# Patient Record
Sex: Female | Born: 1960 | Race: White | Hispanic: No | Marital: Married | Smoking: Former smoker
Health system: Southern US, Community
[De-identification: ages and names within clinical notes are randomized; demographics above are authoritative.]

## PROBLEM LIST (undated history)

## (undated) DIAGNOSIS — E669 Obesity, unspecified: Secondary | ICD-10-CM

## (undated) DIAGNOSIS — Z8052 Family history of malignant neoplasm of bladder: Secondary | ICD-10-CM

## (undated) DIAGNOSIS — R202 Paresthesia of skin: Secondary | ICD-10-CM

## (undated) DIAGNOSIS — G8929 Other chronic pain: Secondary | ICD-10-CM

## (undated) DIAGNOSIS — C801 Malignant (primary) neoplasm, unspecified: Secondary | ICD-10-CM

## (undated) DIAGNOSIS — R252 Cramp and spasm: Secondary | ICD-10-CM

## (undated) DIAGNOSIS — N809 Endometriosis, unspecified: Secondary | ICD-10-CM

## (undated) DIAGNOSIS — M79606 Pain in leg, unspecified: Secondary | ICD-10-CM

## (undated) DIAGNOSIS — D6959 Other secondary thrombocytopenia: Secondary | ICD-10-CM

## (undated) DIAGNOSIS — Z803 Family history of malignant neoplasm of breast: Secondary | ICD-10-CM

## (undated) DIAGNOSIS — G549 Nerve root and plexus disorder, unspecified: Secondary | ICD-10-CM

## (undated) DIAGNOSIS — C50919 Malignant neoplasm of unspecified site of unspecified female breast: Secondary | ICD-10-CM

## (undated) HISTORY — DX: Pain in leg, unspecified: M79.606

## (undated) HISTORY — DX: Cramp and spasm: R25.2

## (undated) HISTORY — DX: Obesity, unspecified: E66.9

## (undated) HISTORY — DX: Family history of malignant neoplasm of breast: Z80.3

## (undated) HISTORY — PX: LAPAROSCOPY: SHX197

## (undated) HISTORY — DX: Nerve root and plexus disorder, unspecified: G54.9

## (undated) HISTORY — DX: Endometriosis, unspecified: N80.9

## (undated) HISTORY — DX: Other secondary thrombocytopenia: D69.59

## (undated) HISTORY — DX: Family history of malignant neoplasm of bladder: Z80.52

## (undated) HISTORY — DX: Paresthesia of skin: R20.2

## (undated) HISTORY — DX: Other chronic pain: G89.29

---

## 1993-01-07 HISTORY — PX: HERNIA REPAIR: SHX51

## 1994-01-07 HISTORY — PX: HERNIA REPAIR: SHX51

## 1997-07-29 ENCOUNTER — Ambulatory Visit (HOSPITAL_COMMUNITY): Admission: RE | Admit: 1997-07-29 | Discharge: 1997-07-29 | Payer: Self-pay | Admitting: Obstetrics and Gynecology

## 1998-05-08 ENCOUNTER — Other Ambulatory Visit: Admission: RE | Admit: 1998-05-08 | Discharge: 1998-05-08 | Payer: Self-pay | Admitting: Gynecology

## 1999-08-10 ENCOUNTER — Encounter (INDEPENDENT_AMBULATORY_CARE_PROVIDER_SITE_OTHER): Payer: Self-pay | Admitting: Specialist

## 1999-08-10 ENCOUNTER — Other Ambulatory Visit: Admission: RE | Admit: 1999-08-10 | Discharge: 1999-08-10 | Payer: Self-pay | Admitting: Gynecology

## 1999-11-08 ENCOUNTER — Other Ambulatory Visit: Admission: RE | Admit: 1999-11-08 | Discharge: 1999-11-08 | Payer: Self-pay | Admitting: Gynecology

## 2000-10-06 ENCOUNTER — Encounter: Admission: RE | Admit: 2000-10-06 | Discharge: 2000-10-06 | Payer: Self-pay | Admitting: Specialist

## 2000-10-06 ENCOUNTER — Encounter: Payer: Self-pay | Admitting: Specialist

## 2001-04-08 ENCOUNTER — Other Ambulatory Visit: Admission: RE | Admit: 2001-04-08 | Discharge: 2001-04-08 | Payer: Self-pay | Admitting: Family Medicine

## 2004-02-21 ENCOUNTER — Ambulatory Visit: Payer: Self-pay | Admitting: Family Medicine

## 2004-02-21 ENCOUNTER — Other Ambulatory Visit: Admission: RE | Admit: 2004-02-21 | Discharge: 2004-02-21 | Payer: Self-pay | Admitting: Family Medicine

## 2004-03-15 ENCOUNTER — Ambulatory Visit: Payer: Self-pay | Admitting: Family Medicine

## 2004-11-09 ENCOUNTER — Ambulatory Visit: Payer: Self-pay | Admitting: Family Medicine

## 2004-11-12 ENCOUNTER — Encounter: Admission: RE | Admit: 2004-11-12 | Discharge: 2004-11-12 | Payer: Self-pay | Admitting: Family Medicine

## 2005-01-29 ENCOUNTER — Ambulatory Visit: Payer: Self-pay | Admitting: Family Medicine

## 2005-02-07 ENCOUNTER — Ambulatory Visit: Payer: Self-pay | Admitting: Family Medicine

## 2006-09-24 ENCOUNTER — Encounter: Admission: RE | Admit: 2006-09-24 | Discharge: 2006-09-24 | Payer: Self-pay | Admitting: Obstetrics & Gynecology

## 2007-04-06 ENCOUNTER — Ambulatory Visit: Payer: Self-pay | Admitting: Family Medicine

## 2007-04-06 DIAGNOSIS — J111 Influenza due to unidentified influenza virus with other respiratory manifestations: Secondary | ICD-10-CM | POA: Insufficient documentation

## 2007-04-06 LAB — CONVERTED CEMR LAB: Inflenza A Ag: POSITIVE

## 2007-04-07 ENCOUNTER — Telehealth (INDEPENDENT_AMBULATORY_CARE_PROVIDER_SITE_OTHER): Payer: Self-pay | Admitting: *Deleted

## 2007-04-15 ENCOUNTER — Ambulatory Visit: Payer: Self-pay | Admitting: Internal Medicine

## 2007-04-15 DIAGNOSIS — J019 Acute sinusitis, unspecified: Secondary | ICD-10-CM

## 2007-10-02 ENCOUNTER — Encounter: Admission: RE | Admit: 2007-10-02 | Discharge: 2007-10-02 | Payer: Self-pay | Admitting: Obstetrics & Gynecology

## 2007-10-19 ENCOUNTER — Encounter: Payer: Self-pay | Admitting: Family Medicine

## 2007-10-26 ENCOUNTER — Encounter: Payer: Self-pay | Admitting: Family Medicine

## 2008-01-08 HISTORY — PX: BREAST LUMPECTOMY: SHX2

## 2008-01-27 ENCOUNTER — Encounter: Payer: Self-pay | Admitting: Family Medicine

## 2008-02-03 ENCOUNTER — Ambulatory Visit: Payer: Self-pay | Admitting: Family Medicine

## 2008-02-03 DIAGNOSIS — B070 Plantar wart: Secondary | ICD-10-CM

## 2008-02-03 DIAGNOSIS — IMO0001 Reserved for inherently not codable concepts without codable children: Secondary | ICD-10-CM

## 2008-02-03 DIAGNOSIS — B009 Herpesviral infection, unspecified: Secondary | ICD-10-CM | POA: Insufficient documentation

## 2008-02-09 ENCOUNTER — Telehealth (INDEPENDENT_AMBULATORY_CARE_PROVIDER_SITE_OTHER): Payer: Self-pay | Admitting: *Deleted

## 2008-02-10 ENCOUNTER — Encounter (INDEPENDENT_AMBULATORY_CARE_PROVIDER_SITE_OTHER): Payer: Self-pay | Admitting: *Deleted

## 2008-02-19 ENCOUNTER — Telehealth (INDEPENDENT_AMBULATORY_CARE_PROVIDER_SITE_OTHER): Payer: Self-pay | Admitting: *Deleted

## 2008-02-19 ENCOUNTER — Encounter: Payer: Self-pay | Admitting: Family Medicine

## 2008-02-22 ENCOUNTER — Telehealth (INDEPENDENT_AMBULATORY_CARE_PROVIDER_SITE_OTHER): Payer: Self-pay | Admitting: *Deleted

## 2008-03-18 ENCOUNTER — Ambulatory Visit: Payer: Self-pay | Admitting: Family Medicine

## 2008-03-20 DIAGNOSIS — D696 Thrombocytopenia, unspecified: Secondary | ICD-10-CM

## 2008-03-20 LAB — CONVERTED CEMR LAB
Basophils Absolute: 0.1 10*3/uL (ref 0.0–0.1)
Eosinophils Absolute: 0.4 10*3/uL (ref 0.0–0.7)
HCT: 39.5 % (ref 36.0–46.0)
MCHC: 33.4 g/dL (ref 30.0–36.0)
MCV: 99.3 fL (ref 78.0–100.0)
Magnesium: 2.2 mg/dL (ref 1.5–2.5)
Monocytes Absolute: 0.2 10*3/uL (ref 0.1–1.0)
Platelets: 117 10*3/uL — ABNORMAL LOW (ref 150–400)
RDW: 11.6 % (ref 11.5–14.6)

## 2008-03-21 LAB — CONVERTED CEMR LAB: Vit D, 25-Hydroxy: 46 ng/mL (ref 30–89)

## 2008-03-22 ENCOUNTER — Telehealth (INDEPENDENT_AMBULATORY_CARE_PROVIDER_SITE_OTHER): Payer: Self-pay | Admitting: *Deleted

## 2008-03-22 ENCOUNTER — Encounter (INDEPENDENT_AMBULATORY_CARE_PROVIDER_SITE_OTHER): Payer: Self-pay | Admitting: *Deleted

## 2008-03-25 ENCOUNTER — Ambulatory Visit: Payer: Self-pay | Admitting: Internal Medicine

## 2008-03-28 ENCOUNTER — Encounter (INDEPENDENT_AMBULATORY_CARE_PROVIDER_SITE_OTHER): Payer: Self-pay | Admitting: *Deleted

## 2008-04-13 ENCOUNTER — Encounter: Payer: Self-pay | Admitting: Family Medicine

## 2008-04-13 LAB — CBC WITH DIFFERENTIAL/PLATELET
BASO%: 0.7 % (ref 0.0–2.0)
Basophils Absolute: 0 10*3/uL (ref 0.0–0.1)
EOS%: 3.8 % (ref 0.0–7.0)
Eosinophils Absolute: 0.2 10*3/uL (ref 0.0–0.5)
HCT: 36 % (ref 34.8–46.6)
HGB: 12.5 g/dL (ref 11.6–15.9)
LYMPH%: 41.5 % (ref 14.0–49.7)
MCH: 32.2 pg (ref 25.1–34.0)
MCHC: 34.7 g/dL (ref 31.5–36.0)
MCV: 92.8 fL (ref 79.5–101.0)
MONO#: 0.3 10*3/uL (ref 0.1–0.9)
MONO%: 5.4 % (ref 0.0–14.0)
NEUT#: 2.8 10*3/uL (ref 1.5–6.5)
NEUT%: 48.6 % (ref 38.4–76.8)
Platelets: 141 10*3/uL — ABNORMAL LOW (ref 145–400)
RBC: 3.88 10*6/uL (ref 3.70–5.45)
RDW: 12 % (ref 11.2–14.5)
WBC: 5.7 10*3/uL (ref 3.9–10.3)
lymph#: 2.4 10*3/uL (ref 0.9–3.3)

## 2008-04-13 LAB — COMPREHENSIVE METABOLIC PANEL
ALT: 12 U/L (ref 0–35)
AST: 13 U/L (ref 0–37)
Albumin: 4 g/dL (ref 3.5–5.2)
Alkaline Phosphatase: 48 U/L (ref 39–117)
BUN: 9 mg/dL (ref 6–23)
CO2: 27 mEq/L (ref 19–32)
Calcium: 8.9 mg/dL (ref 8.4–10.5)
Chloride: 106 mEq/L (ref 96–112)
Creatinine, Ser: 0.79 mg/dL (ref 0.40–1.20)
Glucose, Bld: 89 mg/dL (ref 70–99)
Potassium: 4.2 mEq/L (ref 3.5–5.3)
Sodium: 140 mEq/L (ref 135–145)
Total Bilirubin: 0.5 mg/dL (ref 0.3–1.2)
Total Protein: 6.2 g/dL (ref 6.0–8.3)

## 2008-04-21 ENCOUNTER — Encounter: Admission: RE | Admit: 2008-04-21 | Discharge: 2008-04-21 | Payer: Self-pay | Admitting: Obstetrics & Gynecology

## 2008-04-29 ENCOUNTER — Encounter: Admission: RE | Admit: 2008-04-29 | Discharge: 2008-04-29 | Payer: Self-pay | Admitting: Obstetrics & Gynecology

## 2008-04-29 ENCOUNTER — Encounter (INDEPENDENT_AMBULATORY_CARE_PROVIDER_SITE_OTHER): Payer: Self-pay | Admitting: Obstetrics & Gynecology

## 2008-05-04 ENCOUNTER — Encounter: Admission: RE | Admit: 2008-05-04 | Discharge: 2008-05-04 | Payer: Self-pay | Admitting: Obstetrics & Gynecology

## 2008-05-04 ENCOUNTER — Encounter: Payer: Self-pay | Admitting: Family Medicine

## 2008-05-06 ENCOUNTER — Encounter: Admission: RE | Admit: 2008-05-06 | Discharge: 2008-05-06 | Payer: Self-pay | Admitting: Obstetrics & Gynecology

## 2008-05-06 ENCOUNTER — Encounter: Payer: Self-pay | Admitting: Family Medicine

## 2008-05-07 HISTORY — PX: BREAST SURGERY: SHX581

## 2008-05-07 HISTORY — PX: CHOLECYSTECTOMY: SHX55

## 2008-05-12 ENCOUNTER — Ambulatory Visit: Payer: Self-pay | Admitting: Family Medicine

## 2008-05-12 DIAGNOSIS — C50919 Malignant neoplasm of unspecified site of unspecified female breast: Secondary | ICD-10-CM | POA: Insufficient documentation

## 2008-05-16 ENCOUNTER — Encounter: Admission: RE | Admit: 2008-05-16 | Discharge: 2008-05-16 | Payer: Self-pay | Admitting: General Surgery

## 2008-05-16 ENCOUNTER — Encounter: Payer: Self-pay | Admitting: Family Medicine

## 2008-05-16 ENCOUNTER — Ambulatory Visit (HOSPITAL_BASED_OUTPATIENT_CLINIC_OR_DEPARTMENT_OTHER): Admission: RE | Admit: 2008-05-16 | Discharge: 2008-05-16 | Payer: Self-pay | Admitting: General Surgery

## 2008-05-16 ENCOUNTER — Encounter (INDEPENDENT_AMBULATORY_CARE_PROVIDER_SITE_OTHER): Payer: Self-pay | Admitting: General Surgery

## 2008-05-21 ENCOUNTER — Telehealth: Payer: Self-pay | Admitting: Family Medicine

## 2008-05-23 ENCOUNTER — Encounter: Admission: RE | Admit: 2008-05-23 | Discharge: 2008-05-23 | Payer: Self-pay | Admitting: General Surgery

## 2008-05-23 ENCOUNTER — Ambulatory Visit: Payer: Self-pay | Admitting: Family Medicine

## 2008-05-23 DIAGNOSIS — K8020 Calculus of gallbladder without cholecystitis without obstruction: Secondary | ICD-10-CM | POA: Insufficient documentation

## 2008-05-24 ENCOUNTER — Ambulatory Visit: Payer: Self-pay | Admitting: Genetic Counselor

## 2008-05-26 ENCOUNTER — Encounter: Admission: RE | Admit: 2008-05-26 | Discharge: 2008-05-26 | Payer: Self-pay | Admitting: Neurology

## 2008-06-01 ENCOUNTER — Encounter (INDEPENDENT_AMBULATORY_CARE_PROVIDER_SITE_OTHER): Payer: Self-pay | Admitting: General Surgery

## 2008-06-01 ENCOUNTER — Ambulatory Visit (HOSPITAL_COMMUNITY): Admission: RE | Admit: 2008-06-01 | Discharge: 2008-06-01 | Payer: Self-pay | Admitting: General Surgery

## 2008-06-07 HISTORY — PX: OTHER SURGICAL HISTORY: SHX169

## 2008-06-08 ENCOUNTER — Encounter: Payer: Self-pay | Admitting: Family Medicine

## 2008-06-08 ENCOUNTER — Encounter: Payer: Self-pay | Admitting: Internal Medicine

## 2008-06-08 ENCOUNTER — Ambulatory Visit: Payer: Self-pay | Admitting: Oncology

## 2008-06-08 LAB — COMPREHENSIVE METABOLIC PANEL
ALT: 23 U/L (ref 0–35)
AST: 20 U/L (ref 0–37)
Albumin: 4 g/dL (ref 3.5–5.2)
CO2: 33 mEq/L — ABNORMAL HIGH (ref 19–32)
Calcium: 9.3 mg/dL (ref 8.4–10.5)
Chloride: 105 mEq/L (ref 96–112)
Potassium: 4 mEq/L (ref 3.5–5.3)
Total Protein: 6.8 g/dL (ref 6.0–8.3)

## 2008-06-09 LAB — CANCER ANTIGEN 27.29: CA 27.29: 34 U/mL (ref 0–39)

## 2008-06-14 ENCOUNTER — Encounter: Payer: Self-pay | Admitting: Oncology

## 2008-06-14 ENCOUNTER — Ambulatory Visit: Admission: RE | Admit: 2008-06-14 | Discharge: 2008-06-14 | Payer: Self-pay | Admitting: Oncology

## 2008-06-14 ENCOUNTER — Encounter: Payer: Self-pay | Admitting: Family Medicine

## 2008-06-14 ENCOUNTER — Encounter: Admission: RE | Admit: 2008-06-14 | Discharge: 2008-06-14 | Payer: Self-pay | Admitting: Oncology

## 2008-06-16 ENCOUNTER — Ambulatory Visit (HOSPITAL_COMMUNITY): Admission: RE | Admit: 2008-06-16 | Discharge: 2008-06-16 | Payer: Self-pay | Admitting: General Surgery

## 2008-06-27 DIAGNOSIS — R109 Unspecified abdominal pain: Secondary | ICD-10-CM | POA: Insufficient documentation

## 2008-06-27 DIAGNOSIS — D259 Leiomyoma of uterus, unspecified: Secondary | ICD-10-CM | POA: Insufficient documentation

## 2008-06-30 ENCOUNTER — Ambulatory Visit (HOSPITAL_COMMUNITY): Admission: RE | Admit: 2008-06-30 | Discharge: 2008-06-30 | Payer: Self-pay | Admitting: Obstetrics & Gynecology

## 2008-06-30 ENCOUNTER — Encounter (INDEPENDENT_AMBULATORY_CARE_PROVIDER_SITE_OTHER): Payer: Self-pay | Admitting: Obstetrics & Gynecology

## 2008-07-01 ENCOUNTER — Ambulatory Visit: Payer: Self-pay | Admitting: Internal Medicine

## 2008-07-01 DIAGNOSIS — F411 Generalized anxiety disorder: Secondary | ICD-10-CM | POA: Insufficient documentation

## 2008-07-06 ENCOUNTER — Encounter: Admission: RE | Admit: 2008-07-06 | Discharge: 2008-07-06 | Payer: Self-pay | Admitting: Neurology

## 2008-07-07 LAB — CBC WITH DIFFERENTIAL/PLATELET
BASO%: 0.6 % (ref 0.0–2.0)
Eosinophils Absolute: 0.3 10*3/uL (ref 0.0–0.5)
LYMPH%: 19.2 % (ref 14.0–49.7)
MCHC: 34.6 g/dL (ref 31.5–36.0)
MONO#: 0.6 10*3/uL (ref 0.1–0.9)
NEUT#: 6.3 10*3/uL (ref 1.5–6.5)
Platelets: 195 10*3/uL (ref 145–400)
RBC: 4.23 10*6/uL (ref 3.70–5.45)
RDW: 12 % (ref 11.2–14.5)
WBC: 9 10*3/uL (ref 3.9–10.3)

## 2008-07-07 LAB — COMPREHENSIVE METABOLIC PANEL
ALT: 20 U/L (ref 0–35)
Albumin: 4.1 g/dL (ref 3.5–5.2)
Alkaline Phosphatase: 72 U/L (ref 39–117)
CO2: 29 mEq/L (ref 19–32)
Glucose, Bld: 99 mg/dL (ref 70–99)
Potassium: 4 mEq/L (ref 3.5–5.3)
Sodium: 136 mEq/L (ref 135–145)
Total Bilirubin: 0.5 mg/dL (ref 0.3–1.2)
Total Protein: 7 g/dL (ref 6.0–8.3)

## 2008-07-07 LAB — CANCER ANTIGEN 27.29: CA 27.29: 35 U/mL (ref 0–39)

## 2008-07-08 ENCOUNTER — Ambulatory Visit: Payer: Self-pay | Admitting: Oncology

## 2008-07-12 ENCOUNTER — Emergency Department (HOSPITAL_COMMUNITY): Admission: EM | Admit: 2008-07-12 | Discharge: 2008-07-12 | Payer: Self-pay | Admitting: Emergency Medicine

## 2008-07-13 ENCOUNTER — Encounter: Payer: Self-pay | Admitting: Internal Medicine

## 2008-07-20 ENCOUNTER — Encounter: Payer: Self-pay | Admitting: Internal Medicine

## 2008-07-20 ENCOUNTER — Ambulatory Visit: Payer: Self-pay | Admitting: Psychiatry

## 2008-07-20 LAB — CBC WITH DIFFERENTIAL/PLATELET
BASO%: 0.2 % (ref 0.0–2.0)
EOS%: 1.7 % (ref 0.0–7.0)
HCT: 39.1 % (ref 34.8–46.6)
LYMPH%: 11.2 % — ABNORMAL LOW (ref 14.0–49.7)
MCH: 32.4 pg (ref 25.1–34.0)
MCHC: 33.5 g/dL (ref 31.5–36.0)
NEUT%: 83.8 % — ABNORMAL HIGH (ref 38.4–76.8)
Platelets: 189 10*3/uL (ref 145–400)
RBC: 4.04 10*6/uL (ref 3.70–5.45)

## 2008-07-21 ENCOUNTER — Ambulatory Visit: Payer: Self-pay | Admitting: Psychiatry

## 2008-08-02 ENCOUNTER — Ambulatory Visit: Payer: Self-pay | Admitting: Psychiatry

## 2008-08-03 ENCOUNTER — Encounter: Payer: Self-pay | Admitting: Internal Medicine

## 2008-08-03 LAB — CBC WITH DIFFERENTIAL/PLATELET
BASO%: 0.7 % (ref 0.0–2.0)
Basophils Absolute: 0 10e3/uL (ref 0.0–0.1)
EOS%: 0.6 % (ref 0.0–7.0)
Eosinophils Absolute: 0 10e3/uL (ref 0.0–0.5)
HCT: 34.8 % (ref 34.8–46.6)
HGB: 12 g/dL (ref 11.6–15.9)
LYMPH%: 23.7 % (ref 14.0–49.7)
MCH: 33.2 pg (ref 25.1–34.0)
MCHC: 34.4 g/dL (ref 31.5–36.0)
MCV: 96.4 fL (ref 79.5–101.0)
MONO#: 0.5 10e3/uL (ref 0.1–0.9)
MONO%: 7.7 % (ref 0.0–14.0)
NEUT#: 4.5 10e3/uL (ref 1.5–6.5)
NEUT%: 67.3 % (ref 38.4–76.8)
Platelets: 140 10e3/uL — ABNORMAL LOW (ref 145–400)
RBC: 3.62 10e6/uL — ABNORMAL LOW (ref 3.70–5.45)
RDW: 12.8 % (ref 11.2–14.5)
WBC: 6.7 10e3/uL (ref 3.9–10.3)
lymph#: 1.6 10e3/uL (ref 0.9–3.3)

## 2008-08-03 LAB — COMPREHENSIVE METABOLIC PANEL
Albumin: 3.8 g/dL (ref 3.5–5.2)
BUN: 11 mg/dL (ref 6–23)
Calcium: 9 mg/dL (ref 8.4–10.5)
Chloride: 103 mEq/L (ref 96–112)
Creatinine, Ser: 0.8 mg/dL (ref 0.40–1.20)
Glucose, Bld: 84 mg/dL (ref 70–99)
Potassium: 4.1 mEq/L (ref 3.5–5.3)

## 2008-08-09 ENCOUNTER — Ambulatory Visit: Payer: Self-pay | Admitting: Psychiatry

## 2008-08-09 ENCOUNTER — Ambulatory Visit: Payer: Self-pay | Admitting: Oncology

## 2008-08-11 LAB — CBC WITH DIFFERENTIAL/PLATELET
BASO%: 0.5 % (ref 0.0–2.0)
LYMPH%: 33.5 % (ref 14.0–49.7)
MCHC: 34.6 g/dL (ref 31.5–36.0)
MONO#: 0.6 10*3/uL (ref 0.1–0.9)
MONO%: 14.9 % — ABNORMAL HIGH (ref 0.0–14.0)
Platelets: 104 10*3/uL — ABNORMAL LOW (ref 145–400)
RBC: 3.31 10*6/uL — ABNORMAL LOW (ref 3.70–5.45)
WBC: 3.9 10*3/uL (ref 3.9–10.3)

## 2008-08-16 ENCOUNTER — Encounter: Payer: Self-pay | Admitting: Family Medicine

## 2008-08-16 ENCOUNTER — Ambulatory Visit: Payer: Self-pay | Admitting: Psychiatry

## 2008-08-23 ENCOUNTER — Ambulatory Visit: Payer: Self-pay | Admitting: Psychiatry

## 2008-08-24 ENCOUNTER — Encounter: Payer: Self-pay | Admitting: Internal Medicine

## 2008-08-24 LAB — COMPREHENSIVE METABOLIC PANEL
ALT: 26 U/L (ref 0–35)
Alkaline Phosphatase: 73 U/L (ref 39–117)
CO2: 25 mEq/L (ref 19–32)
Creatinine, Ser: 0.91 mg/dL (ref 0.40–1.20)
Total Bilirubin: 0.4 mg/dL (ref 0.3–1.2)

## 2008-08-24 LAB — CBC WITH DIFFERENTIAL/PLATELET
BASO%: 0.4 % (ref 0.0–2.0)
HCT: 34.5 % — ABNORMAL LOW (ref 34.8–46.6)
MCHC: 34.1 g/dL (ref 31.5–36.0)
MONO#: 0.3 10*3/uL (ref 0.1–0.9)
RBC: 3.48 10*6/uL — ABNORMAL LOW (ref 3.70–5.45)
WBC: 4.4 10*3/uL (ref 3.9–10.3)
lymph#: 1.2 10*3/uL (ref 0.9–3.3)

## 2008-09-01 ENCOUNTER — Ambulatory Visit: Payer: Self-pay | Admitting: Psychiatry

## 2008-09-01 ENCOUNTER — Encounter: Payer: Self-pay | Admitting: Internal Medicine

## 2008-09-01 LAB — COMPREHENSIVE METABOLIC PANEL
Albumin: 3.7 g/dL (ref 3.5–5.2)
CO2: 30 mEq/L (ref 19–32)
Glucose, Bld: 95 mg/dL (ref 70–99)
Potassium: 3.9 mEq/L (ref 3.5–5.3)
Sodium: 137 mEq/L (ref 135–145)
Total Protein: 6.4 g/dL (ref 6.0–8.3)

## 2008-09-01 LAB — CBC WITH DIFFERENTIAL/PLATELET
BASO%: 0.3 % (ref 0.0–2.0)
Eosinophils Absolute: 0.1 10*3/uL (ref 0.0–0.5)
HCT: 32.8 % — ABNORMAL LOW (ref 34.8–46.6)
LYMPH%: 18.2 % (ref 14.0–49.7)
MCHC: 34.7 g/dL (ref 31.5–36.0)
MCV: 98.8 fL (ref 79.5–101.0)
MONO#: 1.5 10*3/uL — ABNORMAL HIGH (ref 0.1–0.9)
MONO%: 12.7 % (ref 0.0–14.0)
NEUT%: 68.3 % (ref 38.4–76.8)
Platelets: 203 10*3/uL (ref 145–400)
WBC: 11.6 10*3/uL — ABNORMAL HIGH (ref 3.9–10.3)

## 2008-09-08 ENCOUNTER — Ambulatory Visit: Payer: Self-pay | Admitting: Oncology

## 2008-09-08 ENCOUNTER — Encounter: Payer: Self-pay | Admitting: Internal Medicine

## 2008-09-08 LAB — CBC WITH DIFFERENTIAL/PLATELET
BASO%: 0.3 % (ref 0.0–2.0)
Basophils Absolute: 0 10*3/uL (ref 0.0–0.1)
EOS%: 0.1 % (ref 0.0–7.0)
HCT: 33 % — ABNORMAL LOW (ref 34.8–46.6)
LYMPH%: 17.2 % (ref 14.0–49.7)
MCH: 34 pg (ref 25.1–34.0)
MCHC: 34.3 g/dL (ref 31.5–36.0)
MCV: 99.1 fL (ref 79.5–101.0)
NEUT%: 78 % — ABNORMAL HIGH (ref 38.4–76.8)
Platelets: 128 10*3/uL — ABNORMAL LOW (ref 145–400)

## 2008-09-09 LAB — COMPREHENSIVE METABOLIC PANEL
ALT: 27 U/L (ref 0–35)
AST: 27 U/L (ref 0–37)
BUN: 9 mg/dL (ref 6–23)
Creatinine, Ser: 0.89 mg/dL (ref 0.40–1.20)
Total Bilirubin: 0.2 mg/dL — ABNORMAL LOW (ref 0.3–1.2)

## 2008-09-13 ENCOUNTER — Ambulatory Visit: Payer: Self-pay | Admitting: Psychiatry

## 2008-09-15 ENCOUNTER — Encounter: Payer: Self-pay | Admitting: Family Medicine

## 2008-09-15 LAB — CBC WITH DIFFERENTIAL/PLATELET
BASO%: 0.2 % (ref 0.0–2.0)
Basophils Absolute: 0 10*3/uL (ref 0.0–0.1)
HCT: 32.9 % — ABNORMAL LOW (ref 34.8–46.6)
HGB: 11.3 g/dL — ABNORMAL LOW (ref 11.6–15.9)
LYMPH%: 13.2 % — ABNORMAL LOW (ref 14.0–49.7)
MCH: 32.9 pg (ref 25.1–34.0)
MCHC: 34.3 g/dL (ref 31.5–36.0)
MONO#: 0.2 10*3/uL (ref 0.1–0.9)
NEUT%: 83.9 % — ABNORMAL HIGH (ref 38.4–76.8)
Platelets: 142 10*3/uL — ABNORMAL LOW (ref 145–400)
WBC: 8.8 10*3/uL (ref 3.9–10.3)
lymph#: 1.2 10*3/uL (ref 0.9–3.3)

## 2008-09-15 LAB — COMPREHENSIVE METABOLIC PANEL
ALT: 23 U/L (ref 0–35)
BUN: 17 mg/dL (ref 6–23)
CO2: 28 mEq/L (ref 19–32)
Calcium: 9 mg/dL (ref 8.4–10.5)
Chloride: 106 mEq/L (ref 96–112)
Creatinine, Ser: 0.87 mg/dL (ref 0.40–1.20)
Total Bilirubin: 0.5 mg/dL (ref 0.3–1.2)

## 2008-09-19 ENCOUNTER — Encounter: Payer: Self-pay | Admitting: Family Medicine

## 2008-09-21 ENCOUNTER — Encounter: Payer: Self-pay | Admitting: Family Medicine

## 2008-09-21 ENCOUNTER — Encounter: Payer: Self-pay | Admitting: Internal Medicine

## 2008-09-21 LAB — COMPREHENSIVE METABOLIC PANEL
ALT: 27 U/L (ref 0–35)
CO2: 30 mEq/L (ref 19–32)
Calcium: 9.1 mg/dL (ref 8.4–10.5)
Chloride: 103 mEq/L (ref 96–112)
Creatinine, Ser: 0.74 mg/dL (ref 0.40–1.20)
Glucose, Bld: 88 mg/dL (ref 70–99)
Total Bilirubin: 0.6 mg/dL (ref 0.3–1.2)

## 2008-09-21 LAB — CBC WITH DIFFERENTIAL/PLATELET
BASO%: 0.4 % (ref 0.0–2.0)
Basophils Absolute: 0 10*3/uL (ref 0.0–0.1)
Eosinophils Absolute: 0.1 10*3/uL (ref 0.0–0.5)
HCT: 32.2 % — ABNORMAL LOW (ref 34.8–46.6)
HGB: 11.1 g/dL — ABNORMAL LOW (ref 11.6–15.9)
LYMPH%: 26.1 % (ref 14.0–49.7)
MONO#: 0.2 10*3/uL (ref 0.1–0.9)
NEUT#: 3.4 10*3/uL (ref 1.5–6.5)
NEUT%: 68.1 % (ref 38.4–76.8)
Platelets: 76 10*3/uL — ABNORMAL LOW (ref 145–400)
WBC: 5 10*3/uL (ref 3.9–10.3)
lymph#: 1.3 10*3/uL (ref 0.9–3.3)

## 2008-09-27 ENCOUNTER — Ambulatory Visit: Payer: Self-pay | Admitting: Psychiatry

## 2008-09-28 ENCOUNTER — Encounter: Payer: Self-pay | Admitting: Internal Medicine

## 2008-09-28 LAB — COMPREHENSIVE METABOLIC PANEL
ALT: 25 U/L (ref 0–35)
CO2: 30 mEq/L (ref 19–32)
Calcium: 9 mg/dL (ref 8.4–10.5)
Chloride: 105 mEq/L (ref 96–112)
Glucose, Bld: 88 mg/dL (ref 70–99)
Sodium: 140 mEq/L (ref 135–145)
Total Protein: 6.2 g/dL (ref 6.0–8.3)

## 2008-09-28 LAB — CBC WITH DIFFERENTIAL/PLATELET
BASO%: 0.2 % (ref 0.0–2.0)
Eosinophils Absolute: 0 10*3/uL (ref 0.0–0.5)
HCT: 31.9 % — ABNORMAL LOW (ref 34.8–46.6)
MCHC: 34.2 g/dL (ref 31.5–36.0)
MONO#: 0.4 10*3/uL (ref 0.1–0.9)
NEUT#: 6.2 10*3/uL (ref 1.5–6.5)
NEUT%: 75.3 % (ref 38.4–76.8)
RBC: 3.14 10*6/uL — ABNORMAL LOW (ref 3.70–5.45)
WBC: 8.3 10*3/uL (ref 3.9–10.3)
lymph#: 1.6 10*3/uL (ref 0.9–3.3)

## 2008-10-04 ENCOUNTER — Ambulatory Visit: Payer: Self-pay | Admitting: Oncology

## 2008-10-06 ENCOUNTER — Encounter: Payer: Self-pay | Admitting: Internal Medicine

## 2008-10-06 LAB — CBC WITH DIFFERENTIAL/PLATELET
BASO%: 0 % (ref 0.0–2.0)
EOS%: 0.1 % (ref 0.0–7.0)
Eosinophils Absolute: 0 10*3/uL (ref 0.0–0.5)
LYMPH%: 12.3 % — ABNORMAL LOW (ref 14.0–49.7)
MCH: 35.8 pg — ABNORMAL HIGH (ref 25.1–34.0)
MCHC: 34.8 g/dL (ref 31.5–36.0)
MCV: 102.9 fL — ABNORMAL HIGH (ref 79.5–101.0)
MONO%: 2.6 % (ref 0.0–14.0)
Platelets: 140 10*3/uL — ABNORMAL LOW (ref 145–400)
RBC: 3.38 10*6/uL — ABNORMAL LOW (ref 3.70–5.45)

## 2008-10-06 LAB — COMPREHENSIVE METABOLIC PANEL
ALT: 21 U/L (ref 0–35)
AST: 22 U/L (ref 0–37)
Alkaline Phosphatase: 67 U/L (ref 39–117)
Sodium: 137 mEq/L (ref 135–145)
Total Bilirubin: 0.7 mg/dL (ref 0.3–1.2)
Total Protein: 6.7 g/dL (ref 6.0–8.3)

## 2008-10-11 ENCOUNTER — Encounter: Payer: Self-pay | Admitting: Oncology

## 2008-10-11 ENCOUNTER — Ambulatory Visit: Payer: Self-pay | Admitting: Cardiology

## 2008-10-11 ENCOUNTER — Ambulatory Visit (HOSPITAL_COMMUNITY): Admission: RE | Admit: 2008-10-11 | Discharge: 2008-10-11 | Payer: Self-pay | Admitting: Cardiology

## 2008-10-11 ENCOUNTER — Ambulatory Visit: Payer: Self-pay

## 2008-10-12 LAB — CBC WITH DIFFERENTIAL/PLATELET
BASO%: 0.3 % (ref 0.0–2.0)
EOS%: 1.4 % (ref 0.0–7.0)
HCT: 32.4 % — ABNORMAL LOW (ref 34.8–46.6)
LYMPH%: 22.2 % (ref 14.0–49.7)
MCH: 36.3 pg — ABNORMAL HIGH (ref 25.1–34.0)
MCHC: 34.8 g/dL (ref 31.5–36.0)
NEUT%: 67.9 % (ref 38.4–76.8)
Platelets: 152 10*3/uL (ref 145–400)

## 2008-10-12 LAB — COMPREHENSIVE METABOLIC PANEL
ALT: 24 U/L (ref 0–35)
AST: 20 U/L (ref 0–37)
Creatinine, Ser: 0.78 mg/dL (ref 0.40–1.20)
Total Bilirubin: 0.5 mg/dL (ref 0.3–1.2)

## 2008-10-18 ENCOUNTER — Ambulatory Visit: Admission: RE | Admit: 2008-10-18 | Discharge: 2008-12-16 | Payer: Self-pay | Admitting: Radiation Oncology

## 2008-10-18 ENCOUNTER — Ambulatory Visit: Payer: Self-pay | Admitting: Psychiatry

## 2008-10-19 ENCOUNTER — Encounter: Payer: Self-pay | Admitting: Internal Medicine

## 2008-10-19 LAB — CBC WITH DIFFERENTIAL/PLATELET
BASO%: 0.2 % (ref 0.0–2.0)
EOS%: 0.3 % (ref 0.0–7.0)
HCT: 34.5 % — ABNORMAL LOW (ref 34.8–46.6)
LYMPH%: 16.9 % (ref 14.0–49.7)
MCH: 35.5 pg — ABNORMAL HIGH (ref 25.1–34.0)
MCHC: 34 g/dL (ref 31.5–36.0)
MONO#: 0.6 10*3/uL (ref 0.1–0.9)
NEUT%: 78.2 % — ABNORMAL HIGH (ref 38.4–76.8)
Platelets: 221 10*3/uL (ref 145–400)

## 2008-10-25 ENCOUNTER — Ambulatory Visit: Payer: Self-pay | Admitting: Internal Medicine

## 2008-10-27 LAB — COMPREHENSIVE METABOLIC PANEL
ALT: 20 U/L (ref 0–35)
AST: 21 U/L (ref 0–37)
Alkaline Phosphatase: 69 U/L (ref 39–117)
Calcium: 9.1 mg/dL (ref 8.4–10.5)
Chloride: 106 mEq/L (ref 96–112)
Creatinine, Ser: 0.77 mg/dL (ref 0.40–1.20)
Total Bilirubin: 0.6 mg/dL (ref 0.3–1.2)

## 2008-10-27 LAB — CBC WITH DIFFERENTIAL/PLATELET
BASO%: 1.1 % (ref 0.0–2.0)
EOS%: 0.5 % (ref 0.0–7.0)
HCT: 34.4 % — ABNORMAL LOW (ref 34.8–46.6)
MCH: 34.5 pg — ABNORMAL HIGH (ref 25.1–34.0)
MCHC: 33.4 g/dL (ref 31.5–36.0)
MONO%: 9.3 % (ref 0.0–14.0)
NEUT%: 62 % (ref 38.4–76.8)
lymph#: 1.7 10*3/uL (ref 0.9–3.3)

## 2008-11-03 ENCOUNTER — Ambulatory Visit: Payer: Self-pay | Admitting: Family Medicine

## 2008-11-16 ENCOUNTER — Ambulatory Visit: Payer: Self-pay | Admitting: Oncology

## 2008-11-18 LAB — CBC WITH DIFFERENTIAL/PLATELET
BASO%: 1.1 % (ref 0.0–2.0)
HCT: 36.1 % (ref 34.8–46.6)
LYMPH%: 24.6 % (ref 14.0–49.7)
MCHC: 33.8 g/dL (ref 31.5–36.0)
MCV: 103.1 fL — ABNORMAL HIGH (ref 79.5–101.0)
MONO#: 0.3 10*3/uL (ref 0.1–0.9)
NEUT%: 64.3 % (ref 38.4–76.8)
Platelets: 149 10*3/uL (ref 145–400)
WBC: 5.3 10*3/uL (ref 3.9–10.3)

## 2008-11-22 ENCOUNTER — Ambulatory Visit: Payer: Self-pay | Admitting: Psychiatry

## 2008-12-20 ENCOUNTER — Encounter: Payer: Self-pay | Admitting: Internal Medicine

## 2009-01-03 ENCOUNTER — Ambulatory Visit: Payer: Self-pay | Admitting: Oncology

## 2009-01-16 ENCOUNTER — Ambulatory Visit: Payer: Self-pay

## 2009-01-16 ENCOUNTER — Ambulatory Visit (HOSPITAL_COMMUNITY): Admission: RE | Admit: 2009-01-16 | Discharge: 2009-01-16 | Payer: Self-pay | Admitting: Oncology

## 2009-01-16 ENCOUNTER — Ambulatory Visit: Payer: Self-pay | Admitting: Cardiology

## 2009-01-16 ENCOUNTER — Encounter: Payer: Self-pay | Admitting: Oncology

## 2009-01-17 ENCOUNTER — Encounter: Payer: Self-pay | Admitting: Internal Medicine

## 2009-01-17 LAB — CBC WITH DIFFERENTIAL/PLATELET
Basophils Absolute: 0 10*3/uL (ref 0.0–0.1)
EOS%: 2.9 % (ref 0.0–7.0)
HCT: 40.7 % (ref 34.8–46.6)
HGB: 13.9 g/dL (ref 11.6–15.9)
LYMPH%: 25.8 % (ref 14.0–49.7)
MCH: 33.4 pg (ref 25.1–34.0)
MCV: 97.9 fL (ref 79.5–101.0)
MONO%: 7.4 % (ref 0.0–14.0)
NEUT%: 63.3 % (ref 38.4–76.8)
Platelets: 194 10*3/uL (ref 145–400)

## 2009-01-17 LAB — COMPREHENSIVE METABOLIC PANEL
AST: 16 U/L (ref 0–37)
Alkaline Phosphatase: 83 U/L (ref 39–117)
BUN: 13 mg/dL (ref 6–23)
Calcium: 9.3 mg/dL (ref 8.4–10.5)
Chloride: 102 mEq/L (ref 96–112)
Creatinine, Ser: 1.03 mg/dL (ref 0.40–1.20)

## 2009-02-14 ENCOUNTER — Ambulatory Visit: Payer: Self-pay | Admitting: Oncology

## 2009-03-29 ENCOUNTER — Ambulatory Visit: Payer: Self-pay | Admitting: Oncology

## 2009-03-31 ENCOUNTER — Encounter: Payer: Self-pay | Admitting: Family Medicine

## 2009-04-13 ENCOUNTER — Encounter: Payer: Self-pay | Admitting: Oncology

## 2009-04-13 ENCOUNTER — Ambulatory Visit: Payer: Self-pay | Admitting: Cardiology

## 2009-04-13 ENCOUNTER — Ambulatory Visit: Admission: RE | Admit: 2009-04-13 | Discharge: 2009-04-13 | Payer: Self-pay | Admitting: Oncology

## 2009-04-21 LAB — CBC WITH DIFFERENTIAL/PLATELET
BASO%: 0.9 % (ref 0.0–2.0)
EOS%: 3.1 % (ref 0.0–7.0)
HCT: 35.2 % (ref 34.8–46.6)
LYMPH%: 32.6 % (ref 14.0–49.7)
MCH: 32.5 pg (ref 25.1–34.0)
MCHC: 33.8 g/dL (ref 31.5–36.0)
NEUT%: 55.1 % (ref 38.4–76.8)
Platelets: 129 10*3/uL — ABNORMAL LOW (ref 145–400)
RBC: 3.66 10*6/uL — ABNORMAL LOW (ref 3.70–5.45)

## 2009-04-21 LAB — COMPREHENSIVE METABOLIC PANEL
BUN: 14 mg/dL (ref 6–23)
CO2: 26 mEq/L (ref 19–32)
Creatinine, Ser: 0.77 mg/dL (ref 0.40–1.20)
Glucose, Bld: 95 mg/dL (ref 70–99)
Total Bilirubin: 0.3 mg/dL (ref 0.3–1.2)

## 2009-05-01 ENCOUNTER — Encounter: Admission: RE | Admit: 2009-05-01 | Discharge: 2009-05-01 | Payer: Self-pay | Admitting: Radiation Oncology

## 2009-05-02 LAB — ESTRADIOL, ULTRA SENS: Estradiol, Ultra Sensitive: 164 pg/mL

## 2009-05-03 ENCOUNTER — Encounter: Payer: Self-pay | Admitting: Family Medicine

## 2009-05-12 ENCOUNTER — Ambulatory Visit: Payer: Self-pay | Admitting: Oncology

## 2009-05-12 LAB — CBC WITH DIFFERENTIAL/PLATELET
Basophils Absolute: 0 10*3/uL (ref 0.0–0.1)
EOS%: 3.2 % (ref 0.0–7.0)
Eosinophils Absolute: 0.2 10*3/uL (ref 0.0–0.5)
HCT: 34.9 % (ref 34.8–46.6)
HGB: 11.8 g/dL (ref 11.6–15.9)
LYMPH%: 31.1 % (ref 14.0–49.7)
MCH: 33.6 pg (ref 25.1–34.0)
MCV: 99.3 fL (ref 79.5–101.0)
MONO%: 8.4 % (ref 0.0–14.0)
NEUT#: 2.8 10*3/uL (ref 1.5–6.5)
NEUT%: 56.5 % (ref 38.4–76.8)
Platelets: 185 10*3/uL (ref 145–400)
RDW: 12.5 % (ref 11.2–14.5)

## 2009-05-29 ENCOUNTER — Encounter (INDEPENDENT_AMBULATORY_CARE_PROVIDER_SITE_OTHER): Payer: Self-pay | Admitting: *Deleted

## 2009-05-30 ENCOUNTER — Encounter: Payer: Self-pay | Admitting: Internal Medicine

## 2009-05-30 LAB — CBC WITH DIFFERENTIAL/PLATELET
BASO%: 0.5 % (ref 0.0–2.0)
MCHC: 34 g/dL (ref 31.5–36.0)
MONO#: 0.4 10*3/uL (ref 0.1–0.9)
NEUT#: 2.9 10*3/uL (ref 1.5–6.5)
NEUT%: 57.1 % (ref 38.4–76.8)
Platelets: 145 10*3/uL (ref 145–400)
lymph#: 1.6 10*3/uL (ref 0.9–3.3)

## 2009-05-30 LAB — COMPREHENSIVE METABOLIC PANEL
ALT: 19 U/L (ref 0–35)
AST: 22 U/L (ref 0–37)
Calcium: 9.1 mg/dL (ref 8.4–10.5)
Chloride: 101 mEq/L (ref 96–112)
Creatinine, Ser: 0.82 mg/dL (ref 0.40–1.20)
Sodium: 137 mEq/L (ref 135–145)
Total Protein: 6.6 g/dL (ref 6.0–8.3)

## 2009-06-21 ENCOUNTER — Ambulatory Visit: Payer: Self-pay | Admitting: Oncology

## 2009-06-23 ENCOUNTER — Encounter (INDEPENDENT_AMBULATORY_CARE_PROVIDER_SITE_OTHER): Payer: Self-pay

## 2009-06-27 ENCOUNTER — Ambulatory Visit: Payer: Self-pay | Admitting: Internal Medicine

## 2009-07-21 ENCOUNTER — Ambulatory Visit: Payer: Self-pay | Admitting: Oncology

## 2009-07-21 ENCOUNTER — Encounter: Payer: Self-pay | Admitting: Internal Medicine

## 2009-07-21 LAB — CBC WITH DIFFERENTIAL/PLATELET
BASO%: 0.8 % (ref 0.0–2.0)
Basophils Absolute: 0.1 10*3/uL (ref 0.0–0.1)
EOS%: 2.3 % (ref 0.0–7.0)
HCT: 36.3 % (ref 34.8–46.6)
HGB: 12.6 g/dL (ref 11.6–15.9)
MCH: 33.2 pg (ref 25.1–34.0)
MCHC: 34.7 g/dL (ref 31.5–36.0)
MCV: 95.5 fL (ref 79.5–101.0)
MONO%: 6.6 % (ref 0.0–14.0)
NEUT%: 64.1 % (ref 38.4–76.8)
lymph#: 1.6 10*3/uL (ref 0.9–3.3)

## 2009-07-21 LAB — COMPREHENSIVE METABOLIC PANEL
AST: 19 U/L (ref 0–37)
Albumin: 3.9 g/dL (ref 3.5–5.2)
Alkaline Phosphatase: 62 U/L (ref 39–117)
BUN: 15 mg/dL (ref 6–23)
Creatinine, Ser: 0.82 mg/dL (ref 0.40–1.20)
Glucose, Bld: 95 mg/dL (ref 70–99)
Potassium: 4.3 mEq/L (ref 3.5–5.3)
Total Bilirubin: 0.6 mg/dL (ref 0.3–1.2)

## 2009-07-21 LAB — CANCER ANTIGEN 27.29: CA 27.29: 17 U/mL (ref 0–39)

## 2009-08-07 HISTORY — PX: COLONOSCOPY: SHX174

## 2009-08-08 ENCOUNTER — Encounter: Payer: Self-pay | Admitting: Oncology

## 2009-08-08 ENCOUNTER — Ambulatory Visit: Admission: RE | Admit: 2009-08-08 | Discharge: 2009-08-08 | Payer: Self-pay | Admitting: Oncology

## 2009-08-16 LAB — CANCER ANTIGEN 27.29: CA 27.29: 24 U/mL (ref 0–39)

## 2009-08-16 LAB — COMPREHENSIVE METABOLIC PANEL
ALT: 15 U/L (ref 0–35)
Albumin: 4.3 g/dL (ref 3.5–5.2)
Alkaline Phosphatase: 65 U/L (ref 39–117)
CO2: 26 mEq/L (ref 19–32)
Glucose, Bld: 70 mg/dL (ref 70–99)
Potassium: 4.4 mEq/L (ref 3.5–5.3)
Sodium: 140 mEq/L (ref 135–145)
Total Protein: 6.6 g/dL (ref 6.0–8.3)

## 2009-08-16 LAB — CBC WITH DIFFERENTIAL/PLATELET
BASO%: 0.8 % (ref 0.0–2.0)
Eosinophils Absolute: 0.2 10*3/uL (ref 0.0–0.5)
MCHC: 34.5 g/dL (ref 31.5–36.0)
MONO#: 0.3 10*3/uL (ref 0.1–0.9)
NEUT#: 2.3 10*3/uL (ref 1.5–6.5)
RBC: 3.67 10*6/uL — ABNORMAL LOW (ref 3.70–5.45)
RDW: 12.2 % (ref 11.2–14.5)
WBC: 4.4 10*3/uL (ref 3.9–10.3)

## 2009-08-24 ENCOUNTER — Ambulatory Visit: Payer: Self-pay | Admitting: Oncology

## 2009-08-28 ENCOUNTER — Encounter: Payer: Self-pay | Admitting: Family Medicine

## 2009-09-07 ENCOUNTER — Ambulatory Visit: Payer: Self-pay | Admitting: Internal Medicine

## 2009-09-07 LAB — HM COLONOSCOPY

## 2009-10-06 ENCOUNTER — Ambulatory Visit (HOSPITAL_BASED_OUTPATIENT_CLINIC_OR_DEPARTMENT_OTHER): Admission: RE | Admit: 2009-10-06 | Discharge: 2009-10-06 | Payer: Self-pay | Admitting: General Surgery

## 2009-10-10 ENCOUNTER — Encounter: Payer: Self-pay | Admitting: Family Medicine

## 2009-11-14 ENCOUNTER — Ambulatory Visit: Payer: Self-pay | Admitting: Family Medicine

## 2009-11-17 ENCOUNTER — Ambulatory Visit: Payer: Self-pay | Admitting: Oncology

## 2009-11-21 LAB — COMPREHENSIVE METABOLIC PANEL
AST: 18 U/L (ref 0–37)
Albumin: 4.2 g/dL (ref 3.5–5.2)
BUN: 12 mg/dL (ref 6–23)
Calcium: 9 mg/dL (ref 8.4–10.5)
Chloride: 104 mEq/L (ref 96–112)
Potassium: 4.2 mEq/L (ref 3.5–5.3)

## 2009-11-21 LAB — CBC WITH DIFFERENTIAL/PLATELET
Basophils Absolute: 0 10*3/uL (ref 0.0–0.1)
EOS%: 2.4 % (ref 0.0–7.0)
Eosinophils Absolute: 0.1 10*3/uL (ref 0.0–0.5)
HGB: 12.5 g/dL (ref 11.6–15.9)
MCH: 34 pg (ref 25.1–34.0)
NEUT#: 2.5 10*3/uL (ref 1.5–6.5)
RDW: 12.2 % (ref 11.2–14.5)
WBC: 4.7 10*3/uL (ref 3.9–10.3)
lymph#: 1.6 10*3/uL (ref 0.9–3.3)

## 2009-11-28 ENCOUNTER — Encounter: Payer: Self-pay | Admitting: Family Medicine

## 2010-02-04 LAB — CONVERTED CEMR LAB
ALT: 20 units/L (ref 0–35)
Albumin: 4.1 g/dL (ref 3.5–5.2)
Blood in Urine, dipstick: NEGATIVE
CO2: 28 meq/L (ref 19–32)
Calcium: 9.2 mg/dL (ref 8.4–10.5)
Cholesterol: 145 mg/dL (ref 0–200)
Creatinine, Ser: 0.9 mg/dL (ref 0.4–1.2)
Eosinophils Absolute: 0.2 10*3/uL (ref 0.0–0.7)
Eosinophils Relative: 3.2 % (ref 0.0–5.0)
Folate: >20 ng/mL
Glucose, Urine, Semiquant: NEGATIVE
HCT: 40.3 % (ref 36.0–46.0)
HDL: 58.8 mg/dL (ref >39.0)
MCV: 97.8 fL (ref 78.0–100.0)
Monocytes Absolute: 0.3 10*3/uL (ref 0.1–1.0)
Platelets: 134 10*3/uL — ABNORMAL LOW (ref 150–400)
RDW: 12.3 % (ref 11.5–14.6)
Rhuematoid fact SerPl-aCnc: <20 intl units/mL — ABNORMAL LOW (ref 0.0–20.0)
Sed Rate: 7 mm/hr (ref 0–22)
Specific Gravity, Urine: 1.015
Total Bilirubin: 0.9 mg/dL (ref 0.3–1.2)
Total Protein: 6.9 g/dL (ref 6.0–8.3)
Triglycerides: 56 mg/dL (ref 0–149)
Vit D, 1,25-Dihydroxy: 30 (ref 30–89)
WBC: 5.1 10*3/uL (ref 4.5–10.5)
pH: 5

## 2010-02-08 NOTE — Letter (Signed)
Summary: [facility]  [facility]   Imported By: Lanelle Bal 12/11/2009 13:01:45  _____________________________________________________________________  External Attachment:    Type:   Image     Comment:   External Document

## 2010-02-08 NOTE — Letter (Signed)
[facility]   Imported By: Lanelle Bal 05/19/2009 11:02:49  _____________________________________________________________________  External Attachment:    Type:   Image     Comment:   External Document

## 2010-02-08 NOTE — Letter (Signed)
Summary: [facility] Instructions  [facility]  [address] [place], [place] [address]   Phone: [phone]  Fax: [phone]       RHILEY TARVER    June 27, 1960    MRN: [id number]       Procedure Day Dorna Bloom:  Jake Shark  07/18/09     Arrival Time: 9:00AM     Procedure Time:  10:00AM     Location of Procedure:                    Juliann Pares  [facility] (4th Floor)       PREPARATION FOR COLONOSCOPY WITH MIRALAX  Starting 5 days prior to your procedure 07/13/09 do not eat nuts, seeds, popcorn, corn, beans, peas,  salads, or any raw vegetables.  Do not take any fiber supplements (e.g. Metamucil, Citrucel, and Benefiber). ____________________________________________________________________________________________________   THE DAY BEFORE YOUR PROCEDURE         DATE: 07/17/09  DAY: MONDAY  1   Drink clear liquids the entire day-NO SOLID FOOD  2   Do not drink anything colored red or purple.  Avoid juices with pulp.  No orange juice.  3   Drink at least 64 oz. (8 glasses) of fluid/clear liquids during the day to prevent dehydration and help the prep work efficiently.  CLEAR LIQUIDS INCLUDE: Water Jello Ice Popsicles Tea (sugar ok, no milk/cream) Powdered fruit flavored drinks Coffee (sugar ok, no milk/cream) Gatorade Juice: apple, white grape, white cranberry  Lemonade Clear bullion, consomm, broth Carbonated beverages (any kind) Strained chicken noodle soup Hard Candy  4   Mix the entire bottle of Miralax with 64 oz. of Gatorade/Powerade in the morning and put in the refrigerator to chill.  5   At 3:00 pm take 2 Dulcolax/Bisacodyl tablets.  6   At 4:30 pm take one Reglan/Metoclopramide tablet.  7  Starting at 5:00 pm drink one 8 oz glass of the Miralax mixture every 15-20 minutes until you have finished drinking the entire 64 oz.  You should finish drinking prep around 7:30 or 8:00 pm.  8   If you are nauseated, you may take the 2nd Reglan/Metoclopramide  tablet at 6:30 pm.        9    At 8:00 pm take 2 more DULCOLAX/Bisacodyl tablets.     THE DAY OF YOUR PROCEDURE      DATE:  07/18/09   DAY: Jake Shark  You may drink clear liquids until 8:00AM (2 HOURS BEFORE PROCEDURE).   MEDICATION INSTRUCTIONS  Unless otherwise instructed, you should take regular prescription medications with a small sip of water as early as possible the morning of your procedure.         OTHER INSTRUCTIONS  You will need a responsible adult at least 49 years of age to accompany you and drive you home.   This person must remain in the waiting room during your procedure.  Wear loose fitting clothing that is easily removed.  Leave jewelry and other valuables at home.  However, you may wish to bring a book to read or an iPod/MP3 player to listen to music as you wait for your procedure to start.  Remove all body piercing jewelry and leave at home.  Total time from sign-in until discharge is approximately 2-3 hours.  You should go home directly after your procedure and rest.  You can resume normal activities the day after your procedure.  The day of your procedure you should not:  Drive   Make legal decisions   Operate machinery   Drink alcohol   Return to work  You will receive specific instructions about eating, activities and medications before you leave.   The above instructions have been reviewed and explained to me by   Ulis Rias RN  June 27, 2009 8:16 AM     I fully understand and can verbalize these instructions _____________________________ Date _______

## 2010-02-08 NOTE — Letter (Signed)
Summary: Treatment Summary/[facility]  Treatment Summary/[facility]   Imported By: Sherian Rein 01/12/2009 15:15:45  _____________________________________________________________________  External Attachment:    Type:   Image     Comment:   External Document

## 2010-02-08 NOTE — Procedures (Signed)
Summary: Colonoscopy  Patient: [name] Note: All result statuses are Final unless otherwise noted.  Tests: (1) Colonoscopy (COL)   COL Colonoscopy           DONE     [facility]     [address]     [place], [place]  [address]           COLONOSCOPY PROCEDURE REPORT           PATIENT:  Jeanette Boone, Jeanette Boone  MR#:  [id number]     BIRTHDATE:  Feb 09, 1960, 49 yrs. old  GENDER:  female     ENDOSCOPIST:  Hedwig Morton. Juanda Chance, MD     REF. BY:  Genia Del, M.D.     PROCEDURE DATE:  09/07/2009     PROCEDURE:  Colonoscopy 19147     ASA CLASS:  Class I     INDICATIONS:  Routine Risk Screening IBS     s/p chemo Rx for breast ca 2010     MEDICATIONS:   Versed mg, Fentanyl 75 mcg           DESCRIPTION OF PROCEDURE:   After the risks benefits and     alternatives of the procedure were thoroughly explained, informed     consent was obtained.  Digital rectal exam was performed and     revealed no rectal masses.   The  endoscope was introduced through     the anus and advanced to the cecum, which was identified by both     the appendix and ileocecal valve, without limitations.  The     quality of the prep was good, using MiraLax.  The instrument was     then slowly withdrawn as the colon was fully examined.     <<PROCEDUREIMAGES>>           FINDINGS:  No polyps or cancers were seen (see image1, image2,     image3, and image4).   Retroflexed views in the rectum revealed no     abnormalities.    The scope was then withdrawn from the patient     and the procedure completed.           COMPLICATIONS:  None     ENDOSCOPIC IMPRESSION:     1) No polyps or cancers     2) Normal colonoscopy     RECOMMENDATIONS:     1) high fiber diet     REPEAT EXAM:  In 10 year(s) for.           ______________________________     Hedwig Morton. Juanda Chance, MD           CC:  Ruthann Cancer, MD           n.     Rosalie DoctorHedwig Morton. Brodie at 09/07/2009 10:21 AM           Yaiza, [place], [phone]  Note: An  exclamation mark (!) indicates a result that was not dispersed into the flowsheet. Document Creation Date: 09/07/2009 10:22 AM _______________________________________________________________________  (1) Order result status: Final Collection or observation date-time: 09/07/2009 10:16 Requested date-time:  Receipt date-time:  Reported date-time:  Referring Physician:   Ordering Physician: Lina Sar [phone]) Specimen Source:  Source: Launa Grill Order Number: [id number] Lab site:   Appended Document: Colonoscopy    Clinical Lists Changes  Observations: Added new observation of COLONNXTDUE: 09/2019 (09/07/2009 10:47)

## 2010-02-08 NOTE — Consult Note (Signed)
Summary: [facility]  [facility]   Imported By: Lanelle Bal 10/18/2009 11:45:55  _____________________________________________________________________  External Attachment:    Type:   Image     Comment:   External Document

## 2010-02-08 NOTE — Letter (Signed)
Summary: Previsit letter  [facility]  [address] [place], [place] [address]   Phone: [phone]  Fax: [phone]       05/29/2009 MRN: [id number]  [facility] [address] [place], [place]  [address]  Dear Ms. Zech,  Welcome to the Gastroenterology Division at [facility].    You are scheduled to see a nurse for your pre-procedure visit on 06-27-09 at 8:00a.m. on the 3rd floor at [facility], [address]  We ask that you try to arrive at our office 15 minutes prior to your appointment time to allow for check-in.  Your nurse visit will consist of discussing your medical and surgical history, your immediate family medical history, and your medications.    Please bring a complete list of all your medications or, if you prefer, bring the medication bottles and we will list them.  We will need to be aware of both prescribed and over the counter drugs.  We will need to know exact dosage information as well.  If you are on blood thinners (Coumadin, Plavix, Aggrenox, Ticlid, etc.) please call our office today/prior to your appointment, as we need to consult with your physician about holding your medication.   Please be prepared to read and sign documents such as consent forms, a financial agreement, and acknowledgement forms.  If necessary, and with your consent, a friend or relative is welcome to sit-in on the nurse visit with you.  Please bring your insurance card so that we may make a copy of it.  If your insurance requires a referral to see a specialist, please bring your referral form from your primary care physician.  No co-pay is required for this nurse visit.     If you cannot keep your appointment, please call [phone] to cancel or reschedule prior to your appointment date.  This allows [place] the opportunity to schedule an appointment for another patient in need of care.    Thank you for choosing [facility] for your medical needs.  We  appreciate the opportunity to care for you.  Please visit [place] at our website  to learn more about our practice.                     Sincerely.                                                                                                                   The Gastroenterology Division

## 2010-02-08 NOTE — Letter (Signed)
[facility]   Imported By: Lennie Odor 07/31/2009 15:20:26  _____________________________________________________________________  External Attachment:    Type:   Image     Comment:   External Document

## 2010-02-08 NOTE — Letter (Signed)
[facility]   Imported By: Lester Curtice 02/04/2009 08:43:33  _____________________________________________________________________  External Attachment:    Type:   Image     Comment:   External Document

## 2010-02-08 NOTE — Letter (Signed)
[facility]   Imported By: Lester  06/22/2009 10:48:42  _____________________________________________________________________  External Attachment:    Type:   Image     Comment:   External Document

## 2010-02-08 NOTE — Letter (Signed)
Summary: [facility]  [facility]   Imported By: Lanelle Bal 09/27/2009 12:43:57  _____________________________________________________________________  External Attachment:    Type:   Image     Comment:   External Document

## 2010-02-08 NOTE — Procedures (Signed)
Summary: Colonoscopy  Patient: [name] Note: All result statuses are Final unless otherwise noted.  Tests: (1) Colonoscopy (COL)   COL Colonoscopy           DONE (C)     [facility]     [address]     [place], [place]  [address]           COLONOSCOPY PROCEDURE REPORT           PATIENT:  Jeanette Boone, Jeanette Boone  MR#:  [id number]     BIRTHDATE:  01-19-1960, 49 yrs. old  GENDER:  female     ENDOSCOPIST:  Hedwig Morton. Juanda Chance, MD     REF. BY:  Genia Del, M.D.     PROCEDURE DATE:  09/07/2009     PROCEDURE:  Colonoscopy 295[address]     ASA CLASS:  Class I     INDICATIONS:  Routine Risk Screening IBS     s/p chemo Rx for breast ca 2010     MEDICATIONS:   Fentanyl 75 mcg, Versed 7 mg IV           DESCRIPTION OF PROCEDURE:   After the risks benefits and     alternatives of the procedure were thoroughly explained, informed     consent was obtained.  Digital rectal exam was performed and     revealed no rectal masses.   The  endoscope was introduced through     the anus and advanced to the cecum, which was identified by both     the appendix and ileocecal valve, without limitations.  The     quality of the prep was good, using MiraLax.  The instrument was     then slowly withdrawn as the colon was fully examined.     <<PROCEDUREIMAGES>>           FINDINGS:  No polyps or cancers were seen (see image1, image2,     image3, and image4).   Retroflexed views in the rectum revealed no     abnormalities.    The scope was then withdrawn from the patient     and the procedure completed.           COMPLICATIONS:  None     ENDOSCOPIC IMPRESSION:     1) No polyps or cancers     2) Normal colonoscopy     RECOMMENDATIONS:     1) high fiber diet     REPEAT EXAM:  In 10 year(s) for.           ______________________________     Hedwig Morton. Juanda Chance, MD           CC:  Ruthann Cancer, MD           n.     REVISED:  09/12/2009 01:02 PM     eSIGNED:   Hedwig Morton. Brodie at 09/12/2009 01:02 PM        Nathalya, [place], [phone]  Note: An exclamation mark (!) indicates a result that was not dispersed into the flowsheet. Document Creation Date: 09/12/2009 1:02 PM _______________________________________________________________________  (1) Order result status: Final Collection or observation date-time: 09/07/2009 10:16 Requested date-time:  Receipt date-time:  Reported date-time:  Referring Physician:   Ordering Physician: Lina Sar [phone]) Specimen Source:  Source: Launa Grill Order Number: [id number] Lab site:

## 2010-02-08 NOTE — Assessment & Plan Note (Signed)
Summary: FLU SHOT///SPH  Nurse Visit   Allergies: 1)  ! Penicillin 2)  ! Biaxin  Orders Added: 1)  Admin 1st Vaccine [90471] 2)  Flu Vaccine 35yrs + [81191] Flu Vaccine Consent Questions     Do you have a history of severe allergic reactions to this vaccine? no    Any prior history of allergic reactions to egg and/or gelatin? no    Do you have a sensitivity to the preservative Thimersol? no    Do you have a past history of Guillan-Barre Syndrome? no    Do you currently have an acute febrile illness? no    Have you ever had a severe reaction to latex? no    Vaccine information given and explained to patient? yes    Are you currently pregnant? no    Lot Number:[id number]   Exp Date:07/07/2010   Site Given  Left Deltoid IM .lbflu

## 2010-02-08 NOTE — Miscellaneous (Signed)
Summary: Lec previsit  Clinical Lists Changes  Medications: Added new medication of DULCOLAX 5 MG  TBEC (BISACODYL) Day before procedure take 2 at 3pm and 2 at 8pm. - Signed Added new medication of MIRALAX   POWD (POLYETHYLENE GLYCOL 3350) As per prep  instructions. - Signed Added new medication of REGLAN 10 MG  TABS (METOCLOPRAMIDE HCL) As per prep instructions. - Signed Rx of DULCOLAX 5 MG  TBEC (BISACODYL) Day before procedure take 2 at 3pm and 2 at 8pm.;  #4 x 0;  Signed;  Entered by: Ulis Rias RN;  Authorized by: Hart Carwin MD;  Method used: [facility] to [facility]  [phone]*, [address] [place], [place]  [address], Ph: [phone] or [phone], Fax: [phone] Rx of MIRALAX   POWD (POLYETHYLENE GLYCOL 3350) As per prep  instructions.;  #255gm x 0;  Signed;  Entered by: Ulis Rias RN;  Authorized by: Hart Carwin MD;  Method used: [facility]ically to [facility]  [phone]*, [address], [place], [place]  [address], Ph: [phone] or [phone], Fax: [phone] Rx of REGLAN 10 MG  TABS (METOCLOPRAMIDE HCL) As per prep instructions.;  #2 x 0;  Signed;  Entered by: Ulis Rias RN;  Authorized by: Hart Carwin MD;  Method used: Electronically to [facility]  [phone]*, [address], [place], [place]  [address], Ph: [phone] or [phone], Fax: [phone] Observations: Added new observation of ALLERGY REV: Done (06/27/2009 7:59)    Prescriptions: REGLAN 10 MG  TABS (METOCLOPRAMIDE HCL) As per prep instructions.  #2 x 0   Entered by:   Ulis Rias RN   Authorized by:   Hart Carwin MD   Signed by:   Ulis Rias RN on 06/27/2009   Method used:   Electronically to        [facility]  [phone]* (retail)       [address] [place], [place]  [address]       Ph: [phone] or [phone]       Fax: [phone]   RxID:   [id number] MIRALAX   POWD (POLYETHYLENE GLYCOL 3350) As per prep  instructions.  #255gm x 0   Entered  by:   Ulis Rias RN   Authorized by:   Hart Carwin MD   Signed by:   Ulis Rias RN on 06/27/2009   Method used:   Electronically to        [facility]  [phone]* (retail)       [address] [place], [place]  [address]       Ph: [phone] or [phone]       Fax: [phone]   RxID:   [id number] DULCOLAX 5 MG  TBEC (BISACODYL) Day before procedure take 2 at 3pm and 2 at 8pm.  #4 x 0   Entered by:   Ulis Rias RN   Authorized by:   Hart Carwin MD   Signed by:   Ulis Rias RN on 06/27/2009   Method used:   Electronically to        [facility]  [phone]* (retail)       [address] [place], [place]  [address]       Ph: [phone] or [phone]       Fax: [phone]   RxID:   [id number]

## 2010-02-08 NOTE — Medication Information (Signed)
Summary: Noncompliance with Cymbalta/[employer]  Noncompliance with Cymbalta/[employer]   Imported By: Lanelle Bal 04/03/2009 10:03:03  _____________________________________________________________________  External Attachment:    Type:   Image     Comment:   External Document

## 2010-02-20 ENCOUNTER — Other Ambulatory Visit: Payer: Self-pay | Admitting: Oncology

## 2010-02-20 ENCOUNTER — Encounter (HOSPITAL_BASED_OUTPATIENT_CLINIC_OR_DEPARTMENT_OTHER): Payer: Managed Care, Other (non HMO) | Admitting: Oncology

## 2010-02-20 DIAGNOSIS — D696 Thrombocytopenia, unspecified: Secondary | ICD-10-CM

## 2010-02-20 DIAGNOSIS — C50119 Malignant neoplasm of central portion of unspecified female breast: Secondary | ICD-10-CM

## 2010-02-20 LAB — COMPREHENSIVE METABOLIC PANEL
Albumin: 4.1 g/dL (ref 3.5–5.2)
Alkaline Phosphatase: 64 U/L (ref 39–117)
BUN: 12 mg/dL (ref 6–23)
CO2: 26 mEq/L (ref 19–32)
Calcium: 8.8 mg/dL (ref 8.4–10.5)
Chloride: 106 mEq/L (ref 96–112)
Glucose, Bld: 84 mg/dL (ref 70–99)
Potassium: 4.4 mEq/L (ref 3.5–5.3)

## 2010-02-20 LAB — CBC WITH DIFFERENTIAL/PLATELET
Basophils Absolute: 0 10*3/uL (ref 0.0–0.1)
Eosinophils Absolute: 0.1 10*3/uL (ref 0.0–0.5)
HGB: 13.3 g/dL (ref 11.6–15.9)
MCV: 98.5 fL (ref 79.5–101.0)
MONO%: 7.3 % (ref 0.0–14.0)
NEUT#: 3.3 10*3/uL (ref 1.5–6.5)
RDW: 12.1 % (ref 11.2–14.5)

## 2010-02-20 LAB — CANCER ANTIGEN 27.29: CA 27.29: 24 U/mL (ref 0–39)

## 2010-02-27 ENCOUNTER — Other Ambulatory Visit: Payer: Self-pay | Admitting: Oncology

## 2010-02-27 ENCOUNTER — Encounter: Payer: Self-pay | Admitting: Family Medicine

## 2010-02-27 ENCOUNTER — Encounter (HOSPITAL_BASED_OUTPATIENT_CLINIC_OR_DEPARTMENT_OTHER): Payer: Managed Care, Other (non HMO) | Admitting: Oncology

## 2010-02-27 DIAGNOSIS — C50919 Malignant neoplasm of unspecified site of unspecified female breast: Secondary | ICD-10-CM

## 2010-02-27 DIAGNOSIS — C50119 Malignant neoplasm of central portion of unspecified female breast: Secondary | ICD-10-CM

## 2010-03-27 NOTE — Letter (Signed)
Summary: [facility]  [facility]   Imported By: Maryln Gottron 03/21/2010 15:18:35  _____________________________________________________________________  External Attachment:    Type:   Image     Comment:   External Document

## 2010-04-10 ENCOUNTER — Other Ambulatory Visit: Payer: Self-pay | Admitting: Oncology

## 2010-04-10 DIAGNOSIS — Z9889 Other specified postprocedural states: Secondary | ICD-10-CM

## 2010-04-11 ENCOUNTER — Other Ambulatory Visit: Payer: Self-pay | Admitting: Oncology

## 2010-04-11 DIAGNOSIS — Z9889 Other specified postprocedural states: Secondary | ICD-10-CM

## 2010-04-11 DIAGNOSIS — R922 Inconclusive mammogram: Secondary | ICD-10-CM

## 2010-04-15 LAB — CBC
MCHC: 33.8 g/dL (ref 30.0–36.0)
Platelets: 202 10*3/uL (ref 150–400)
RDW: 12.5 % (ref 11.5–15.5)

## 2010-04-15 LAB — RAPID URINE DRUG SCREEN, HOSP PERFORMED
Barbiturates: NOT DETECTED
Benzodiazepines: POSITIVE — AB
Cocaine: NOT DETECTED

## 2010-04-15 LAB — DIFFERENTIAL
Basophils Absolute: 0.1 10*3/uL (ref 0.0–0.1)
Basophils Relative: 1 % (ref 0–1)
Lymphocytes Relative: 31 % (ref 12–46)
Neutro Abs: 4.5 10*3/uL (ref 1.7–7.7)

## 2010-04-15 LAB — BASIC METABOLIC PANEL
BUN: 11 mg/dL (ref 6–23)
CO2: 25 mEq/L (ref 19–32)
Calcium: 9.6 mg/dL (ref 8.4–10.5)
Creatinine, Ser: 0.76 mg/dL (ref 0.4–1.2)
GFR calc non Af Amer: >60 mL/min (ref >60)
Glucose, Bld: 119 mg/dL — ABNORMAL HIGH (ref 70–99)
Sodium: 137 mEq/L (ref 135–145)

## 2010-04-16 LAB — CBC
HCT: 38.6 % (ref 36.0–46.0)
MCV: 97.4 fL (ref 78.0–100.0)
Platelets: 178 10*3/uL (ref 150–400)
Platelets: 180 10*3/uL (ref 150–400)
RDW: 12 % (ref 11.5–15.5)
RDW: 12 % (ref 11.5–15.5)

## 2010-04-16 LAB — COMPREHENSIVE METABOLIC PANEL
ALT: 17 U/L (ref 0–35)
Albumin: 3.6 g/dL (ref 3.5–5.2)
Alkaline Phosphatase: 63 U/L (ref 39–117)
GFR calc Af Amer: >60 mL/min (ref >60)
Potassium: 3.8 mEq/L (ref 3.5–5.1)
Sodium: 137 mEq/L (ref 135–145)
Total Protein: 6.3 g/dL (ref 6.0–8.3)

## 2010-04-17 LAB — POCT HEMOGLOBIN-HEMACUE: Hemoglobin: 14.4 g/dL (ref 12.0–15.0)

## 2010-04-17 LAB — URINALYSIS, ROUTINE W REFLEX MICROSCOPIC
Bilirubin Urine: NEGATIVE
Glucose, UA: NEGATIVE mg/dL
Ketones, ur: NEGATIVE mg/dL
Protein, ur: NEGATIVE mg/dL

## 2010-04-17 LAB — DIFFERENTIAL
Lymphocytes Relative: 26 % (ref 12–46)
Lymphs Abs: 1.6 10*3/uL (ref 0.7–4.0)
Monocytes Relative: 7 % (ref 3–12)
Neutro Abs: 4.1 10*3/uL (ref 1.7–7.7)
Neutrophils Relative %: 66 % (ref 43–77)

## 2010-04-17 LAB — CBC
Hemoglobin: 12.3 g/dL (ref 12.0–15.0)
MCHC: 33.2 g/dL (ref 30.0–36.0)
MCV: 98.5 fL (ref 78.0–100.0)
Platelets: 138 10*3/uL — ABNORMAL LOW (ref 150–400)
RBC: 3.98 MIL/uL (ref 3.87–5.11)
RDW: 12.2 % (ref 11.5–15.5)
WBC: 6.2 10*3/uL (ref 4.0–10.5)

## 2010-04-17 LAB — PREGNANCY, URINE: Preg Test, Ur: NEGATIVE

## 2010-04-17 LAB — COMPREHENSIVE METABOLIC PANEL
Albumin: 4.2 g/dL (ref 3.5–5.2)
Alkaline Phosphatase: 63 U/L (ref 39–117)
BUN: 7 mg/dL (ref 6–23)
CO2: 30 mEq/L (ref 19–32)
Chloride: 105 mEq/L (ref 96–112)
Creatinine, Ser: 0.73 mg/dL (ref 0.4–1.2)
GFR calc non Af Amer: >60 mL/min (ref >60)
Glucose, Bld: 66 mg/dL — ABNORMAL LOW (ref 70–99)
Total Bilirubin: 0.8 mg/dL (ref 0.3–1.2)

## 2010-04-17 LAB — LACTATE DEHYDROGENASE: LDH: 149 U/L (ref 94–250)

## 2010-04-17 LAB — CANCER ANTIGEN 27.29: CA 27.29: 26 U/mL (ref 0–39)

## 2010-05-15 ENCOUNTER — Other Ambulatory Visit: Payer: Managed Care, Other (non HMO)

## 2010-05-21 ENCOUNTER — Other Ambulatory Visit: Payer: Self-pay | Admitting: Oncology

## 2010-05-21 DIAGNOSIS — Z9889 Other specified postprocedural states: Secondary | ICD-10-CM

## 2010-05-21 DIAGNOSIS — R922 Inconclusive mammogram: Secondary | ICD-10-CM

## 2010-05-22 ENCOUNTER — Ambulatory Visit
Admission: RE | Admit: 2010-05-22 | Discharge: 2010-05-22 | Disposition: A | Discharge status: Home / Self Care | Payer: Managed Care, Other (non HMO) | Source: Ambulatory Visit | Attending: Oncology | Admitting: Oncology

## 2010-05-22 ENCOUNTER — Other Ambulatory Visit: Payer: Managed Care, Other (non HMO)

## 2010-05-22 DIAGNOSIS — Z9889 Other specified postprocedural states: Secondary | ICD-10-CM

## 2010-05-22 NOTE — Op Note (Signed)
Jeanette Boone, Jeanette Boone                 ACCOUNT NO.:  [id number]   MEDICAL RECORD NO.:  [id number]          PATIENT TYPE:  AMB   LOCATION:  DAY                          FACILITY:  [facility]   PHYSICIAN:  Angelia Mould. Derrell Lolling, M.D.DATE OF BIRTH:  1960/03/10   DATE OF PROCEDURE:  06/01/2008  DATE OF DISCHARGE:                               OPERATIVE REPORT   PREOPERATIVE DIAGNOSES:  Chronic cholecystitis with cholelithiasis.   POSTOPERATIVE DIAGNOSES:  Chronic cholecystitis with cholelithiasis.   OPERATION PERFORMED:  Laparoscopic cholecystectomy with intraoperative  cholangiogram.   SURGEON:  Dr. Claud Kelp.   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  She is scheduled to see Dr. Marikay Alar  Magrinat regarding adjuvant therapy.  She is healing up from that  surgery.  She has a history of umbilical hernia repair with mesh.  She  has a history of endometriosis.  She has been having biliary colic and a  gallbladder ultrasound shows gallstones.  This is intermittent.  She  very much wants to have her gallbladder removed before she begins any  adjuvant therapy for her breast cancer.  She is brought to the operating  room electively.   OPERATIVE TECHNIQUE:  Following the induction of general endotracheal  anesthesia, the patient's abdomen was prepped and draped in a sterile  fashion.  Intravenous antibiotics were given.  The patient was  identified as correct patient and correct procedure.   I attempted to put a 5-mm optical port in the left subcostal area to  stay away from the mesh repair of the umbilical hernia, but her  abdominal wall was so elastic that I was unable to safely traverse the  muscle layers and so I chose to abandon that approach rather than  producing injury.  I then made a  vertically oriented incision above the  umbilicus.  I identified the midline and made an incision in the fascia  in the midline.  This was just above the edge of the mesh.  I entered  the abdominal cavity under direct vision and fortunately there were no  adhesions.  An 11 mm port was inserted and secured with a pursestring  suture of 0 Vicryl.  Pneumoperitoneum was created.  Video camera was  inserted.   A 11 mm trocar was placed in the subxiphoid region and two 5 mm trocars  placed in the right midabdomen.  The gallbladder looked slightly  distended but was soft and thin walled.  There were lots of adhesions to  the lower body of the gallbladder.  The anatomy of the cystic duct and  cystic artery and common bile duct were normal.  This cholangiogram was  normal showing normal intrahepatic and extrahepatic bile ducts, no  filling defect, and good flow of contrast into the duodenum.  The liver,  stomach, duodenum, small intestine, large intestine and peritoneal  surfaces were otherwise normal.   We elevated the  gallbladder fundus.  We took the adhesions down and  after dissecting the infundibulum completely away we retracted the  infundibulum laterally.  We dissect the peritoneum off of the cystic  duct and the cystic artery.  The cystic artery was isolated as it went  onto the wall of the gallbladder, secured with multiple metal clips and  divided.  We then created a large window and had a nice critical view of  the cystic duct.  A cholangiogram catheter was inserted into the cystic  duct.  A cholangiogram was obtained using the C-arm.  The cholangiogram  was normal as described above.  The cholangiogram catheter was removed,  the cystic duct was secured with multiple metal clips and divided.  The  gallbladder was dissected from its bed with electrocautery, placed in a  specimen bag and removed.  We made one tiny hole in the gallbladder and  spilled a little bit of bile, but not much.   We irrigated copiously.  At  the completion of the case, there was no bleeding and no bile leak  whatsoever.  The irrigation fluid was completely clear.  The trocars  were removed under direct vision.  There was no bleeding from trocar  sites.  The pneumoperitoneum was released.  The fascia at the umbilicus  was closed with 0 Vicryl sutures.  The skin incisions were closed with  subcuticular sutures of 4-0 Monocryl and Steri-Strips.  Clean bandages  were placed and the patient taken to the recovery room in stable  condition.  Estimated blood loss was about 10 mL.  Complications none.  Sponge, needle and instrument counts were correct.      Angelia Mould. Derrell Lolling, M.D.  Electronically Signed     HMI/MEDQ  D:  06/01/2008  T:  06/01/2008  Job:  [id number]   cc:   Genia Del, M.D.  Fax: [phone]   Evie Lacks, MD  Fax: [phone]   Valentino Hue. Magrinat, M.D.  Fax: [phone]

## 2010-05-22 NOTE — Op Note (Signed)
Jeanette Boone, Jeanette Boone                 ACCOUNT NO.:  [id number]   MEDICAL RECORD NO.:  [id number]          PATIENT TYPE:  AMB   LOCATION:  SDC                           FACILITY:  [facility]   PHYSICIAN:  Genia Del, M.D.DATE OF BIRTH:  12/09/1960   DATE OF PROCEDURE:  06/30/2008  DATE OF DISCHARGE:                               OPERATIVE REPORT   PREOPERATIVE DIAGNOSIS:  Intrauterine polyp.   POSTOPERATIVE DIAGNOSIS:  Intrauterine polyp.   PROCEDURES:  Hysteroscopy, resection, dilatation and curettage.   SURGEON:  Genia Del, MD   ANESTHESIOLOGIST:  Angelica Pou, MD   PROCEDURE IN DETAIL:  Under MAC analgesia, the patient was in lithotomy  position.  She was prepped with Betadine on the suprapubic, vulvar and  vaginal areas and draped as usual.  The bladder was catheterized.  The  patient was draped as usual.  The vaginal exam revealed a retroverted  uterus, normal volume, mobile, no adnexal mass.  The speculum was  introduced in the vagina.  The anterior lip of the cervix was grasped  with a tenaculum.  A paracervical block was done with Nesacaine 1%, a  total of 20 mL at 4 and 8 o'clock.  We then proceeded with hysterometry,  which is at 8 cm.  We dilated the cervix with Hegar dilators up to #33  without difficulty.  We then introduced the operative hysteroscope in  the intrauterine cavity, visualized the intrauterine cavity and  endometrial polyp was present at the left fundal area.  The rest of the  cavity appeared normal.  The ostia were visualized.  Pictures were  taken.  We then used the loop to resect the endometrial polyp.  It was  resected and removed from the intrauterine cavity.  We then removed the  hysteroscope.  We proceeded with a systematic curettage of the  endometrial cavity.  All surfaces were curetted with a sharp curette.  The specimen was sent with the polyp specimen to Pathology.  We then  removed all instruments.  Hemostasis was adequate.  The  estimated blood  loss was minimal.  The fluid deficit was 200 mL.  No complications  occurred and the patient was brought to recovery room in good stable  status.  Note that a dose of Ancef 1 g IV was given before induction.      Genia Del, M.D.  Electronically Signed     ML/MEDQ  D:  06/30/2008  T:  06/30/2008  Job:  [id number]

## 2010-05-22 NOTE — Op Note (Signed)
Jeanette Boone, Jeanette Boone                 ACCOUNT NO.:  [id number]   MEDICAL RECORD NO.:  [id number]          PATIENT TYPE:  AMB   LOCATION:  DSC                          FACILITY:  [facility]   PHYSICIAN:  Angelia Mould. Derrell Lolling, M.D.DATE OF BIRTH:  [birth year]   DATE OF PROCEDURE:  05/16/2008  DATE OF DISCHARGE:  05/16/2008                               OPERATIVE REPORT   PREOPERATIVE DIAGNOSIS:  Invasive ductal carcinoma, right breast.   POSTOPERATIVE DIAGNOSIS:  Invasive ductal carcinoma, right breast,  clinical stage T1c, N0.   OPERATIONS PERFORMED:  1. Inject blue dye, right breast.  2. Right partial mastectomy with needle localization.  3. Right axillary sentinel lymph node biopsy.   SURGEON:  Angelia Mould. Derrell Lolling, MD   OPERATIVE INDICATIONS:  This is a 50 year old Caucasian female who has  intentionally lost weight recently and 1 month ago felt a small palpable  mass in the right breast above the nipple in the upper central breast.  By ultrasound, this was 1.2 cm and at the 1 o'clock position.  By MRI,  it was 1.8 cm.  An image-guided core biopsy showed invasive ductal  carcinoma.  This is ER and PR negative and HER2 is borderline amplified.  MRI shows no other abnormalities and no adenopathy.  We talked about  genetic counseling in which she is interested in and she is going to be  scheduled for that.  On exam, the breast was little bit ptotic and quite  lumpy, but there may be a palpable abnormality at about 12 o'clock  position of the right breast.  She wanted to go ahead with definitive  surgery as soon as possible and wanted breast conservation surgery.  She  underwent wire localization of her right breast mass this morning by Dr.  Deboraha Sprang.  Unfortunately, the wire bent and the needle could not be removed  from the wire, so we had to do the surgery with both the needle and the  wire in place.  I do not think that created any major problem, however.   OPERATIVE TECHNIQUE:  Following wire  localization by Dr. Deboraha Sprang at the  breast center of [facility], the patient came to the [facility].  The nuclear medicine technician injected the radionuclide into  the right breast.  The patient was taken to the operating room.  General  anesthesia was induced.  A surgical time-out was held identifying  correct patient, correct procedure, and correct site.  Following an  alcohol prep, I injected 5 mL of blue dye in the right subareolar area  and massaged the breast for 5 minutes.  This was 2 mL of methylene blue  mixed with 3 mL of saline.  The right chest wall, shoulder, and axilla  were all prepped and draped in a sterile fashion.   I used the NeoProbe to localize the increased radioactivity in the right  axilla and a transverse skin crease incision was made.  Dissection was  carried down through the subcutaneous tissue and then through the  clavipectoral fascia.  I found 3 sentinel lymph nodes, one was  large and  2 were small, but all were very blue and all had lots of radioactivity.  These were all sent to the lab.  Dr. Colonel Bald performed frozen section  analysis and said that all 3 nodes were negative for tumor cells by  frozen section analysis.  The right axillary wound was irrigated with  saline.  Hemostasis was excellent.  The clavipectoral fascia was closed  with interrupted sutures of 3-0 Vicryl and skin closed with a running  subcuticular suture of 4-0 Monocryl and Steri-Strips.   The lumpectomy was performed next.  We observed the wire coming from the  very superior portion of the breast at about 12 o'clock position and  directed straight toward the nipple.  The breast was thinned out in this  area.  I made a curved elliptical incision to take skin over the tumor.  This incision was probably 3-1/2 inches long.  After making the skin  incision, I used the cautery and went down into the breast all the way  down to the chest wall.  The breast was quite firm and lumpy  and  glandular.  I did not feel the tumor at any time.  The specimen was  removed and marked with the 6 color margin marker kit.  I did biplane  images on the fracture try and looked like I had the marker in place on  both images.  Dr. Deboraha Sprang said that the clip was fairly close to 1 edge,  but probably that was near the chest wall and we could not get any more  without taking muscle.  I agreed.  I felt that clinically we had done a  good lumpectomy and that the best thing to do would be wait for the  margin analysis.  The breast wound was irrigated with saline.  Hemostasis was excellent and achieved with electrocautery.  The deeper  breast tissue down to the chest wall was closed with interrupted sutures  of 3-0 Vicryl.  Superficial breast tissue was closed with some  interrupted 3-0 Vicryl and skin closed with a running subcuticular  suture of 4-0 Monocryl and Steri-Strips.  Clean bandages were placed and  the patient was taken to recovery room in stable condition.  Estimated  blood loss was about 20 mL.  Complications none.  Spongiosa and  instrument were correct.      Angelia Mould. Derrell Lolling, M.D.  Electronically Signed     HMI/MEDQ  D:  05/16/2008  T:  05/17/2008  Job:  [id number]   cc:   Norva Pavlov, M.D.  Genia Del, M.D.  Lelon Perla, DO

## 2010-05-22 NOTE — Op Note (Signed)
NAMECRISTABEL, BICKNELL                 ACCOUNT NO.:  [id number]   MEDICAL RECORD NO.:  [id number]          PATIENT TYPE:  AMB   LOCATION:  SDS                          FACILITY:  [facility]   PHYSICIAN:  Angelia Mould. Derrell Lolling, M.D.DATE OF BIRTH:  03-Sep-1960   DATE OF PROCEDURE:  06/16/2008  DATE OF DISCHARGE:  06/16/2008                               OPERATIVE REPORT   PREOPERATIVE DIAGNOSIS:  Cancer of the right breast.   POSTOPERATIVE DIAGNOSIS:  Cancer of the right breast.   OPERATION PERFORMED:  Insertion of X-port venous vascular access device  with fluoroscopic guidance.   SURGEON:  Claud Kelp, M.D.   OPERATIVE INDICATIONS:  This is a 50 year old Caucasian female recently  diagnosed with invasive cancer of the right breast.  She has undergone  right partial mastectomy and right sentinel node biopsy on May 16, 2008.  Her nodes were negative.  The tumor was 1.5 cm with negative margins.  This is a T1c,N0, receptor negative, borderline HER-2 tumor.  She has  seen Dr. Marikay Alar Magrinat.  He plans to give her chemotherapy for 12 months.  He has requested that we insert a port.  I have counseled the patient  regarding the indications and details and risks and complications of  port insertion.  She is in full agreement of this plan and all of her  questions were answered.  She is brought to the operating room  electively.   OPERATIVE TECHNIQUE:  The patient was placed supine on the operating  table with her arms tucked at her sides and a small roll between her  shoulders.  She was monitored and sedated by the Anesthesia Department.  Intravenous antibiotics were given.  A surgical time-out and checklist  was performed.  Xylocaine 1% with epinephrine was used as a local  infiltration anesthetic.  A left subclavian venipuncture was performed  and a guidewire inserted into the superior vena cava under fluoroscopic  guidance.  This was done without difficulty.  A small incision was made  at the  wire insertion site.  A transverse incision was made in the left  infraclavicular area about 2 cm below the wire insertion site and a  subcutaneous pocket was created.  The catheter was drawn between the  wire insertion site and the port pocket site with a tunneling device.  Fluoroscopy was used to mark the chest wall where the course of the  catheter would go and where the tip of the catheter should be in the  superior vena cava just above the right atrium.  The catheter was then  measured along the chest wall and cut in appropriate length and then  secured to the port with the locking device.  The port and catheter were  then flushed with heparinized saline.  The port was then sutured to the  pectoralis fascia with 3 interrupted sutures of 2-0 Prolene.  The port  was flushed one more time.  With the patient back in Trendelenburg  position, I inserted the dilator and peel-away sheath over the guidewire  into the central venous circulation and the dilator and  the guidewire  were removed.  We had good blood return.  I threaded the catheter  through the peel-away sheath and removed the peel-away sheath.  The  catheter flushed easily and had excellent blood return.  I flushed the  port and the catheter with concerned heparin.  Fluoroscopy was used once  again demonstrating good position of the catheter in the superior vena  cava and no deformity of the catheter anywhere along its course.  Subcutaneous tissue was closed with 3-0 Vicryl sutures and both skin  incisions were closed with  subcuticular sutures of 4-0 Monocryl and Steri-Strips.  Clean bandage  was placed, and the patient was taken to recovery room in stable  condition.  Estimated blood loss was about 10 mL.  Complications none.  Sponge, needle, and instrument counts were correct.  A chest x-ray is  ordered for the recovery room.      Angelia Mould. Derrell Lolling, M.D.  Electronically Signed     HMI/MEDQ  D:  06/16/2008  T:   06/16/2008  Job:  [id number]   cc:   Valentino Hue. Magrinat, M.D.  Genia Del, M.D.  Genene Churn. Love, M.D.

## 2010-05-31 ENCOUNTER — Other Ambulatory Visit: Payer: Self-pay | Admitting: Obstetrics & Gynecology

## 2010-06-05 ENCOUNTER — Other Ambulatory Visit: Payer: Managed Care, Other (non HMO)

## 2010-06-20 ENCOUNTER — Other Ambulatory Visit: Payer: Managed Care, Other (non HMO)

## 2010-06-26 ENCOUNTER — Other Ambulatory Visit: Payer: Self-pay | Admitting: Dermatology

## 2010-07-03 ENCOUNTER — Ambulatory Visit
Admission: RE | Admit: 2010-07-03 | Discharge: 2010-07-03 | Disposition: A | Discharge status: Home / Self Care | Payer: Managed Care, Other (non HMO) | Source: Ambulatory Visit | Attending: Oncology | Admitting: Oncology

## 2010-07-03 DIAGNOSIS — Z9889 Other specified postprocedural states: Secondary | ICD-10-CM

## 2010-07-03 DIAGNOSIS — R922 Inconclusive mammogram: Secondary | ICD-10-CM

## 2010-07-03 MED ORDER — GADOBENATE DIMEGLUMINE 529 MG/ML IV SOLN
15.0000 mL | Freq: Once | INTRAVENOUS | Status: AC | PRN
  Administered 2010-07-03: 15 mL via INTRAVENOUS

## 2010-08-01 ENCOUNTER — Other Ambulatory Visit (HOSPITAL_COMMUNITY): Payer: Managed Care, Other (non HMO)

## 2010-08-01 ENCOUNTER — Encounter (HOSPITAL_COMMUNITY)
Admission: RE | Admit: 2010-08-01 | Discharge: 2010-08-01 | Disposition: A | Discharge status: Home / Self Care | Payer: Managed Care, Other (non HMO) | Source: Ambulatory Visit | Attending: Oncology | Admitting: Oncology

## 2010-08-01 ENCOUNTER — Encounter (HOSPITAL_COMMUNITY): Payer: Self-pay

## 2010-08-01 DIAGNOSIS — Z79899 Other long term (current) drug therapy: Secondary | ICD-10-CM | POA: Insufficient documentation

## 2010-08-01 DIAGNOSIS — C50919 Malignant neoplasm of unspecified site of unspecified female breast: Secondary | ICD-10-CM

## 2010-08-01 DIAGNOSIS — M25559 Pain in unspecified hip: Secondary | ICD-10-CM | POA: Insufficient documentation

## 2010-08-01 DIAGNOSIS — M25519 Pain in unspecified shoulder: Secondary | ICD-10-CM | POA: Insufficient documentation

## 2010-08-01 HISTORY — DX: Malignant (primary) neoplasm, unspecified: C80.1

## 2010-08-01 MED ORDER — TECHNETIUM TC 99M MEDRONATE IV KIT
24.0000 | PACK | Freq: Once | INTRAVENOUS | Status: AC | PRN
  Administered 2010-08-01: 24 via INTRAVENOUS

## 2010-08-14 ENCOUNTER — Other Ambulatory Visit: Payer: Self-pay | Admitting: Oncology

## 2010-08-14 ENCOUNTER — Encounter (HOSPITAL_BASED_OUTPATIENT_CLINIC_OR_DEPARTMENT_OTHER): Payer: Managed Care, Other (non HMO) | Admitting: Oncology

## 2010-08-14 DIAGNOSIS — C50119 Malignant neoplasm of central portion of unspecified female breast: Secondary | ICD-10-CM

## 2010-08-14 LAB — CBC WITH DIFFERENTIAL/PLATELET
BASO%: 0.5 % (ref 0.0–2.0)
EOS%: 2.1 % (ref 0.0–7.0)
LYMPH%: 31.4 % (ref 14.0–49.7)
MCH: 33.9 pg (ref 25.1–34.0)
MCHC: 34.3 g/dL (ref 31.5–36.0)
MCV: 99 fL (ref 79.5–101.0)
MONO%: 6.5 % (ref 0.0–14.0)
Platelets: 135 10*3/uL — ABNORMAL LOW (ref 145–400)
RBC: 4.08 10*6/uL (ref 3.70–5.45)
RDW: 12.4 % (ref 11.2–14.5)

## 2010-08-15 LAB — COMPREHENSIVE METABOLIC PANEL
ALT: 12 U/L (ref 0–35)
AST: 18 U/L (ref 0–37)
Alkaline Phosphatase: 60 U/L (ref 39–117)
Glucose, Bld: 85 mg/dL (ref 70–99)
Sodium: 139 mEq/L (ref 135–145)
Total Bilirubin: 0.4 mg/dL (ref 0.3–1.2)
Total Protein: 6.6 g/dL (ref 6.0–8.3)

## 2010-08-23 ENCOUNTER — Encounter (HOSPITAL_BASED_OUTPATIENT_CLINIC_OR_DEPARTMENT_OTHER): Payer: Managed Care, Other (non HMO) | Admitting: Oncology

## 2010-08-23 ENCOUNTER — Other Ambulatory Visit: Payer: Self-pay | Admitting: Oncology

## 2010-08-23 ENCOUNTER — Ambulatory Visit (HOSPITAL_COMMUNITY)
Admission: RE | Admit: 2010-08-23 | Discharge: 2010-08-23 | Disposition: A | Discharge status: Home / Self Care | Payer: Managed Care, Other (non HMO) | Source: Ambulatory Visit | Attending: Oncology | Admitting: Oncology

## 2010-08-23 DIAGNOSIS — Z853 Personal history of malignant neoplasm of breast: Secondary | ICD-10-CM

## 2010-08-23 DIAGNOSIS — C50919 Malignant neoplasm of unspecified site of unspecified female breast: Secondary | ICD-10-CM | POA: Insufficient documentation

## 2010-08-23 DIAGNOSIS — Z171 Estrogen receptor negative status [ER-]: Secondary | ICD-10-CM

## 2010-08-23 DIAGNOSIS — G589 Mononeuropathy, unspecified: Secondary | ICD-10-CM

## 2010-08-23 DIAGNOSIS — C50119 Malignant neoplasm of central portion of unspecified female breast: Secondary | ICD-10-CM

## 2010-11-28 ENCOUNTER — Ambulatory Visit: Payer: Managed Care, Other (non HMO)

## 2010-11-28 ENCOUNTER — Ambulatory Visit (INDEPENDENT_AMBULATORY_CARE_PROVIDER_SITE_OTHER): Payer: Managed Care, Other (non HMO)

## 2010-11-28 DIAGNOSIS — Z23 Encounter for immunization: Secondary | ICD-10-CM

## 2010-12-15 ENCOUNTER — Telehealth: Payer: Self-pay | Admitting: Oncology

## 2010-12-15 NOTE — Telephone Encounter (Signed)
per pof 08/16 called pts home lmovm for appt for feb2013. asked pt to rtn call to confirm appts

## 2011-01-09 ENCOUNTER — Encounter: Payer: Managed Care, Other (non HMO) | Admitting: Family Medicine

## 2011-01-15 ENCOUNTER — Other Ambulatory Visit: Payer: Self-pay | Admitting: Obstetrics & Gynecology

## 2011-01-15 DIAGNOSIS — N63 Unspecified lump in unspecified breast: Secondary | ICD-10-CM

## 2011-01-22 ENCOUNTER — Other Ambulatory Visit: Payer: Self-pay | Admitting: Oncology

## 2011-01-22 ENCOUNTER — Ambulatory Visit
Admission: RE | Admit: 2011-01-22 | Discharge: 2011-01-22 | Disposition: A | Discharge status: Home / Self Care | Payer: Managed Care, Other (non HMO) | Source: Ambulatory Visit | Attending: Obstetrics & Gynecology | Admitting: Obstetrics & Gynecology

## 2011-01-22 ENCOUNTER — Ambulatory Visit
Admission: RE | Admit: 2011-01-22 | Discharge: 2011-01-22 | Disposition: A | Discharge status: Home / Self Care | Payer: Managed Care, Other (non HMO) | Source: Ambulatory Visit | Attending: Oncology | Admitting: Oncology

## 2011-01-22 DIAGNOSIS — N63 Unspecified lump in unspecified breast: Secondary | ICD-10-CM

## 2011-01-22 DIAGNOSIS — C50919 Malignant neoplasm of unspecified site of unspecified female breast: Secondary | ICD-10-CM

## 2011-01-23 ENCOUNTER — Telehealth: Payer: Self-pay | Admitting: Oncology

## 2011-01-23 NOTE — Telephone Encounter (Signed)
pt rtn call and confirmed appts for feb2013 °

## 2011-02-14 ENCOUNTER — Other Ambulatory Visit (HOSPITAL_BASED_OUTPATIENT_CLINIC_OR_DEPARTMENT_OTHER): Payer: Managed Care, Other (non HMO) | Admitting: Lab

## 2011-02-14 DIAGNOSIS — C50119 Malignant neoplasm of central portion of unspecified female breast: Secondary | ICD-10-CM

## 2011-02-14 DIAGNOSIS — D696 Thrombocytopenia, unspecified: Secondary | ICD-10-CM

## 2011-02-14 LAB — CBC WITH DIFFERENTIAL/PLATELET
BASO%: 0.7 % (ref 0.0–2.0)
Basophils Absolute: 0 10*3/uL (ref 0.0–0.1)
EOS%: 4 % (ref 0.0–7.0)
HCT: 38.4 % (ref 34.8–46.6)
HGB: 13.2 g/dL (ref 11.6–15.9)
LYMPH%: 33.1 % (ref 14.0–49.7)
MCH: 33.5 pg (ref 25.1–34.0)
MCHC: 34.4 g/dL (ref 31.5–36.0)
MCV: 97.3 fL (ref 79.5–101.0)
MONO%: 7.2 % (ref 0.0–14.0)
NEUT%: 55 % (ref 38.4–76.8)
Platelets: 160 10*3/uL (ref 145–400)
lymph#: 1.6 10*3/uL (ref 0.9–3.3)

## 2011-02-14 LAB — COMPREHENSIVE METABOLIC PANEL
ALT: 11 U/L (ref 0–35)
AST: 17 U/L (ref 0–37)
BUN: 11 mg/dL (ref 6–23)
Calcium: 9.3 mg/dL (ref 8.4–10.5)
Chloride: 101 mEq/L (ref 96–112)
Creatinine, Ser: 0.9 mg/dL (ref 0.50–1.10)
Total Bilirubin: 0.4 mg/dL (ref 0.3–1.2)

## 2011-02-21 ENCOUNTER — Telehealth: Payer: Self-pay | Admitting: Oncology

## 2011-02-21 ENCOUNTER — Ambulatory Visit (HOSPITAL_BASED_OUTPATIENT_CLINIC_OR_DEPARTMENT_OTHER): Payer: Managed Care, Other (non HMO) | Admitting: Oncology

## 2011-02-21 VITALS — BP 118/74 | HR 59 | Temp 98.0°F | Ht 67.5 in | Wt 172.0 lb

## 2011-02-21 DIAGNOSIS — Z853 Personal history of malignant neoplasm of breast: Secondary | ICD-10-CM

## 2011-02-21 DIAGNOSIS — C50919 Malignant neoplasm of unspecified site of unspecified female breast: Secondary | ICD-10-CM

## 2011-02-21 NOTE — Telephone Encounter (Signed)
gve the pt her aug 2013 appt calendar 

## 2011-02-21 NOTE — Progress Notes (Signed)
ID: Jeanette Boone   DOB: 03/11/1960  MR#: [id number]  WUJ#:[id number]  HISTORY OF PRESENT ILLNESS: She herself palpated a mass in her right breast in 04/2008.  The patient tells me that she had lost approximately 100 pounds by diet and exercise over the past year, and that this made her breasts lumpier feeling.  In any case, she brought this question to Dr. Awilda Metro Lavoie's attention, and was set up for mammography at [facility] on 04/21/2008.  She had had a mammogram in 09/2007, which had been unremarkable.  The mammogram on 05/01/2008 showed a dense right breast with a suggestion of a possible mass in the upper central breast corresponding to the palpable abnormality.  Ultrasound was more revealing, and did show an irregularly bordered hypoechoic solid mass measuring 1.2 cm.  The patient was brought back for biopsy and clip placement on 04/23, and the pathology from that procedure ([id number] and [id number]) showed an invasive ductal carcinoma which was negative for the estrogen and progesterone receptor at 0 and 0 respectively.  The Ki-67 was 19%, and CISH was amplified with a ratio of 2.22 (this is just above the "indeterminate" range).  With this information, the patient was referred to Dr. Derrell Lolling, and bilateral breast MRIs were obtained on 04/30.  This showed in the right breast an enhancing rounded mass measuring up to 1.8 cm.  The left breast was unremarkable, and there were no enlarged or suspicious internal mammary or axillary nodes on either side.  With this information, the patient proceeded to definitive right lumpectomy and sentinel lymph node sampling on 05/16/2008.  The final pathology ([id number]) confirmed a 1.5 cm invasive ductal carcinoma, grade 2, with negative margins, no evidence of lymphovascular invasion, and 0 of 3 sentinel lymph nodes involved.  The estrogen receptor test was repeated ([id number]), and was again negative with a strong positive control.    INTERVAL HISTORY: Bessye  returns for followup of her breast cancer. 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 Family is doing fine. 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 Cancer   1. Obesity.  As stated, the patient has been on a very strict 1200-1400 calorie diet and 5 days of exercise for over a year, and tells me she has lost over 100 pounds.  She tells me she has done this in the past, and that she loses weight easily, but also gains weight easily.  She is now a bit below where she would like to be, her goal being approximately 140 pounds. 2. She had an episode of cholecystitis, and underwent cholecystectomy on 06/01/2008 under Dr. Derrell Lolling.  The pathology there ([id number]) showed only chronic cholecystitis and cholelithiasis with a benign cystic duct lymph node. 3. The patient has a history of endometriosis. 4. Status post umbilical hernia repair. 5. The patient tried for many years to get pregnant and took "large doses of fertility drugs" for about 8 or 9 years before giving up on those attempts.       6.   History of cold sores, and takes chronic suppressive therapy with Valtrex.    PAST SURGICAL HISTORY: S/p cholecystectomy  FAMILY HISTORY The patient's father died at the age of 29 from causes not quite clear to the patient, but he certainly had significant heart disease.  The patient's mother is alive at age  76.  She has a history of bladder cancer.  Both the patient's parents were single children.  The patient has one sister and two brothers.  There is no history of breast cancer in the immediate family.  GYNECOLOGIC HISTORY: She is GX, P0.  Periods now occur about every 3 months.  She does not have significant premenstrual symptoms.  SOCIAL HISTORY: Mylani works as a Doctor, general practice.  Her husband, [name], is an Art gallery manager.  Their two children were adopted from  [place].  [name] is 7, and [name] is 4.  The patient attends the [employer].    ADVANCED DIRECTIVES:  HEALTH MAINTENANCE: History  Substance Use Topics  . Smoking status: Not on file  . Smokeless tobacco: Not on file  . Alcohol Use: Not on file     Colonoscopy:  PAP:  Bone density:  Lipid panel:  Allergies  Allergen Reactions  . Clarithromycin   . Penicillins     Current Outpatient Prescriptions  Medication Sig Dispense Refill  . cholecalciferol (VITAMIN D) 1000 UNITS tablet Take 2,000 Units by mouth daily.      . diphenhydrAMINE (BENADRYL) 25 MG tablet Take 25 mg by mouth at bedtime as needed.      . DULoxetine (CYMBALTA) 30 MG capsule Take 30 mg by mouth daily.      Marland Kitchen gabapentin (NEURONTIN) 300 MG capsule Take 900 mg by mouth 4 (four) times daily.      . Multiple Vitamin (MULTIVITAMIN) tablet Take 1 tablet by mouth daily.      . progesterone (PROMETRIUM) 100 MG capsule Take 100 mg by mouth daily. q 2 months if no period      . valACYclovir (VALTREX) 500 MG tablet Take 500 mg by mouth 2 (two) times daily.        REVIEW OF SYSTEMS: The hyperesthesia symptoms are pretty much the same. They are not worse, possibly a little better. She is taking gabapentin at 3600 mg daily and Cymbalta as well. She finds this helps. She is not exercising regularly or really at all at this point. This is more for lack of time than any other reason. A detailed review of systems was otherwise noncontributory  OBJECTIVE: Filed Vitals:   02/21/11 1215  BP: 118/74  Pulse: 59  Temp: 98 F (36.7 C)     Body mass index is 26.54 kg/(m^2).    ECOG FS: 0 Sclerae unicteric Oropharynx clear No peripheral adenopathy Lungs no rales or rhonchi Heart regular rate and rhythm Abd benign MSK no focal spinal tenderness, no peripheral edema Neuro: nonfocal Breasts: Right breast status post lumpectomy and radiation. There is no evidence of local recurrence. Left breast no suspicious  masses.  LAB RESULTS: Lab Results  Component Value Date   WBC 4.9 02/14/2011   NEUTROABS 2.7 02/14/2011   HGB 13.2 02/14/2011   HCT 38.4 02/14/2011   MCV 97.3 02/14/2011   PLT 160 02/14/2011      Chemistry      Component Value Date/Time   NA 137 02/14/2011 1345   K 4.3 02/14/2011 1345   CL 101 02/14/2011 1345   CO2 28 02/14/2011 1345   BUN 11 02/14/2011 1345   CREATININE 0.90 02/14/2011 1345      Component Value Date/Time   CALCIUM 9.3 02/14/2011 1345   ALKPHOS 69 02/14/2011 1345   AST 17 02/14/2011 1345   ALT 11 02/14/2011 1345   BILITOT 0.4 02/14/2011 1345       No results found for  this basename: INR:1;PROTIME:1 in the last 168 hours  No results found for this basename: UACOL:1,UAPR:1,USPG:1,UPH:1,UTP:1,UGL:1,UKET:1,UBIL:1,UHGB:1,UNIT:1,UROB:1,ULEU:1,UEPI:1,UWBC:1,URBC:1,UBAC:1,CAST:1,CRYS:1,UCOM:1,BILUA:1 in the last 72 hours   STUDIES: US Breast Right  01/22/2011  *RADIOLOGY REPORT*  Clinical Data:  Palpable right breast masses  DIGITAL DIAGNOSTIC RIGHT MAMMOGRAM WITH CAD AND RIGHT BREAST ULTRASOUND:  Comparison:  Multiple priors  Findings:  The breast tissue is heterogeneously dense. The patient has had a right lumpectomy.  No mass or malignant type calcifications are seen.  Glandular tissue is imaged on spot tangential views in the areas of concern in the upper inner and upper-outer quadrants of the right breast, middle third.  Compared to priors. Mammographic images were processed with CAD.  On physical exam, I see an lumpectomy scar in the upper inner quadrant of the right breast, 3 cm the nipple.  No mass is palpated in the upper inner or upper-outer quadrant of the breast.  Ultrasound is performed, showing normal tissue in the upper inner and upper-outer quadrant of the right breast.  IMPRESSION: No sonographic or mammographic evidence of malignancy, right breast.  The patient is due for diagnostic mammography in May, 2013.  BI-RADS CATEGORY 3:  Probably benign finding(s) - short interval follow-up  suggested.  Original Report Authenticated By: Hiram Gash, M.D.   Mm Digital Diagnostic Unilat R  01/22/2011  *RADIOLOGY REPORT*  Clinical Data:  Palpable right breast masses  DIGITAL DIAGNOSTIC RIGHT MAMMOGRAM WITH CAD AND RIGHT BREAST ULTRASOUND:  Comparison:  Multiple priors  Findings:  The breast tissue is heterogeneously dense. The patient has had a right lumpectomy.  No mass or malignant type calcifications are seen.  Glandular tissue is imaged on spot tangential views in the areas of concern in the upper inner and upper-outer quadrants of the right breast, middle third.  Compared to priors. Mammographic images were processed with CAD.  On physical exam, I see an lumpectomy scar in the upper inner quadrant of the right breast, 3 cm the nipple.  No mass is palpated in the upper inner or upper-outer quadrant of the breast.  Ultrasound is performed, showing normal tissue in the upper inner and upper-outer quadrant of the right breast.  IMPRESSION: No sonographic or mammographic evidence of malignancy, right breast.  The patient is due for diagnostic mammography in May, 2013.  BI-RADS CATEGORY 3:  Probably benign finding(s) - short interval follow-up suggested.  Original Report Authenticated By: Hiram Gash, M.D.    ASSESSMENT: 51 year old BRCA 1/2 negative [place] woman status post right lumpectomy and sentinel lymph node dissection May 2010 for a T1c N0 grade 2 invasive ductal carcinoma which was estrogen and progesterone receptor negative, but HER-2/neu positive.  The MIB-1 was 19%.  After 4 cycles of docetaxel/carboplatin/trastuzumab, she continued Herceptin to July 2010 with echocardiogram showing a well-preserved ejection fraction after the completion of treatment.  She completed radiation in December 2010.   PLAN: There is no evidence of disease recurrence now nearly 3 years out from her definitive surgery. She will be seeing Dr. Arne Cleveland in about 3 months. She will see me again in 6  months. She will have routine mammography in May of this year. She knows to call for any problems that may develop before the next visit   Bindu Docter C    02/21/2011

## 2011-05-13 ENCOUNTER — Encounter (INDEPENDENT_AMBULATORY_CARE_PROVIDER_SITE_OTHER): Payer: Self-pay | Admitting: General Surgery

## 2011-05-30 ENCOUNTER — Other Ambulatory Visit: Payer: Self-pay | Admitting: Obstetrics & Gynecology

## 2011-05-30 DIAGNOSIS — Z9889 Other specified postprocedural states: Secondary | ICD-10-CM

## 2011-05-30 DIAGNOSIS — Z853 Personal history of malignant neoplasm of breast: Secondary | ICD-10-CM

## 2011-06-11 ENCOUNTER — Ambulatory Visit
Admission: RE | Admit: 2011-06-11 | Discharge: 2011-06-11 | Disposition: A | Discharge status: Home / Self Care | Payer: Managed Care, Other (non HMO) | Source: Ambulatory Visit | Attending: Obstetrics & Gynecology | Admitting: Obstetrics & Gynecology

## 2011-06-11 DIAGNOSIS — Z9889 Other specified postprocedural states: Secondary | ICD-10-CM

## 2011-06-11 DIAGNOSIS — Z853 Personal history of malignant neoplasm of breast: Secondary | ICD-10-CM

## 2011-07-29 ENCOUNTER — Ambulatory Visit (INDEPENDENT_AMBULATORY_CARE_PROVIDER_SITE_OTHER): Payer: Managed Care, Other (non HMO) | Admitting: General Surgery

## 2011-07-29 ENCOUNTER — Encounter (INDEPENDENT_AMBULATORY_CARE_PROVIDER_SITE_OTHER): Payer: Self-pay | Admitting: General Surgery

## 2011-07-29 VITALS — BP 126/74 | HR 72 | Temp 97.6°F | Resp 14 | Ht 67.5 in | Wt 181.1 lb

## 2011-07-29 DIAGNOSIS — C50919 Malignant neoplasm of unspecified site of unspecified female breast: Secondary | ICD-10-CM

## 2011-07-29 NOTE — Patient Instructions (Signed)
Your physical exam today shows no abnormal findings. Minimal thickening at the lumpectomy site. No evidence of cancer by physical exam.  Your mammograms performed in May of this year looked good, no focal abnormalities.  I recommend that you get bilateral diagnostic mammograms and bilateral breast MRIs next May or June.  Return to see Dr. Derrell Lolling in July 2014.

## 2011-07-29 NOTE — Progress Notes (Signed)
Patient ID: Jeanette Boone, female   DOB: 02/23/1960, 51 y.o.   MRN: [id number]  Chief Complaint  Patient presents with  . Breast Cancer Long Term Follow Up    HPI Jeanette Boone is a 51 y.o. female.  She returns for long-term followup regarding her right breast cancer.  On 05/16/2008 she underwent right partial mastectomy and sentinel node biopsy. The tumor was pathologic stage T1c, ., N0, receptor-negative, HER-2 positive, invasive ductal carcinoma. I inserted a Port-A-Cath on 06/16/2008. She underwent a Herceptin-based chemotherapy. Port-A-Cath was removed on 09/09/2009. She has no evidence of recurrent disease to date.  She was  concerned about  thickening in her lumpectomy scar in January of this year. She states an ultrasound was done she was told everything looked normal.  Bilateral breast MRI was performed last year, 07/03/2010 and looked good. Bilateral diagnostic mammograms were performed this year, 05/30/2011 these are category 2, no focal abnormality. She has very dense breasts and bi-annual MRIs have been recommended by radiology, and I agree.  She sees Dr. Darnelle Catalan every 6 months.  We have discussed referral to plastic surgery for reduction mammoplasty to achieve symmetry and she has declined that again today. HPI  Past Medical History  Diagnosis Date  . Cancer     breast    Past Surgical History  Procedure Date  . Hernia repair 1995    umbilical  . Hernia repair 1996  . Breast surgery 05/2008    lumpectomy - right  . Cholecystectomy 05/2008  . Uterine polyp removal 06/2008  . Colonoscopy 08/2009  . Port a cath insertion 06/2008    History reviewed. No pertinent family history.  Social History History  Substance Use Topics  . Smoking status: Never Smoker   . Smokeless tobacco: Never Used  . Alcohol Use: Yes     1 or 2 per week    Allergies  Allergen Reactions  . Clarithromycin   . Penicillins     Current Outpatient Prescriptions  Medication Sig Dispense  Refill  . cholecalciferol (VITAMIN D) 1000 UNITS tablet Take 2,000 Units by mouth daily.      . diphenhydrAMINE (BENADRYL) 25 MG tablet Take 25 mg by mouth at bedtime as needed.      . DULoxetine (CYMBALTA) 30 MG capsule Take 30 mg by mouth daily.      Marland Kitchen gabapentin (NEURONTIN) 300 MG capsule Take 900 mg by mouth 4 (four) times daily.      . Multiple Vitamin (MULTIVITAMIN) tablet Take 1 tablet by mouth daily.      . valACYclovir (VALTREX) 500 MG tablet Take 500 mg by mouth 2 (two) times daily.        Review of Systems Review of Systems  Constitutional: Negative for fever, chills and unexpected weight change.  HENT: Negative for hearing loss, congestion, sore throat, trouble swallowing and voice change.   Eyes: Negative for visual disturbance.  Respiratory: Negative for cough and wheezing.   Cardiovascular: Negative for chest pain, palpitations and leg swelling.  Gastrointestinal: Negative for nausea, vomiting, abdominal pain, diarrhea, constipation, blood in stool, abdominal distention and anal bleeding.  Genitourinary: Negative for hematuria, vaginal bleeding and difficulty urinating.  Musculoskeletal: Negative for arthralgias.  Skin: Negative for rash and wound.  Neurological: Negative for seizures, syncope and headaches.  Hematological: Negative for adenopathy. Does not bruise/bleed easily.  Psychiatric/Behavioral: Negative for confusion.    Blood pressure 126/74, pulse 72, temperature 97.6 F (36.4 C), temperature source Temporal, resp. rate 14, height 5'  7.5" (1.715 m), weight 181 lb 2 oz (82.158 kg).  Physical Exam Physical Exam  Constitutional: She is oriented to person, place, and time. She appears well-developed and well-nourished. No distress.  HENT:  Head: Normocephalic and atraumatic.  Eyes: Conjunctivae and EOM are normal. Pupils are equal, round, and reactive to light.  Neck: Neck supple. No JVD present. No tracheal deviation present. No thyromegaly present.    Cardiovascular: Normal rate, regular rhythm, normal heart sounds and intact distal pulses.   No murmur heard. Pulmonary/Chest: Effort normal and breath sounds normal. No respiratory distress. She has no wheezes. She has no rales. She exhibits no tenderness.       Right breast is somewhat smaller than left breast. Both are moderately large and ptotic. Well-healed scar right breast upper inner quadrant and right axillary area. No palpable mass in either breast. No other skin change. Good range of motion right shoulder. No arm swelling.  Musculoskeletal: She exhibits no edema and no tenderness.  Lymphadenopathy:    She has no cervical adenopathy.  Neurological: She is alert and oriented to person, place, and time. She exhibits normal muscle tone. Coordination normal.  Skin: Skin is warm. No rash noted. She is not diaphoretic. No erythema. No pallor.  Psychiatric: She has a normal mood and affect. Her behavior is normal. Judgment and thought content normal.    Data Reviewed I reviewed my old records, all the imaging studies, and Dr. Darrall Dears office notes.  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  No evidence of recurrent disease 3 years following right partial mastectomy, sentinel lobe biopsy, radiation therapy, and Herceptin-based chemotherapy  Very dense breast.    Plan    She was reassured that her physical exam is normal.  Recommend bilateral diagnostic mammograms and bilateral breast MRI next May or June  Return to see me in July 2014.  Continue regular followup with Dr. Darnelle Catalan.        Angelia Mould. Derrell Lolling, M.D., [facility] General and Minimally invasive Surgery Breast and Colorectal Surgery Office:   [phone] Pager:   [phone]  07/29/2011, 3:13 PM

## 2011-09-02 ENCOUNTER — Other Ambulatory Visit (HOSPITAL_BASED_OUTPATIENT_CLINIC_OR_DEPARTMENT_OTHER): Payer: Managed Care, Other (non HMO) | Admitting: Lab

## 2011-09-02 DIAGNOSIS — C50119 Malignant neoplasm of central portion of unspecified female breast: Secondary | ICD-10-CM

## 2011-09-02 DIAGNOSIS — C50919 Malignant neoplasm of unspecified site of unspecified female breast: Secondary | ICD-10-CM

## 2011-09-02 DIAGNOSIS — D696 Thrombocytopenia, unspecified: Secondary | ICD-10-CM

## 2011-09-02 LAB — CBC & DIFF AND RETIC
Basophils Absolute: 0 10*3/uL (ref 0.0–0.1)
EOS%: 1.7 % (ref 0.0–7.0)
Eosinophils Absolute: 0.1 10*3/uL (ref 0.0–0.5)
HCT: 38.2 % (ref 34.8–46.6)
HGB: 13.3 g/dL (ref 11.6–15.9)
Immature Retic Fract: 3.2 % (ref 1.60–10.00)
MONO#: 0.4 10*3/uL (ref 0.1–0.9)
NEUT#: 3.3 10*3/uL (ref 1.5–6.5)
NEUT%: 62.1 % (ref 38.4–76.8)
RDW: 12 % (ref 11.2–14.5)
Retic Ct Abs: 39.36 10*3/uL (ref 33.70–90.70)
WBC: 5.3 10*3/uL (ref 3.9–10.3)
lymph#: 1.5 10*3/uL (ref 0.9–3.3)

## 2011-09-02 LAB — COMPREHENSIVE METABOLIC PANEL (CC13)
ALT: 14 U/L (ref 0–55)
AST: 16 U/L (ref 5–34)
CO2: 25 mEq/L (ref 22–29)
Calcium: 8.8 mg/dL (ref 8.4–10.4)
Chloride: 103 mEq/L (ref 98–107)
Creatinine: 0.9 mg/dL (ref 0.6–1.1)
Sodium: 137 mEq/L (ref 136–145)
Total Protein: 6.6 g/dL (ref 6.4–8.3)

## 2011-09-05 ENCOUNTER — Telehealth: Payer: Self-pay | Admitting: *Deleted

## 2011-09-05 ENCOUNTER — Ambulatory Visit (HOSPITAL_BASED_OUTPATIENT_CLINIC_OR_DEPARTMENT_OTHER): Payer: Managed Care, Other (non HMO) | Admitting: Oncology

## 2011-09-05 VITALS — BP 123/80 | HR 67 | Temp 98.5°F | Resp 20 | Ht 67.5 in | Wt 183.3 lb

## 2011-09-05 DIAGNOSIS — Z171 Estrogen receptor negative status [ER-]: Secondary | ICD-10-CM

## 2011-09-05 DIAGNOSIS — C50219 Malignant neoplasm of upper-inner quadrant of unspecified female breast: Secondary | ICD-10-CM

## 2011-09-05 DIAGNOSIS — C50919 Malignant neoplasm of unspecified site of unspecified female breast: Secondary | ICD-10-CM

## 2011-09-05 MED ORDER — DULOXETINE HCL 30 MG PO CPEP: 30.0000 mg | ORAL_CAPSULE | Freq: Every day | ORAL | Status: DC

## 2011-09-05 MED ORDER — GABAPENTIN 300 MG PO CAPS: 900.0000 mg | ORAL_CAPSULE | Freq: Four times a day (QID) | ORAL | Status: DC

## 2011-09-05 NOTE — Progress Notes (Signed)
ID: Vinetta Bergamo   DOB: Nov 25, 1960  MR#: [id number]  WUJ#:[id number]  PCP: Loreen Freud MD GYN: Genia Del MD SU: Claud Kelp MD OTHER MD: Avie Echevaria, Antony Blackbird, Elmon Else  HISTORY OF PRESENT ILLNESS: She herself palpated a mass in her right breast in 04/2008.  The patient tells me that she had lost approximately 100 pounds by diet and exercise over the past year, and that this made her breasts lumpier feeling.  In any case, she brought this question to Dr. Awilda Metro Lavoie's attention, and was set up for mammography at [facility] on 04/21/2008.  She had had a mammogram in 09/2007, which had been unremarkable.  The mammogram on 05/01/2008 showed a dense right breast with a suggestion of a possible mass in the upper central breast corresponding to the palpable abnormality.  Ultrasound was more revealing, and did show an irregularly bordered hypoechoic solid mass measuring 1.2 cm.  The patient was brought back for biopsy and clip placement on 04/23, and the pathology from that procedure ([id number] and [id number]) showed an invasive ductal carcinoma which was negative for the estrogen and progesterone receptor at 0 and 0 respectively.  The Ki-67 was 19%, and CISH was amplified with a ratio of 2.22 (this is just above the "indeterminate" range).  Bilateral breast MRIs were obtained on 04/30.  This showed in the right breast an enhancing rounded mass measuring up to 1.8 cm.  The left breast was unremarkable, and there were no enlarged or suspicious internal mammary or axillary nodes on either side.  With this information, the patient proceeded to definitive right lumpectomy and sentinel lymph node sampling on 05/16/2008.  The final pathology ([id number]) confirmed a 1.5 cm invasive ductal carcinoma, grade 2, with negative margins, no evidence of lymphovascular invasion, and 0 of 3 sentinel lymph nodes involved.  The estrogen receptor test was repeated ([id number]), and was again negative with a  strong positive control. Her subsequent history is as detailed below.   INTERVAL HISTORY: Anari returns for followup of her breast cancer. The interval history is unremarkable. She continues to work full-time, do her "mom and wife work", and in general she has learned that the continuing neurologic symptoms she experiences are not life-threatening and so she does her best to ignore them.   REVIEW OF SYSTEMS: She is still on and 900 mg of gabapentin 4 times a day, and she tells me every time she tries to cut back she gets the same sensations of pins and needles and overall burning that she had initially. At this dose she hasn't dosing symptoms but minimally. She is sleeping okay eating more than she thinks she should, having too much caffeine, and not exercising like she is supposed to. "I'm just too busy". A detailed review of systems was otherwise noncontributory.   PAST MEDICAL HISTORY: Past Medical History  Diagnosis Date  . Cancer     breast  1. Obesity.  As stated, the patient has been on a very strict 1200-1400 calorie diet and 5 days of exercise for over a year, and tells me she has lost over 100 pounds.  She tells me she has done this in the past, and that she loses weight easily, but also gains weight easily.  She is now a bit below where she would like to be, her goal being approximately 140 pounds. 2. She had an episode of cholecystitis, and underwent cholecystectomy on 06/01/2008 under Dr. Derrell Lolling.  The pathology there ([id number]) showed only chronic cholecystitis  and cholelithiasis with a benign cystic duct lymph node. 3. The patient has a history of endometriosis. 4. Status post umbilical hernia repair. 5. The patient tried for many years to get pregnant and took "large doses of fertility drugs" for about 8 or 9 years before giving up on those attempts.       6.   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          7.  History of poorly understood  neurologic symptom of  hyperesthesia, controlled on neurontin    PAST SURGICAL HISTORY: S/p cholecystectomy  FAMILY HISTORY The patient's father died at the age of 18 from causes not quite clear to the patient, but he certainly had significant heart disease.  The patient's mother is alive.  She has a history of bladder cancer.  Both the patient's parents were single children.  The patient has one sister and two brothers.  There is no history of breast cancer in the immediate family.  GYNECOLOGIC HISTORY: She is GX, P0. She was having regular periods at the time of her cancer diagnosis, these were interrupted by chemotherapy, then resumed. Her periods are now irregular, the most recent one being January 2013. She is having mild hot flashes.  SOCIAL HISTORY: Adisa works as a Field seismologist.  Her husband, [name], is an Art gallery manager.  Their two children, Encarnacion Chu,  were adopted from [place].  The patient attends the [employer].    ADVANCED DIRECTIVES:  HEALTH MAINTENANCE: History  Substance Use Topics  . Smoking status: Never Smoker   . Smokeless tobacco: Never Used  . Alcohol Use: Yes     1 or 2 per week     Colonoscopy: 2011  PAP: UTD  Bone density:  Lipid panel:  Allergies  Allergen Reactions  . Clarithromycin   . Penicillins     Current Outpatient Prescriptions  Medication Sig Dispense Refill  . cholecalciferol (VITAMIN D) 1000 UNITS tablet Take 2,000 Units by mouth daily.      . diphenhydrAMINE (BENADRYL) 25 MG tablet Take 25 mg by mouth at bedtime as needed.      . DULoxetine (CYMBALTA) 30 MG capsule Take 30 mg by mouth daily.      Marland Kitchen gabapentin (NEURONTIN) 300 MG capsule Take 900 mg by mouth 4 (four) times daily.      . Multiple Vitamin (MULTIVITAMIN) tablet Take 1 tablet by mouth daily.      . valACYclovir (VALTREX) 500 MG tablet Take 500 mg by mouth 2 (two) times daily.        OBJECTIVE: Middle-aged white woman who appears well Filed Vitals:    09/05/11 1207  BP: 123/80  Pulse: 67  Temp: 98.5 F (36.9 C)  Resp: 20     Body mass index is 28.28 kg/(m^2).    ECOG FS: 0 Sclerae unicteric Oropharynx clear No peripheral adenopathy Lungs no rales or rhonchi Heart regular rate and rhythm Abd benign MSK no focal spinal tenderness, no peripheral edema Neuro: nonfocal Breasts: Right breast status post lumpectomy and radiation. There is no evidence of local recurrence. Left breast no suspicious masses.  LAB RESULTS: Lab Results  Component Value Date   WBC 5.3 09/02/2011   NEUTROABS 3.3 09/02/2011   HGB 13.3 09/02/2011   HCT 38.2 09/02/2011   MCV 93.2 09/02/2011   PLT 181 09/02/2011      Chemistry      Component Value Date/Time   NA 137  09/02/2011 1227   NA 137 02/14/2011 1345   K 4.4 09/02/2011 1227   K 4.3 02/14/2011 1345   CL 103 09/02/2011 1227   CL 101 02/14/2011 1345   CO2 25 09/02/2011 1227   CO2 28 02/14/2011 1345   BUN 11.0 09/02/2011 1227   BUN 11 02/14/2011 1345   CREATININE 0.9 09/02/2011 1227   CREATININE 0.90 02/14/2011 1345      Component Value Date/Time   CALCIUM 8.8 09/02/2011 1227   CALCIUM 9.3 02/14/2011 1345   ALKPHOS 72 09/02/2011 1227   ALKPHOS 69 02/14/2011 1345   AST 16 09/02/2011 1227   AST 17 02/14/2011 1345   ALT 14 09/02/2011 1227   ALT 11 02/14/2011 1345   BILITOT 0.60 09/02/2011 1227   BILITOT 0.4 02/14/2011 1345       No results found for this basename: INR:1;PROTIME:1 in the last 168 hours  No results found for this basename: UACOL:1,UAPR:1,USPG:1,UPH:1,UTP:1,UGL:1,UKET:1,UBIL:1,UHGB:1,UNIT:1,UROB:1,ULEU:1,UEPI:1,UWBC:1,URBC:1,UBAC:1,CAST:1,CRYS:1,UCOM:1,BILUA:1 in the last 72 hours   STUDIES: No results found. Most recent mammogram June 2013, unremarkable.   ASSESSMENT: 51 year old BRCA 1/2 negative [place] woman  (1) status post right lumpectomy and sentinel lymph node dissection May 2010 for a T1c N0, stage 1A invasive ductal carcinoma,  grade 2,  estrogen and progesterone receptor negative, but  HER-2/neu positive.  The MIB-1 was 19%.   (2) received 4 cycles of docetaxel/carboplatin/trastuzumab adjuvantly  (3) she continued Herceptin to July 2010 with echocardiogram showing a well-preserved ejection fraction after the completion of treatment.  (4) she completed radiation in December 2010.   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 I refilled her Cymbalta and gabapentin today. She will see [place] again in 6 months. She knows to call for any problems that may develop before the next visit.   MAGRINAT,GUSTAV C    09/05/2011

## 2011-09-05 NOTE — Telephone Encounter (Signed)
mailed out calenar to inform the patient of the new date and time

## 2011-11-20 ENCOUNTER — Ambulatory Visit: Payer: Managed Care, Other (non HMO)

## 2011-11-27 ENCOUNTER — Ambulatory Visit (INDEPENDENT_AMBULATORY_CARE_PROVIDER_SITE_OTHER): Payer: Managed Care, Other (non HMO)

## 2011-11-27 DIAGNOSIS — Z23 Encounter for immunization: Secondary | ICD-10-CM

## 2011-12-18 ENCOUNTER — Encounter: Payer: Self-pay | Admitting: Family Medicine

## 2011-12-18 ENCOUNTER — Ambulatory Visit (INDEPENDENT_AMBULATORY_CARE_PROVIDER_SITE_OTHER): Payer: Managed Care, Other (non HMO) | Admitting: Family Medicine

## 2011-12-18 VITALS — BP 120/70 | HR 65 | Temp 97.9°F | Ht 67.25 in | Wt 194.8 lb

## 2011-12-18 DIAGNOSIS — L659 Nonscarring hair loss, unspecified: Secondary | ICD-10-CM

## 2011-12-18 DIAGNOSIS — Z13220 Encounter for screening for lipoid disorders: Secondary | ICD-10-CM

## 2011-12-18 DIAGNOSIS — F411 Generalized anxiety disorder: Secondary | ICD-10-CM

## 2011-12-18 DIAGNOSIS — Z0000 Encounter for general adult medical examination without abnormal findings: Secondary | ICD-10-CM

## 2011-12-18 DIAGNOSIS — Z8249 Family history of ischemic heart disease and other diseases of the circulatory system: Secondary | ICD-10-CM

## 2011-12-18 DIAGNOSIS — C50919 Malignant neoplasm of unspecified site of unspecified female breast: Secondary | ICD-10-CM

## 2011-12-18 LAB — LIPID PANEL
HDL: 71.5 mg/dL (ref >39.00)
Total CHOL/HDL Ratio: 3
Triglycerides: 72 mg/dL (ref 0.0–149.0)
VLDL: 14.4 mg/dL (ref 0.0–40.0)

## 2011-12-18 LAB — POCT URINALYSIS DIPSTICK
Ketones, UA: NEGATIVE
Leukocytes, UA: NEGATIVE
Nitrite, UA: NEGATIVE
Protein, UA: NEGATIVE
Urobilinogen, UA: 0.2
pH, UA: 6

## 2011-12-18 LAB — HEPATIC FUNCTION PANEL
Alkaline Phosphatase: 83 U/L (ref 39–117)
Bilirubin, Direct: 0 mg/dL (ref 0.0–0.3)
Total Bilirubin: 0.6 mg/dL (ref 0.3–1.2)

## 2011-12-18 LAB — BASIC METABOLIC PANEL
BUN: 13 mg/dL (ref 6–23)
CO2: 32 mEq/L (ref 19–32)
Calcium: 9.1 mg/dL (ref 8.4–10.5)
Chloride: 103 mEq/L (ref 96–112)
Creatinine, Ser: 0.9 mg/dL (ref 0.4–1.2)

## 2011-12-18 LAB — CBC WITH DIFFERENTIAL/PLATELET
Basophils Absolute: 0 10*3/uL (ref 0.0–0.1)
Eosinophils Absolute: 0.1 10*3/uL (ref 0.0–0.7)
Lymphocytes Relative: 27.6 % (ref 12.0–46.0)
MCHC: 33.2 g/dL (ref 30.0–36.0)
MCV: 97.3 fl (ref 78.0–100.0)
Monocytes Absolute: 0.3 10*3/uL (ref 0.1–1.0)
Neutrophils Relative %: 62.8 % (ref 43.0–77.0)
Platelets: 234 10*3/uL (ref 150.0–400.0)
WBC: 5.1 10*3/uL (ref 4.5–10.5)

## 2011-12-18 LAB — LDL CHOLESTEROL, DIRECT: Direct LDL: 140.9 mg/dL

## 2011-12-18 NOTE — Assessment & Plan Note (Signed)
con't meds stable 

## 2011-12-18 NOTE — Addendum Note (Signed)
Addended by: Lelon Perla on: 12/18/2011 09:01 AM   Modules accepted: Orders

## 2011-12-18 NOTE — Progress Notes (Signed)
Subjective:     Jeanette Boone is a 51 y.o. female and is here for a comprehensive physical exam. The patient reports problems - hair loss---- nothing new.  History   Social History  . Marital Status: Married    Spouse Name: N/A    Number of Children: N/A  . Years of Education: N/A   Occupational History  . Not on file.   Social History Main Topics  . Smoking status: Never Smoker   . Smokeless tobacco: Never Used  . Alcohol Use: Yes     Comment: 1 or 2 per week  . Drug Use: No  . Sexually Active: Yes -- Female partner(s)   Other Topics Concern  . Not on file   Social History Narrative   Exercise-- no   Health Maintenance  Topic Date Due  . Influenza Vaccine  09/07/2012  . Pap Smear  05/30/2013  . Mammogram  06/10/2013  . Tetanus/tdap  02/02/2018  . Colonoscopy  09/08/2019    The following portions of the patient's history were reviewed and updated as appropriate:  She  has a past medical history of Cancer. She  does not have any pertinent problems on file. She  has past surgical history that includes Hernia repair (1995); Hernia repair (1996); Cholecystectomy (05/2008); uterine polyp removal (06/2008); Colonoscopy (08/2009); port a cath insertion (06/2008); and Breast surgery (05/2008). Her family history includes Heart disease in her father. She  reports that she has never smoked. She has never used smokeless tobacco. She reports that she drinks alcohol. She reports that she does not use illicit drugs. She has a current medication list which includes the following prescription(s): b complex vitamins, cholecalciferol, diphenhydramine, duloxetine, gabapentin, multivitamin, and valacyclovir. Current Outpatient Prescriptions on File Prior to Visit  Medication Sig Dispense Refill  . cholecalciferol (VITAMIN D) 1000 UNITS tablet Take 2,000 Units by mouth daily.      . diphenhydrAMINE (BENADRYL) 25 MG tablet Take 25 mg by mouth at bedtime as needed.      . DULoxetine (CYMBALTA) 30 MG  capsule Take 1 capsule (30 mg total) by mouth daily.  90 capsule  12  . gabapentin (NEURONTIN) 300 MG capsule Take 3 capsules (900 mg total) by mouth 4 (four) times daily.  360 capsule  12  . Multiple Vitamin (MULTIVITAMIN) tablet Take 1 tablet by mouth daily.      . valACYclovir (VALTREX) 500 MG tablet Take 500 mg by mouth daily.        She is allergic to clarithromycin and penicillins..  Review of Systems Review of Systems  Constitutional: Negative for activity change, appetite change and fatigue.  HENT: Negative for hearing loss, congestion, tinnitus and ear discharge.  dentist q68m Eyes: Negative for visual disturbance (see optho q1y -- vision corrected to 20/20 with glasses).  Respiratory: Negative for cough, chest tightness and shortness of breath.   Cardiovascular: Negative for chest pain, palpitations and leg swelling.  Gastrointestinal: Negative for abdominal pain, diarrhea, constipation and abdominal distention.  Genitourinary: Negative for urgency, frequency, decreased urine volume and difficulty urinating.  Musculoskeletal: Negative for back pain, arthralgias and gait problem.  Skin: Negative for color change, pallor and rash.  Neurological: Negative for dizziness, light-headedness, numbness and headaches.  Hematological: Negative for adenopathy. Does not bruise/bleed easily.  Psychiatric/Behavioral: Negative for suicidal ideas, confusion, sleep disturbance, self-injury, dysphoric mood, decreased concentration and agitation.       Objective:    BP 120/70  Pulse 65  Temp 97.9 F (36.6  C) (Oral)  Ht 5' 7.25" (1.708 m)  Wt 194 lb 12.8 oz (88.361 kg)  BMI 30.28 kg/m2  SpO2 97% General appearance: alert, cooperative, appears stated age and no distress Head: Normocephalic, without obvious abnormality, atraumatic Eyes: conjunctivae/corneas clear. PERRL, EOM's intact. Fundi benign. Ears: normal TM's and external ear canals both ears Nose: Nares normal. Septum midline.  Mucosa normal. No drainage or sinus tenderness. Throat: lips, mucosa, and tongue normal; teeth and gums normal Neck: no adenopathy, no carotid bruit, no JVD, supple, symmetrical, trachea midline and thyroid not enlarged, symmetric, no tenderness/mass/nodules Back: symmetric, no curvature. ROM normal. No CVA tenderness. Lungs: clear to auscultation bilaterally Breasts: gyn Heart: regular rate and rhythm, S1, S2 normal, no murmur, click, rub or gallop Abdomen: soft, non-tender; bowel sounds normal; no masses,  no organomegaly Pelvic: deferred--gyn Extremities: extremities normal, atraumatic, no cyanosis or edema Pulses: 2+ and symmetric Skin: Skin color, texture, turgor normal. No rashes or lesions Lymph nodes: Cervical, supraclavicular, and axillary nodes normal. Neurologic: Alert and oriented X 3, normal strength and tone. Normal symmetric reflexes. Normal coordination and gait psych--     Assessment:    Healthy female exam.      Plan:    ghm utd Check labs See After Visit Summary for Counseling Recommendations

## 2011-12-18 NOTE — Patient Instructions (Signed)
Preventive Care for Adults, Female A healthy lifestyle and preventive care can promote health and wellness. Preventive health guidelines for women include the following key practices.  A routine yearly physical is a good way to check with your caregiver about your health and preventive screening. It is a chance to share any concerns and updates on your health, and to receive a thorough exam.  Visit your dentist for a routine exam and preventive care every 6 months. Brush your teeth twice a day and floss once a day. Good oral hygiene prevents tooth decay and gum disease.  The frequency of eye exams is based on your age, health, family medical history, use of contact lenses, and other factors. Follow your caregiver's recommendations for frequency of eye exams.  Eat a healthy diet. Foods like vegetables, fruits, whole grains, low-fat dairy products, and lean protein foods contain the nutrients you need without too many calories. Decrease your intake of foods high in solid fats, added sugars, and salt. Eat the right amount of calories for you.Get information about a proper diet from your caregiver, if necessary.  Regular physical exercise is one of the most important things you can do for your health. Most adults should get at least 150 minutes of moderate-intensity exercise (any activity that increases your heart rate and causes you to sweat) each week. In addition, most adults need muscle-strengthening exercises on 2 or more days a week.  Maintain a healthy weight. The body mass index (BMI) is a screening tool to identify possible weight problems. It provides an estimate of body fat based on height and weight. Your caregiver can help determine your BMI, and can help you achieve or maintain a healthy weight.For adults 20 years and older:  A BMI below 18.5 is considered underweight.  A BMI of 18.5 to 24.9 is normal.  A BMI of 25 to 29.9 is considered overweight.  A BMI of 30 and above is  considered obese.  Maintain normal blood lipids and cholesterol levels by exercising and minimizing your intake of saturated fat. Eat a balanced diet with plenty of fruit and vegetables. Blood tests for lipids and cholesterol should begin at age 20 and be repeated every 5 years. If your lipid or cholesterol levels are high, you are over 50, or you are at high risk for heart disease, you may need your cholesterol levels checked more frequently.Ongoing high lipid and cholesterol levels should be treated with medicines if diet and exercise are not effective.  If you smoke, find out from your caregiver how to quit. If you do not use tobacco, do not start.  If you are pregnant, do not drink alcohol. If you are breastfeeding, be very cautious about drinking alcohol. If you are not pregnant and choose to drink alcohol, do not exceed 1 drink per day. One drink is considered to be 12 ounces (355 mL) of beer, 5 ounces (148 mL) of wine, or 1.5 ounces (44 mL) of liquor.  Avoid use of street drugs. Do not share needles with anyone. Ask for help if you need support or instructions about stopping the use of drugs.  High blood pressure causes heart disease and increases the risk of stroke. Your blood pressure should be checked at least every 1 to 2 years. Ongoing high blood pressure should be treated with medicines if weight loss and exercise are not effective.  If you are 55 to 50 years old, ask your caregiver if you should take aspirin to prevent strokes.  Diabetes   screening involves taking a blood sample to check your fasting blood sugar level. This should be done once every 3 years, after age 45, if you are within normal weight and without risk factors for diabetes. Testing should be considered at a younger age or be carried out more frequently if you are overweight and have at least 1 risk factor for diabetes.  Breast cancer screening is essential preventive care for women. You should practice "breast  self-awareness." This means understanding the normal appearance and feel of your breasts and may include breast self-examination. Any changes detected, no matter how small, should be reported to a caregiver. Women in their 20s and 30s should have a clinical breast exam (CBE) by a caregiver as part of a regular health exam every 1 to 3 years. After age 40, women should have a CBE every year. Starting at age 40, women should consider having a mammography (breast X-ray test) every year. Women who have a family history of breast cancer should talk to their caregiver about genetic screening. Women at a high risk of breast cancer should talk to their caregivers about having magnetic resonance imaging (MRI) and a mammography every year.  The Pap test is a screening test for cervical cancer. A Pap test can show cell changes on the cervix that might become cervical cancer if left untreated. A Pap test is a procedure in which cells are obtained and examined from the lower end of the uterus (cervix).  Women should have a Pap test starting at age 21.  Between ages 21 and 29, Pap tests should be repeated every 2 years.  Beginning at age 30, you should have a Pap test every 3 years as long as the past 3 Pap tests have been normal.  Some women have medical problems that increase the chance of getting cervical cancer. Talk to your caregiver about these problems. It is especially important to talk to your caregiver if a new problem develops soon after your last Pap test. In these cases, your caregiver may recommend more frequent screening and Pap tests.  The above recommendations are the same for women who have or have not gotten the vaccine for human papillomavirus (HPV).  If you had a hysterectomy for a problem that was not cancer or a condition that could lead to cancer, then you no longer need Pap tests. Even if you no longer need a Pap test, a regular exam is a good idea to make sure no other problems are  starting.  If you are between ages 65 and 70, and you have had normal Pap tests going back 10 years, you no longer need Pap tests. Even if you no longer need a Pap test, a regular exam is a good idea to make sure no other problems are starting.  If you have had past treatment for cervical cancer or a condition that could lead to cancer, you need Pap tests and screening for cancer for at least 20 years after your treatment.  If Pap tests have been discontinued, risk factors (such as a new sexual partner) need to be reassessed to determine if screening should be resumed.  The HPV test is an additional test that may be used for cervical cancer screening. The HPV test looks for the virus that can cause the cell changes on the cervix. The cells collected during the Pap test can be tested for HPV. The HPV test could be used to screen women aged 30 years and older, and should   be used in women of any age who have unclear Pap test results. After the age of 30, women should have HPV testing at the same frequency as a Pap test.  Colorectal cancer can be detected and often prevented. Most routine colorectal cancer screening begins at the age of 50 and continues through age 75. However, your caregiver may recommend screening at an earlier age if you have risk factors for colon cancer. On a yearly basis, your caregiver may provide home test kits to check for hidden blood in the stool. Use of a small camera at the end of a tube, to directly examine the colon (sigmoidoscopy or colonoscopy), can detect the earliest forms of colorectal cancer. Talk to your caregiver about this at age 50, when routine screening begins. Direct examination of the colon should be repeated every 5 to 10 years through age 75, unless early forms of pre-cancerous polyps or small growths are found.  Hepatitis C blood testing is recommended for all people born from 1945 through 1965 and any individual with known risks for hepatitis C.  Practice  safe sex. Use condoms and avoid high-risk sexual practices to reduce the spread of sexually transmitted infections (STIs). STIs include gonorrhea, chlamydia, syphilis, trichomonas, herpes, HPV, and human immunodeficiency virus (HIV). Herpes, HIV, and HPV are viral illnesses that have no cure. They can result in disability, cancer, and death. Sexually active women aged 25 and younger should be checked for chlamydia. Older women with new or multiple partners should also be tested for chlamydia. Testing for other STIs is recommended if you are sexually active and at increased risk.  Osteoporosis is a disease in which the bones lose minerals and strength with aging. This can result in serious bone fractures. The risk of osteoporosis can be identified using a bone density scan. Women ages 65 and over and women at risk for fractures or osteoporosis should discuss screening with their caregivers. Ask your caregiver whether you should take a calcium supplement or vitamin D to reduce the rate of osteoporosis.  Menopause can be associated with physical symptoms and risks. Hormone replacement therapy is available to decrease symptoms and risks. You should talk to your caregiver about whether hormone replacement therapy is right for you.  Use sunscreen with sun protection factor (SPF) of 30 or more. Apply sunscreen liberally and repeatedly throughout the day. You should seek shade when your shadow is shorter than you. Protect yourself by wearing long sleeves, pants, a wide-brimmed hat, and sunglasses year round, whenever you are outdoors.  Once a month, do a whole body skin exam, using a mirror to look at the skin on your back. Notify your caregiver of new moles, moles that have irregular borders, moles that are larger than a pencil eraser, or moles that have changed in shape or color.  Stay current with required immunizations.  Influenza. You need a dose every fall (or winter). The composition of the flu vaccine  changes each year, so being vaccinated once is not enough.  Pneumococcal polysaccharide. You need 1 to 2 doses if you smoke cigarettes or if you have certain chronic medical conditions. You need 1 dose at age 65 (or older) if you have never been vaccinated.  Tetanus, diphtheria, pertussis (Tdap, Td). Get 1 dose of Tdap vaccine if you are younger than age 65, are over 65 and have contact with an infant, are a healthcare worker, are pregnant, or simply want to be protected from whooping cough. After that, you need a Td   booster dose every 10 years. Consult your caregiver if you have not had at least 3 tetanus and diphtheria-containing shots sometime in your life or have a deep or dirty wound.  HPV. You need this vaccine if you are a woman age 26 or younger. The vaccine is given in 3 doses over 6 months.  Measles, mumps, rubella (MMR). You need at least 1 dose of MMR if you were born in 1957 or later. You may also need a second dose.  Meningococcal. If you are age 19 to 21 and a first-year college student living in a residence hall, or have one of several medical conditions, you need to get vaccinated against meningococcal disease. You may also need additional booster doses.  Zoster (shingles). If you are age 60 or older, you should get this vaccine.  Varicella (chickenpox). If you have never had chickenpox or you were vaccinated but received only 1 dose, talk to your caregiver to find out if you need this vaccine.  Hepatitis A. You need this vaccine if you have a specific risk factor for hepatitis A virus infection or you simply wish to be protected from this disease. The vaccine is usually given as 2 doses, 6 to 18 months apart.  Hepatitis B. You need this vaccine if you have a specific risk factor for hepatitis B virus infection or you simply wish to be protected from this disease. The vaccine is given in 3 doses, usually over 6 months. Preventive Services / Frequency Ages 19 to 39  Blood  pressure check.** / Every 1 to 2 years.  Lipid and cholesterol check.** / Every 5 years beginning at age 20.  Clinical breast exam.** / Every 3 years for women in their 20s and 30s.  Pap test.** / Every 2 years from ages 21 through 29. Every 3 years starting at age 30 through age 65 or 70 with a history of 3 consecutive normal Pap tests.  HPV screening.** / Every 3 years from ages 30 through ages 65 to 70 with a history of 3 consecutive normal Pap tests.  Hepatitis C blood test.** / For any individual with known risks for hepatitis C.  Skin self-exam. / Monthly.  Influenza immunization.** / Every year.  Pneumococcal polysaccharide immunization.** / 1 to 2 doses if you smoke cigarettes or if you have certain chronic medical conditions.  Tetanus, diphtheria, pertussis (Tdap, Td) immunization. / A one-time dose of Tdap vaccine. After that, you need a Td booster dose every 10 years.  HPV immunization. / 3 doses over 6 months, if you are 26 and younger.  Measles, mumps, rubella (MMR) immunization. / You need at least 1 dose of MMR if you were born in 1957 or later. You may also need a second dose.  Meningococcal immunization. / 1 dose if you are age 19 to 21 and a first-year college student living in a residence hall, or have one of several medical conditions, you need to get vaccinated against meningococcal disease. You may also need additional booster doses.  Varicella immunization.** / Consult your caregiver.  Hepatitis A immunization.** / Consult your caregiver. 2 doses, 6 to 18 months apart.  Hepatitis B immunization.** / Consult your caregiver. 3 doses usually over 6 months. Ages 40 to 64  Blood pressure check.** / Every 1 to 2 years.  Lipid and cholesterol check.** / Every 5 years beginning at age 20.  Clinical breast exam.** / Every year after age 40.  Mammogram.** / Every year beginning at age 40   and continuing for as long as you are in good health. Consult with your  caregiver.  Pap test.** / Every 3 years starting at age 30 through age 65 or 70 with a history of 3 consecutive normal Pap tests.  HPV screening.** / Every 3 years from ages 30 through ages 65 to 70 with a history of 3 consecutive normal Pap tests.  Fecal occult blood test (FOBT) of stool. / Every year beginning at age 50 and continuing until age 75. You may not need to do this test if you get a colonoscopy every 10 years.  Flexible sigmoidoscopy or colonoscopy.** / Every 5 years for a flexible sigmoidoscopy or every 10 years for a colonoscopy beginning at age 50 and continuing until age 75.  Hepatitis C blood test.** / For all people born from 1945 through 1965 and any individual with known risks for hepatitis C.  Skin self-exam. / Monthly.  Influenza immunization.** / Every year.  Pneumococcal polysaccharide immunization.** / 1 to 2 doses if you smoke cigarettes or if you have certain chronic medical conditions.  Tetanus, diphtheria, pertussis (Tdap, Td) immunization.** / A one-time dose of Tdap vaccine. After that, you need a Td booster dose every 10 years.  Measles, mumps, rubella (MMR) immunization. / You need at least 1 dose of MMR if you were born in 1957 or later. You may also need a second dose.  Varicella immunization.** / Consult your caregiver.  Meningococcal immunization.** / Consult your caregiver.  Hepatitis A immunization.** / Consult your caregiver. 2 doses, 6 to 18 months apart.  Hepatitis B immunization.** / Consult your caregiver. 3 doses, usually over 6 months. Ages 65 and over  Blood pressure check.** / Every 1 to 2 years.  Lipid and cholesterol check.** / Every 5 years beginning at age 20.  Clinical breast exam.** / Every year after age 40.  Mammogram.** / Every year beginning at age 40 and continuing for as long as you are in good health. Consult with your caregiver.  Pap test.** / Every 3 years starting at age 30 through age 65 or 70 with a 3  consecutive normal Pap tests. Testing can be stopped between 65 and 70 with 3 consecutive normal Pap tests and no abnormal Pap or HPV tests in the past 10 years.  HPV screening.** / Every 3 years from ages 30 through ages 65 or 70 with a history of 3 consecutive normal Pap tests. Testing can be stopped between 65 and 70 with 3 consecutive normal Pap tests and no abnormal Pap or HPV tests in the past 10 years.  Fecal occult blood test (FOBT) of stool. / Every year beginning at age 50 and continuing until age 75. You may not need to do this test if you get a colonoscopy every 10 years.  Flexible sigmoidoscopy or colonoscopy.** / Every 5 years for a flexible sigmoidoscopy or every 10 years for a colonoscopy beginning at age 50 and continuing until age 75.  Hepatitis C blood test.** / For all people born from 1945 through 1965 and any individual with known risks for hepatitis C.  Osteoporosis screening.** / A one-time screening for women ages 65 and over and women at risk for fractures or osteoporosis.  Skin self-exam. / Monthly.  Influenza immunization.** / Every year.  Pneumococcal polysaccharide immunization.** / 1 dose at age 65 (or older) if you have never been vaccinated.  Tetanus, diphtheria, pertussis (Tdap, Td) immunization. / A one-time dose of Tdap vaccine if you are over   65 and have contact with an infant, are a healthcare worker, or simply want to be protected from whooping cough. After that, you need a Td booster dose every 10 years.  Varicella immunization.** / Consult your caregiver.  Meningococcal immunization.** / Consult your caregiver.  Hepatitis A immunization.** / Consult your caregiver. 2 doses, 6 to 18 months apart.  Hepatitis B immunization.** / Check with your caregiver. 3 doses, usually over 6 months. ** Family history and personal history of risk and conditions may change your caregiver's recommendations. Document Released: 02/19/2001 Document Revised: 03/18/2011  Document Reviewed: 05/21/2010 [employer] Patient Information 2013 [employer].  

## 2011-12-18 NOTE — Assessment & Plan Note (Signed)
Per oncology °

## 2012-03-26 ENCOUNTER — Other Ambulatory Visit (HOSPITAL_BASED_OUTPATIENT_CLINIC_OR_DEPARTMENT_OTHER): Payer: Managed Care, Other (non HMO) | Admitting: Lab

## 2012-03-26 ENCOUNTER — Other Ambulatory Visit: Payer: Self-pay | Admitting: *Deleted

## 2012-03-26 DIAGNOSIS — D696 Thrombocytopenia, unspecified: Secondary | ICD-10-CM

## 2012-03-26 DIAGNOSIS — C50919 Malignant neoplasm of unspecified site of unspecified female breast: Secondary | ICD-10-CM

## 2012-03-26 LAB — CBC WITH DIFFERENTIAL/PLATELET
BASO%: 0.5 % (ref 0.0–2.0)
Basophils Absolute: 0 10*3/uL (ref 0.0–0.1)
HCT: 39.3 % (ref 34.8–46.6)
HGB: 13.2 g/dL (ref 11.6–15.9)
MCH: 32.5 pg (ref 25.1–34.0)
MONO%: 7.8 % (ref 0.0–14.0)
NEUT%: 68.3 % (ref 38.4–76.8)
Platelets: 244 10*3/uL (ref 145–400)
lymph#: 1.5 10*3/uL (ref 0.9–3.3)

## 2012-03-26 LAB — COMPREHENSIVE METABOLIC PANEL (CC13)
ALT: 35 U/L (ref 0–55)
AST: 26 U/L (ref 5–34)
CO2: 29 mEq/L (ref 22–29)
Creatinine: 0.9 mg/dL (ref 0.6–1.1)
Sodium: 141 mEq/L (ref 136–145)
Total Bilirubin: 0.3 mg/dL (ref 0.20–1.20)
Total Protein: 7.2 g/dL (ref 6.4–8.3)

## 2012-03-26 LAB — CANCER ANTIGEN 27.29: CA 27.29: 29 U/mL (ref 0–39)

## 2012-04-02 ENCOUNTER — Telehealth: Payer: Self-pay | Admitting: *Deleted

## 2012-04-02 ENCOUNTER — Ambulatory Visit (HOSPITAL_BASED_OUTPATIENT_CLINIC_OR_DEPARTMENT_OTHER): Payer: Managed Care, Other (non HMO) | Admitting: Physician Assistant

## 2012-04-02 ENCOUNTER — Encounter: Payer: Self-pay | Admitting: Physician Assistant

## 2012-04-02 VITALS — BP 129/85 | HR 69 | Temp 97.8°F | Resp 20 | Ht 67.0 in | Wt 219.7 lb

## 2012-04-02 DIAGNOSIS — Z853 Personal history of malignant neoplasm of breast: Secondary | ICD-10-CM

## 2012-04-02 DIAGNOSIS — C50919 Malignant neoplasm of unspecified site of unspecified female breast: Secondary | ICD-10-CM

## 2012-04-02 DIAGNOSIS — Z171 Estrogen receptor negative status [ER-]: Secondary | ICD-10-CM

## 2012-04-02 DIAGNOSIS — C50119 Malignant neoplasm of central portion of unspecified female breast: Secondary | ICD-10-CM

## 2012-04-02 NOTE — Telephone Encounter (Signed)
appts made and printed 

## 2012-04-02 NOTE — Progress Notes (Signed)
ID: Jeanette Boone   DOB: 1960/09/02  MR#: [id number]  WUJ#:[id number]  PCP: Loreen Freud MD GYN: Genia Del MD SU: Claud Kelp MD OTHER MD: Avie Echevaria, Antony Blackbird, Elmon Else  HISTORY OF PRESENT ILLNESS: She herself palpated a mass in her right breast in 04/2008.  The patient tells me that she had lost approximately 100 pounds by diet and exercise over the past year, and that this made her breasts lumpier feeling.  In any case, she brought this question to Dr. Awilda Metro Lavoie's attention, and was set up for mammography at [facility] on 04/21/2008.  She had had a mammogram in 09/2007, which had been unremarkable.  The mammogram on 05/01/2008 showed a dense right breast with a suggestion of a possible mass in the upper central breast corresponding to the palpable abnormality.  Ultrasound was more revealing, and did show an irregularly bordered hypoechoic solid mass measuring 1.2 cm.  The patient was brought back for biopsy and clip placement on 04/23, and the pathology from that procedure ([id number] and [id number]) showed an invasive ductal carcinoma which was negative for the estrogen and progesterone receptor at 0 and 0 respectively.  The Ki-67 was 19%, and CISH was amplified with a ratio of 2.22 (this is just above the "indeterminate" range).  Bilateral breast MRIs were obtained on 04/30.  This showed in the right breast an enhancing rounded mass measuring up to 1.8 cm.  The left breast was unremarkable, and there were no enlarged or suspicious internal mammary or axillary nodes on either side.  With this information, the patient proceeded to definitive right lumpectomy and sentinel lymph node sampling on 05/16/2008.  The final pathology ([id number]) confirmed a 1.5 cm invasive ductal carcinoma, grade 2, with negative margins, no evidence of lymphovascular invasion, and 0 of 3 sentinel lymph nodes involved.  The estrogen receptor test was repeated ([id number]), and was again negative with a  strong positive control. Her subsequent history is as detailed below.   INTERVAL HISTORY:  Jeanette Boone returns for followup of her right breast cancer. The interval history is unremarkable. 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 These are very busy at home with an 36-year-old son and 31 year old daughter.   Jeanette Boone continues to have chronic neurologic issues, with sensations of pins and needles and overall burning. These are stable and well controlled currently with gabapentin, 3600 mg daily. She was previously followed by Dr. Sandria Manly.  REVIEW OF SYSTEMS:  Jeanette Boone has had no recent illnesses and denies fevers or chills. She has no significant hot flashes. She still has some occasional spotting, but her periods are irregular. She denies any rashes, skin changes, or abnormal bleeding. She's eating and drinking well. In fact she tells me she is "eating too much" it has not been exercising regularly. She has some occasional diarrhea along with some occasional pain in the right upper quadrant of the abdomen. She is status post cholecystectomy in 2010. She's also status post screening colonoscopy in 2011 which was unremarkable. Jeanette Boone has had no nausea or emesis. She denies any abnormal headaches or dizziness and has had no unusual myalgias, arthralgias, or bony pain. She denies peripheral swelling.  A detailed review of systems was otherwise noncontributory.   PAST MEDICAL HISTORY: Past Medical History  Diagnosis Date  . Cancer     breast  1. Obesity.  As stated, the patient has been on a very strict 1200-1400 calorie diet and 5 days of exercise  for over a year, and tells me she has lost over 100 pounds.  She tells me she has done this in the past, and that she loses weight easily, but also gains weight easily.  She is now a bit below where she would like to be, her goal being approximately 140 pounds. 2. She had an episode of cholecystitis, and underwent  cholecystectomy on 06/01/2008 under Dr. Derrell Lolling.  The pathology there ([id number]) showed only chronic cholecystitis and cholelithiasis with a benign cystic duct lymph node. 3. The patient has a history of endometriosis. 4. Status post umbilical hernia repair. 5. The patient tried for many years to get pregnant and took "large doses of fertility drugs" for about 8 or 9 years before giving up on those attempts.       6.   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          7.  History of poorly understood neurologic symptom of  hyperesthesia, controlled on neurontin    PAST SURGICAL HISTORY: S/p cholecystectomy  FAMILY HISTORY The patient's father died at the age of 82 from causes not quite clear to the patient, but he certainly had significant heart disease.  The patient's mother is alive.  She has a history of bladder cancer.  Both the patient's parents were single children.  The patient has one sister and two brothers.  There is no history of breast cancer in the immediate family.  GYNECOLOGIC HISTORY: She is GX, P0. She was having regular periods at the time of her cancer diagnosis, these were interrupted by chemotherapy, then resumed. Her periods are now irregular. She is having mild hot flashes.  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  Her husband, [name], is an Art gallery manager.  Their two children, [name] and [name],  were adopted from [place].  The patient attends the [employer].    ADVANCED DIRECTIVES:  HEALTH MAINTENANCE: History  Substance Use Topics  . Smoking status: Never Smoker   . Smokeless tobacco: Never Used  . Alcohol Use: Yes     Comment: 1 or 2 per week     Colonoscopy: 2011  PAP: UTD  Bone density:  Lipid panel:  Allergies  Allergen Reactions  . Clarithromycin   . Penicillins     Current Outpatient Prescriptions  Medication Sig Dispense Refill  . B Complex Vitamins (B-COMPLEX/B-12 PO) Take by mouth.       . cholecalciferol (VITAMIN D) 1000 UNITS tablet Take 2,000 Units by mouth daily.      . diphenhydrAMINE (BENADRYL) 25 MG tablet Take 25 mg by mouth at bedtime as needed.      . DULoxetine (CYMBALTA) 30 MG capsule Take 1 capsule (30 mg total) by mouth daily.  90 capsule  12  . gabapentin (NEURONTIN) 300 MG capsule Take 3 capsules (900 mg total) by mouth 4 (four) times daily.  360 capsule  12  . Multiple Vitamin (MULTIVITAMIN) tablet Take 1 tablet by mouth daily.      . valACYclovir (VALTREX) 500 MG tablet Take 500 mg by mouth daily.        No current facility-administered medications for this visit.    OBJECTIVE: Middle-aged white woman who appears well Filed Vitals:   04/02/12 1234  BP: 129/85  Pulse: 69  Temp: 97.8 F (36.6 C)  Resp: 20     Body mass index is 34.4 kg/(m^2).    ECOG FS: 0 Filed Weights   04/02/12  1234  Weight: 219 lb 11.2 oz (99.655 kg)   Sclerae unicteric Oropharynx clear No peripheral adenopathy Lungs clear to auscultation bilaterally, no rales or rhonchi Heart regular rate and rhythm Abdomen is soft, with positive bowel sounds. There is mild tenderness to palpation in the right upper quadrant but no palpable masses or organomegaly noted. MSK no focal spinal tenderness, no peripheral edema Neuro: nonfocal, well oriented with positive affect Breasts: Right breast status post lumpectomy and radiation. There is no evidence of local recurrence. Left breast is unremarkable. Axillae are benign bilaterally with no palpable adenopathy.   LAB RESULTS: Lab Results  Component Value Date   WBC 6.8 03/26/2012   NEUTROABS 4.6 03/26/2012   HGB 13.2 03/26/2012   HCT 39.3 03/26/2012   MCV 96.4 03/26/2012   PLT 244 03/26/2012      Chemistry      Component Value Date/Time   NA 141 03/26/2012 1010   NA 140 12/18/2011 0935   K 4.4 03/26/2012 1010   K 4.3 12/18/2011 0935   CL 105 03/26/2012 1010   CL 103 12/18/2011 0935   CO2 29 03/26/2012 1010   CO2 32 12/18/2011 0935    BUN 12.2 03/26/2012 1010   BUN 13 12/18/2011 0935   CREATININE 0.9 03/26/2012 1010   CREATININE 0.9 12/18/2011 0935      Component Value Date/Time   CALCIUM 9.4 03/26/2012 1010   CALCIUM 9.1 12/18/2011 0935   ALKPHOS 107 03/26/2012 1010   ALKPHOS 83 12/18/2011 0935   AST 26 03/26/2012 1010   AST 24 12/18/2011 0935   ALT 35 03/26/2012 1010   ALT 19 12/18/2011 0935   BILITOT 0.30 03/26/2012 1010   BILITOT 0.6 12/18/2011 0935        STUDIES:  Most recent mammogram June 2013, unremarkable.   ASSESSMENT: 52 year old BRCA 1/2 negative [place] woman  (1) status post right lumpectomy and sentinel lymph node dissection May 2010 for a T1c N0, stage 1A invasive ductal carcinoma,  grade 2,  estrogen and progesterone receptor negative, but HER-2/neu positive.  The MIB-1 was 19%.   (2) received 4 cycles of docetaxel/carboplatin/trastuzumab adjuvantly  (3) she continued Herceptin to July 2010 with echocardiogram showing a well-preserved ejection fraction after the completion of treatment.  (4) she completed radiation in December 2010.   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 She'll have her next mammogram in June, and return to see [place] in September for repeat labs and physical exam.  I have recommended that Dois Davenport followup with either her primary care physician, or her gastroenterologist if the diarrhea continues, possibly for evaluation of irritable bowel.  She knows to call for any problems that may develop before the next visit.   Galadriel Shroff    04/02/2012

## 2012-04-17 ENCOUNTER — Telehealth: Payer: Self-pay

## 2012-04-17 DIAGNOSIS — C50919 Malignant neoplasm of unspecified site of unspecified female breast: Secondary | ICD-10-CM

## 2012-04-17 MED ORDER — GABAPENTIN 300 MG PO CAPS: 900.0000 mg | ORAL_CAPSULE | Freq: Four times a day (QID) | ORAL | Status: DC

## 2012-04-17 NOTE — Telephone Encounter (Signed)
Former [facility] patient requesting refills on Gabapentin.  Dr Eulah Citizen

## 2012-07-14 ENCOUNTER — Ambulatory Visit
Admission: RE | Admit: 2012-07-14 | Discharge: 2012-07-14 | Disposition: A | Discharge status: Home / Self Care | Payer: Managed Care, Other (non HMO) | Source: Ambulatory Visit | Attending: Physician Assistant | Admitting: Physician Assistant

## 2012-07-14 DIAGNOSIS — Z853 Personal history of malignant neoplasm of breast: Secondary | ICD-10-CM

## 2012-07-15 ENCOUNTER — Ambulatory Visit (INDEPENDENT_AMBULATORY_CARE_PROVIDER_SITE_OTHER): Payer: Managed Care, Other (non HMO) | Admitting: General Surgery

## 2012-07-15 ENCOUNTER — Telehealth: Payer: Self-pay | Admitting: Neurology

## 2012-07-15 ENCOUNTER — Encounter (INDEPENDENT_AMBULATORY_CARE_PROVIDER_SITE_OTHER): Payer: Self-pay | Admitting: General Surgery

## 2012-07-15 VITALS — BP 122/72 | HR 68 | Temp 97.4°F | Resp 16 | Ht 68.0 in | Wt 236.4 lb

## 2012-07-15 DIAGNOSIS — C50919 Malignant neoplasm of unspecified site of unspecified female breast: Secondary | ICD-10-CM

## 2012-07-15 NOTE — Progress Notes (Signed)
Patient ID: Jeanette Boone, female   DOB: 04-30-1960, 52 y.o.   MRN: [id number] History:  Jeanette Boone is a 52 y.o. female. She returns for long-term followup regarding her right breast cancer.  On 05/16/2008 she underwent right partial mastectomy and sentinel node biopsy. The tumor was pathologic stage T1c, ., N0, receptor-negative, HER-2 positive, invasive ductal carcinoma. I inserted a Port-A-Cath on 06/16/2008. She underwent a Herceptin-based chemotherapy. Port-A-Cath was removed on 09/09/2009. She has no evidence of recurrent disease to date.  She was concerned about thickening in her lumpectomy scar last year, but this is stable and Doesn't seem to concern her as much now.Marland Kitchen l.  Bilateral breast MRI was performed on  07/03/2010 and looked good. Bilateral diagnostic mammograms were performed this year, on 07/14/2012 these are category 2, no focal abnormality. She has very dense breasts and She is questioning whether to get another MRI. I told her that was reasonable, but she should be sure to get it precertified by her insurance company.  She sees Dr. Darnelle Catalan every 6 months.  We have discussed referral to plastic surgery for reduction mammoplasty to achieve symmetry and she has declined that again today.  ROS  she continues to follow with neurology regarding neurologic symptoms, felt to be autoimmune, probably related to her breast cancer. She is to see Melbourne Abts and is going to reestablish with Lesia Sago. She has gained weight and notices a little more asymmetry of her breasts as a result. She admits to anxiety because she had a friend that died of breast cancer.   Exam:   Constitutional: She is oriented. She appears well-developed and well-nourished. No distress.  Eyes: Conjunctivae and EOM are normal. Pupils are equal, round, and reactive to light.  Neck: Neck supple. No JVD present. No tracheal deviation present. No thyromegaly present.  Cardiovascular: Normal rate, regular rhythm, normal heart  sounds and intact distal pulses.  No murmur heard.  Pulmonary/Chest: Effort normal and breath sounds normal. No respiratory distress. She has no wheezes. She has no rales. She exhibits no tenderness.  Right breast is somewhat smaller than left breast. Both are moderately large and ptotic. Well-healed scar right breast upper inner quadrant and right axillary area. No palpable mass in either breast. No other skin change. Good range of motion right shoulder. No arm swelling.  Musculoskeletal: She exhibits no edema and no tenderness.  Lymphadenopathy:  She has no cervical adenopathy.  Neurological: She is alert and oriented to person, place, and time. She exhibits normal muscle tone. Coordination normal.  Skin: Skin is warm. No rash noted. She is not diaphoretic. No erythema. No pallor.  Psychiatric: She has a normal mood and affect. Her behavior is normal. Judgment and thought content normal  Assessment: Invasive ductal carcinoma right breast, pathologic stage TI C., N0, receptor-negative, HER-2 positive. No evidence of recurrent disease for years following right partial mastectomy, sentinel node biopsy, radiation therapy and Herceptin-based chemotherapy increased breast density  Plan: She was reassured that there was no evidence of local disease. Bilateral breast MRI is reasonable but without clear benefit. She is going to discuss this with Dr. Darnelle Catalan. Continue neurologic follow up with Dr. Neurology. Continue gabapentin Return to see me in one year after her annual mammograms.   Angelia Mould. Derrell Lolling, M.D., [facility] General and Minimally invasive Surgery Breast and Colorectal Surgery Office:   [phone] Pager:   [phone]

## 2012-07-15 NOTE — Patient Instructions (Signed)
Examination of your breasts and lymph  node areas today is normal. The 3-D mammograms performed yesterday are also normal, show no focal abnormality. There is no evidence of breast cancer  Keep your appointment Dr. Darnelle Catalan in September. Please discuss with him whether or not he he thinks an MRI would be of benefit.  If you  change your mind about the asymmetry in your breasts, let me know and we will be happy to refer you to a plastic surgeon who is skilled in this type of consultation and surgery.  Return to see Dr. Derrell Lolling in one year after you get your annual mammograms.

## 2012-07-16 NOTE — Telephone Encounter (Signed)
Called patient and spoke to her. Patient states she is doing fine. Patient states she does still have problems but nothing  That she needs to come in for right away. Patient is going in vacation July and Aug. Patient would like apt with Dr.Willis Maybe last week of August or in Sept. Patient requested Dr.Willis.

## 2012-08-25 ENCOUNTER — Telehealth: Payer: Self-pay | Admitting: Neurology

## 2012-08-25 ENCOUNTER — Telehealth: Payer: Self-pay | Admitting: *Deleted

## 2012-08-25 NOTE — Telephone Encounter (Signed)
I called and left a message for the patient to callback to the office to schedule an appt. 

## 2012-09-24 ENCOUNTER — Other Ambulatory Visit: Payer: Managed Care, Other (non HMO) | Admitting: Lab

## 2012-09-25 ENCOUNTER — Telehealth: Payer: Self-pay | Admitting: *Deleted

## 2012-09-25 ENCOUNTER — Other Ambulatory Visit: Payer: Managed Care, Other (non HMO)

## 2012-09-25 NOTE — Telephone Encounter (Signed)
Pt called to rs her missed appt from today to 9/22 @ 8:15am....td

## 2012-09-25 NOTE — Telephone Encounter (Signed)
Pt called to cancel her lab appt for today...td

## 2012-09-28 ENCOUNTER — Encounter: Payer: Self-pay | Admitting: Neurology

## 2012-09-28 ENCOUNTER — Other Ambulatory Visit (HOSPITAL_BASED_OUTPATIENT_CLINIC_OR_DEPARTMENT_OTHER): Payer: Managed Care, Other (non HMO) | Admitting: Lab

## 2012-09-28 DIAGNOSIS — C50919 Malignant neoplasm of unspecified site of unspecified female breast: Secondary | ICD-10-CM

## 2012-09-28 DIAGNOSIS — C50219 Malignant neoplasm of upper-inner quadrant of unspecified female breast: Secondary | ICD-10-CM

## 2012-09-28 LAB — CBC WITH DIFFERENTIAL/PLATELET
BASO%: 0.4 % (ref 0.0–2.0)
EOS%: 3.4 % (ref 0.0–7.0)
HCT: 39.9 % (ref 34.8–46.6)
LYMPH%: 28.2 % (ref 14.0–49.7)
MCH: 31.7 pg (ref 25.1–34.0)
MCHC: 33.3 g/dL (ref 31.5–36.0)
MCV: 95 fL (ref 79.5–101.0)
MONO#: 0.4 10*3/uL (ref 0.1–0.9)
MONO%: 8 % (ref 0.0–14.0)
NEUT%: 60 % (ref 38.4–76.8)
Platelets: 249 10*3/uL (ref 145–400)
RBC: 4.2 10*6/uL (ref 3.70–5.45)
WBC: 5.5 10*3/uL (ref 3.9–10.3)

## 2012-09-28 LAB — COMPREHENSIVE METABOLIC PANEL (CC13)
ALT: 19 U/L (ref 0–55)
Alkaline Phosphatase: 127 U/L (ref 40–150)
CO2: 26 mEq/L (ref 22–29)
Creatinine: 0.9 mg/dL (ref 0.6–1.1)
Sodium: 141 mEq/L (ref 136–145)
Total Bilirubin: 0.31 mg/dL (ref 0.20–1.20)
Total Protein: 7 g/dL (ref 6.4–8.3)

## 2012-10-01 ENCOUNTER — Telehealth: Payer: Self-pay | Admitting: *Deleted

## 2012-10-01 ENCOUNTER — Ambulatory Visit (HOSPITAL_BASED_OUTPATIENT_CLINIC_OR_DEPARTMENT_OTHER): Payer: Managed Care, Other (non HMO) | Admitting: Oncology

## 2012-10-01 VITALS — BP 125/82 | HR 76 | Temp 97.6°F | Resp 18 | Ht 67.0 in | Wt 241.0 lb

## 2012-10-01 DIAGNOSIS — R209 Unspecified disturbances of skin sensation: Secondary | ICD-10-CM

## 2012-10-01 DIAGNOSIS — C50219 Malignant neoplasm of upper-inner quadrant of unspecified female breast: Secondary | ICD-10-CM

## 2012-10-01 DIAGNOSIS — Z171 Estrogen receptor negative status [ER-]: Secondary | ICD-10-CM

## 2012-10-01 DIAGNOSIS — C50211 Malignant neoplasm of upper-inner quadrant of right female breast: Secondary | ICD-10-CM | POA: Insufficient documentation

## 2012-10-01 NOTE — Telephone Encounter (Signed)
appts made and printed. i emailed Jeanette Boone making him aware to put the MRI order. Pt is aware that i emailed Jeanette Boone asking him to put the order in...td

## 2012-10-01 NOTE — Progress Notes (Signed)
ID: Jeanette Boone   DOB: 27-Aug-1960  MR#: [id number]  QMV#:[id number]  PCP: Loreen Freud MD GYN: Genia Del MD SU: Claud Kelp MD OTHER MD: Avie Echevaria, Antony Blackbird, Elmon Else  HISTORY OF PRESENT ILLNESS: She herself palpated a mass in her right breast in 04/2008.  The patient tells me that she had lost approximately 100 pounds by diet and exercise over the past year, and that this made her breasts lumpier feeling.  In any case, she brought this question to Dr. Awilda Metro Lavoie's attention, and was set up for mammography at [facility] on 04/21/2008.  She had had a mammogram in 09/2007, which had been unremarkable.  The mammogram on 05/01/2008 showed a dense right breast with a suggestion of a possible mass in the upper central breast corresponding to the palpable abnormality.  Ultrasound was more revealing, and did show an irregularly bordered hypoechoic solid mass measuring 1.2 cm.  The patient was brought back for biopsy and clip placement on 04/23, and the pathology from that procedure ([id number] and [id number]) showed an invasive ductal carcinoma which was negative for the estrogen and progesterone receptor at 0 and 0 respectively.  The Ki-67 was 19%, and CISH was amplified with a ratio of 2.22 (this is just above the "indeterminate" range).  Bilateral breast MRIs were obtained on 04/30.  This showed in the right breast an enhancing rounded mass measuring up to 1.8 cm.  The left breast was unremarkable, and there were no enlarged or suspicious internal mammary or axillary nodes on either side.  With this information, the patient proceeded to definitive right lumpectomy and sentinel lymph node sampling on 05/16/2008.  The final pathology ([id number]) confirmed a 1.5 cm invasive ductal carcinoma, grade 2, with negative margins, no evidence of lymphovascular invasion, and 0 of 3 sentinel lymph nodes involved.  The estrogen receptor test was repeated ([id number]), and was again negative with a  strong positive control. Her subsequent history is as detailed below.   INTERVAL HISTORY:  Jeanette Boone returns for followup of her right breast cancer. The interval history is unremarkable. 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 Her children are now 8 and 11" take it was busy".   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 If anything it may have worsened recently. It isn't just a matter of numbness. She feels sometimes like she has shingles over. She has an appointment with Dr. Anne Hahn later this month, taking over for Dr. Sandria Manly. The fact that the feeling of pins and needles is worse where his her, because it is never been clear whether the neuropathy problem might be paraneoplastic, and if so it's worsening might suggest recurrence. Otherwise a she continues on gabapentin, with moderate results. She is having mild hot flashes. She is concerned about weight gain. A detailed review of systems was otherwise noncontributory  PAST MEDICAL HISTORY: Past Medical History  Diagnosis Date  . Cancer     breast  . Chronic leg pain   . Paresthesias     Generalized  . Thrombocytopenia, secondary     secondary to valtrex  1. Obesity.  As stated, the patient has been on a very strict 1200-1400 calorie diet and 5 days of exercise for over a year, and tells me she has lost over 100 pounds.  She tells me she has done this in the past, and that she loses weight easily, but also gains weight easily.  She is now  a bit below where she would like to be, her goal being approximately 140 pounds. 2. She had an episode of cholecystitis, and underwent cholecystectomy on 06/01/2008 under Dr. Derrell Lolling.  The pathology there ([id number]) showed only chronic cholecystitis and cholelithiasis with a benign cystic duct lymph node. 3. The patient has a history of endometriosis. 4. Status post umbilical hernia repair. 5. The patient tried for many years to get pregnant and took  "large doses of fertility drugs" for about 8 or 9 years before giving up on those attempts.       6.   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          7.  History of poorly understood neurologic symptom of  hyperesthesia, controlled on neurontin    PAST SURGICAL HISTORY: S/p cholecystectomy  FAMILY HISTORY The patient's father died at the age of 73 from causes not quite clear to the patient, but he certainly had significant heart disease.  The patient's mother is alive.  She has a history of bladder cancer.  Both the patient's parents were single children.  The patient has one sister and two brothers.  There is no history of breast cancer in the immediate family.  GYNECOLOGIC HISTORY: She is GX, P0. She was having regular periods at the time of her cancer diagnosis, these were interrupted by chemotherapy, then resumed. Her periods are now irregular. She is having mild hot flashes.  SOCIAL HISTORY: [name] works as a Field seismologist.  Her husband, [name], is an Art gallery manager.  Their two children, [name] and [name],  were adopted from [place].  The patient attends the [employer].    ADVANCED DIRECTIVES:  HEALTH MAINTENANCE: History  Substance Use Topics  . Smoking status: Former Games developer  . Smokeless tobacco: Never Used  . Alcohol Use: Yes     Comment: 1 or 2 per week     Colonoscopy: 2011  PAP: UTD  Bone density:  Lipid panel:  Allergies  Allergen Reactions  . Clarithromycin   . Penicillins     Current Outpatient Prescriptions  Medication Sig Dispense Refill  . B Complex Vitamins (B-COMPLEX/B-12 PO) Take by mouth.      . Biotin 1000 MCG tablet Take 1,000 mcg by mouth 3 (three) times daily.      . cholecalciferol (VITAMIN D) 1000 UNITS tablet Take 2,000 Units by mouth daily.      . DULoxetine (CYMBALTA) 30 MG capsule Take 1 capsule (30 mg total) by mouth daily.  90 capsule  12  . gabapentin (NEURONTIN) 300 MG capsule Take 3  capsules (900 mg total) by mouth 4 (four) times daily.  360 capsule  12  . Multiple Vitamin (MULTIVITAMIN) tablet Take 1 tablet by mouth daily.      . valACYclovir (VALTREX) 500 MG tablet Take 500 mg by mouth daily.        No current facility-administered medications for this visit.    OBJECTIVE: Middle-aged white woman in no acute distress Filed Vitals:   10/01/12 1155  BP: 125/82  Pulse: 76  Temp: 97.6 F (36.4 C)  Resp: 18     Body mass index is 37.74 kg/(m^2).    ECOG FS: 0 Filed Weights   10/01/12 1155  Weight: 241 lb (109.317 kg)   Sclerae unicteric Oropharynx clear No peripheral adenopathy Lungs clear bilaterally, no rales or rhonchi Heart regular rate and rhythm Abdomen soft, obese, nontender with positive bowel sounds.  MSK no focal  spinal tenderness, no peripheral edema Neuro: nonfocal, well oriented, positive affect Breasts: Right breast status post lumpectomy and radiation. There is no evidence of local recurrence. The right axilla is benign Left breast is unremarkable.   LAB RESULTS: Lab Results  Component Value Date   WBC 5.5 09/28/2012   NEUTROABS 3.3 09/28/2012   HGB 13.3 09/28/2012   HCT 39.9 09/28/2012   MCV 95.0 09/28/2012   PLT 249 09/28/2012      Chemistry      Component Value Date/Time   NA 141 09/28/2012 0805   NA 140 12/18/2011 0935   K 4.5 09/28/2012 0805   K 4.3 12/18/2011 0935   CL 105 03/26/2012 1010   CL 103 12/18/2011 0935   CO2 26 09/28/2012 0805   CO2 32 12/18/2011 0935   BUN 13.1 09/28/2012 0805   BUN 13 12/18/2011 0935   CREATININE 0.9 09/28/2012 0805   CREATININE 0.9 12/18/2011 0935      Component Value Date/Time   CALCIUM 9.5 09/28/2012 0805   CALCIUM 9.1 12/18/2011 0935   ALKPHOS 127 09/28/2012 0805   ALKPHOS 83 12/18/2011 0935   AST 19 09/28/2012 0805   AST 24 12/18/2011 0935   ALT 19 09/28/2012 0805   ALT 19 12/18/2011 0935   BILITOT 0.31 09/28/2012 0805   BILITOT 0.6 12/18/2011 0935        STUDIES: Mammography 07/14/2012  unremarkable  ASSESSMENT: 52 y.o.  BRCA negative [place] woman  (1) status post right lumpectomy and sentinel lymph node dissection May 2010 for a T1c N0, stage 1A invasive ductal carcinoma,  grade 2,  estrogen and progesterone receptor negative, but HER-2/neu positive.  The MIB-1 was 19%.   (2) received 4 cycles of docetaxel/carboplatin/trastuzumab adjuvantly  (3) she continued Herceptin to July 2010 with echocardiogram showing a well-preserved ejection fraction after the completion of treatment.  (4) she completed radiation in December 2010.   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 She is now 4 years out from her definitive surgery, with no evidence of disease recurrence. Given her unusual neuropathy, the plan is to follow her on a yearly basis for a total of 10 years. I would not repeat the mild increase in symptoms as related to possible disease progression, with no other specific symptoms and no exam or lab findings to suggest that. Instead I encouraged her to start again a regular exercise program and to consider changing her diet. Sometimes a limitation of gluten can have surprising effects.  In any case she will establish yourself with Dr. Anne Hahn as far as her neuropathy is concerned. She will see [place] again in one year. We will repeat a breast MRI before that visit as well as mammography. She knows to call for any problems that may develop before that.  Oshae Simmering C    10/04/2012

## 2012-10-01 NOTE — Telephone Encounter (Signed)
Lm gv appt for MRI for 10/09/12 @ 8pm...td

## 2012-10-02 ENCOUNTER — Other Ambulatory Visit: Payer: Self-pay | Admitting: Oncology

## 2012-10-09 ENCOUNTER — Other Ambulatory Visit: Payer: Managed Care, Other (non HMO)

## 2012-10-12 ENCOUNTER — Other Ambulatory Visit: Payer: Self-pay | Admitting: Oncology

## 2012-10-15 ENCOUNTER — Inpatient Hospital Stay: Admission: RE | Admit: 2012-10-15 | Payer: Managed Care, Other (non HMO) | Source: Ambulatory Visit

## 2012-10-22 ENCOUNTER — Other Ambulatory Visit: Payer: Managed Care, Other (non HMO)

## 2012-10-29 ENCOUNTER — Ambulatory Visit (INDEPENDENT_AMBULATORY_CARE_PROVIDER_SITE_OTHER): Payer: Managed Care, Other (non HMO) | Admitting: General Practice

## 2012-10-29 DIAGNOSIS — Z23 Encounter for immunization: Secondary | ICD-10-CM

## 2012-10-30 ENCOUNTER — Other Ambulatory Visit: Payer: Managed Care, Other (non HMO)

## 2012-11-02 ENCOUNTER — Encounter: Payer: Self-pay | Admitting: Neurology

## 2012-11-02 ENCOUNTER — Ambulatory Visit (INDEPENDENT_AMBULATORY_CARE_PROVIDER_SITE_OTHER): Payer: 59 | Admitting: Neurology

## 2012-11-02 VITALS — BP 124/78 | HR 62 | Wt 244.0 lb

## 2012-11-02 DIAGNOSIS — C50919 Malignant neoplasm of unspecified site of unspecified female breast: Secondary | ICD-10-CM

## 2012-11-02 DIAGNOSIS — G549 Nerve root and plexus disorder, unspecified: Secondary | ICD-10-CM

## 2012-11-02 HISTORY — DX: Nerve root and plexus disorder, unspecified: G54.9

## 2012-11-02 MED ORDER — DULOXETINE HCL 30 MG PO CPEP: 30.0000 mg | ORAL_CAPSULE | Freq: Every day | ORAL | Status: DC

## 2012-11-02 MED ORDER — GABAPENTIN 300 MG PO CAPS: 900.0000 mg | ORAL_CAPSULE | Freq: Four times a day (QID) | ORAL | Status: DC

## 2012-11-02 NOTE — Progress Notes (Signed)
Reason for visit: Numbness  Jeanette Boone is an 52 y.o. female  History of present illness:  Jeanette Boone is a 52 year old right-handed white female with a history of sensory alteration that began at age 32. The patient initially noted some problems with her toes of the foot and her shins. The patient eventually developed total body numbness including the head, body, arms, and legs. The patient indicates that the left side of her body is more affected than the right. The patient has tingling sensations and discomfort. The patient denies any weakness or balance issues. The patient has had a very thorough workup that included a nerve conduction study last done in 2010 that was unremarkable. The patient has had lumbar puncture, with normal spinal fluid analysis, and a full workup for a peripheral neuropathy was done, and this was unremarkable. The patient has been treated with high dose gabapentin, and she is on low-dose Cymbalta. The patient believes that her symptoms have worsened slightly over the years. The patient has had some areas of numbness on the left anterolateral thigh. The patient returns to this office for further evaluation. The patient has a history of breast cancer, and she had chemotherapy that included carboplatin and Taxotere. The patient indicates that during the chemotherapy, her symptoms worsened slightly.  Past Medical History  Diagnosis Date  . Cancer     breast  . Chronic leg pain   . Paresthesias     Generalized  . Thrombocytopenia, secondary     secondary to valtrex  . 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 Obesity   . Endometriosis     Past Surgical History  Procedure Laterality Date  . Hernia repair  1995    umbilical  . Hernia repair  1996  . Cholecystectomy  05/2008  . Uterine polyp removal  06/2008  . Colonoscopy  08/2009  . Port a cath insertion  06/2008  . Breast surgery  05/2008    lumpectomy - right  . Laparoscopy      Family History  Problem Relation Age  of Onset  . Heart disease Father     pacemaker----tachycardia  . Stroke Maternal Grandmother   . Heart disease Maternal Grandfather 50    MI  . Stroke Maternal Grandfather   . Heart disease Paternal Grandfather 25    MI  . Hypertension Mother     Social history:  reports that she has quit smoking. She has never used smokeless tobacco. She reports that she drinks alcohol. She reports that she does not use illicit drugs.    Allergies  Allergen Reactions  . Clarithromycin   . Penicillins     Medications:  Current Outpatient Prescriptions on File Prior to Visit  Medication Sig Dispense Refill  . B Complex Vitamins (B-COMPLEX/B-12 PO) Take by mouth.      . Biotin 1000 MCG tablet Take 1,000 mcg by mouth daily.       . cholecalciferol (VITAMIN D) 1000 UNITS tablet Take 2,000 Units by mouth daily.      . Multiple Vitamin (MULTIVITAMIN) tablet Take 1 tablet by mouth daily.      . valACYclovir (VALTREX) 500 MG tablet Take 500 mg by mouth daily.        No current facility-administered medications on file prior to visit.    ROS:  Out of a complete 14 system review of symptoms, the patient complains only of the following symptoms, and all other reviewed systems are negative.  Weight gain Hearing loss Joint pain,  achy muscles Numbness, weakness  Blood pressure 124/78, pulse 62, weight 244 lb (110.678 kg).  Physical Exam  General: The patient is alert and cooperative at the time of the examination. The patient is moderately obese.  Skin: No significant peripheral edema is noted.   Neurologic Exam  Mental status: The patient is oriented x 3.  Cranial nerves: Facial symmetry is present. Speech is normal, no aphasia or dysarthria is noted. Extraocular movements are full. Visual fields are full.  Motor: The patient has good strength in all 4 extremities.  Sensory examination: The patient appears to have some stocking pattern pinprick sensory deficit up to the knees  bilaterally. Vibration sensation is relatively intact throughout. Soft touch sensation is intact throughout, slightly decreased on the left leg as compared to the right.  Coordination: The patient has good finger-nose-finger and heel-to-shin bilaterally.  Gait and station: The patient has a normal gait. Tandem gait is normal. Romberg is negative. No drift is seen.  Reflexes: Deep tendon reflexes are symmetric, but the ankle jerk reflexes are slightly depressed bilaterally. Reflexes otherwise are normal..   Assessment/Plan:  One. Sensory alteration, all fours  The patient has no evidence of a peripheral neuropathy on nerve conduction studies done previously. The study will be repeated. The patient did receive chemotherapy that could result in a peripheral neuropathy, but she indicates that her symptoms began prior to the diagnosis of breast cancer. The patient has had an antibody panel for a paraneoplastic disorder, this was unremarkable. The patient does not wish to go up on medications at this point for her neuropathy-type pain. The patient will follow up through this office in 6-8 months. Nerve conduction studies will be done on all 4 extremities.  Marlan Palau MD 11/02/2012 8:05 PM  [facility] [address] [place], [place] [address]  Phone [phone] Fax [phone]

## 2012-11-12 ENCOUNTER — Telehealth: Payer: Self-pay | Admitting: Neurology

## 2012-11-12 ENCOUNTER — Ambulatory Visit (INDEPENDENT_AMBULATORY_CARE_PROVIDER_SITE_OTHER): Payer: 59

## 2012-11-12 DIAGNOSIS — G549 Nerve root and plexus disorder, unspecified: Secondary | ICD-10-CM

## 2012-11-12 NOTE — Procedures (Signed)
  HISTORY:  Jeanette Boone is a 52 year old patient with a six-year history of numbness involving all 4 extremities, left side worse than the right. The patient has a history of breast cancer associated with chemotherapy, but the sensory alteration started before the chemotherapy was begun. Prior nerve conduction studies done in 2010 were unremarkable. The patient is being reevaluated for the sensory changes.  NERVE CONDUCTION STUDIES:  Nerve conduction studies were performed on both upper extremities. The distal motor latencies and motor amplitudes for the median and ulnar nerves were within normal limits. The F wave latencies and nerve conduction velocities for these nerves were also normal. The sensory latencies for the median and ulnar nerves were normal.  Nerve conduction studies were performed on both lower extremities. The distal motor latencies and motor amplitudes for the peroneal and posterior tibial nerves were within normal limits. The nerve conduction velocities for these nerves were also normal. The H reflex latencies were normal. The sensory latencies for the peroneal nerves were within normal limits.   EMG STUDIES:  EMG evaluation was not performed.  IMPRESSION:  Nerve conduction studies done on all 4 extremities were within normal limits. No evidence of a peripheral neuropathy is seen. A small fiber neuropathy may be missed by a standard nerve conduction studies, however. Clinical correlation is required.  Marlan Palau MD 11/12/2012 11:53 AM  [facility] [address] [place], [place] [address]  Phone [phone] Fax [phone]

## 2012-11-12 NOTE — Telephone Encounter (Signed)
I called patient. The nerve conduction studies done on all 4 extremities were normal. The patient is on Cymbalta, if the discomfort level increases, she is to contact our office, and we will readjust medications.

## 2012-12-01 ENCOUNTER — Encounter: Payer: Managed Care, Other (non HMO) | Admitting: Family Medicine

## 2012-12-28 ENCOUNTER — Encounter: Payer: Self-pay | Admitting: Oncology

## 2012-12-28 NOTE — Progress Notes (Unsigned)
12/28/2012 I have spoken with this patient today, Jeanette Boone RE: BREAST MRI.  I have faxed on two separate occasions the clinicals, the radiology reports and results to [employer].  The insurance company per the patient needs for the MD to call and reference the information below:  Intake Number for Case#  [id number]  PHONE NUMBER# [phone] OPT. 4.  i HAVE LEFT THIS INFORMATION ON NURSE'S VOICEMAIL.  I have done all that I can do on this end in managed care.  They only want to tal;k with the ordering MD.  Bonita Quin

## 2013-02-26 ENCOUNTER — Telehealth: Payer: Self-pay | Admitting: *Deleted

## 2013-02-26 NOTE — Telephone Encounter (Signed)
Lm informed the pt that 05/31/13 is a holiday. gv appt for 06/02/13@ 1pm. Made the pt aware that i will mail a letter/avs...td

## 2013-03-17 ENCOUNTER — Other Ambulatory Visit: Payer: Self-pay | Admitting: Dermatology

## 2013-03-25 ENCOUNTER — Telehealth: Payer: Self-pay | Admitting: *Deleted

## 2013-03-25 NOTE — Telephone Encounter (Signed)
Called and confirmed appointment with patient and advised her to fast 8 hours prior to appointment with the exception of water and black coffee. JG//CMA

## 2013-03-26 ENCOUNTER — Encounter: Payer: Self-pay | Admitting: Family Medicine

## 2013-03-26 ENCOUNTER — Ambulatory Visit (INDEPENDENT_AMBULATORY_CARE_PROVIDER_SITE_OTHER): Payer: Managed Care, Other (non HMO) | Admitting: Family Medicine

## 2013-03-26 VITALS — BP 118/76 | HR 53 | Temp 97.7°F | Ht 67.5 in | Wt 219.0 lb

## 2013-03-26 DIAGNOSIS — Z2911 Encounter for prophylactic immunotherapy for respiratory syncytial virus (RSV): Secondary | ICD-10-CM

## 2013-03-26 DIAGNOSIS — Z23 Encounter for immunization: Secondary | ICD-10-CM

## 2013-03-26 DIAGNOSIS — Z0000 Encounter for general adult medical examination without abnormal findings: Secondary | ICD-10-CM

## 2013-03-26 DIAGNOSIS — E669 Obesity, unspecified: Secondary | ICD-10-CM | POA: Insufficient documentation

## 2013-03-26 DIAGNOSIS — Z8249 Family history of ischemic heart disease and other diseases of the circulatory system: Secondary | ICD-10-CM

## 2013-03-26 LAB — HEPATIC FUNCTION PANEL
ALK PHOS: 91 U/L (ref 39–117)
ALT: 19 U/L (ref 0–35)
AST: 19 U/L (ref 0–37)
Albumin: 4.1 g/dL (ref 3.5–5.2)
BILIRUBIN DIRECT: 0 mg/dL (ref 0.0–0.3)
TOTAL PROTEIN: 6.9 g/dL (ref 6.0–8.3)
Total Bilirubin: 0.5 mg/dL (ref 0.3–1.2)

## 2013-03-26 LAB — LIPID PANEL
CHOL/HDL RATIO: 3
CHOLESTEROL: 187 mg/dL (ref 0–200)
HDL: 61.5 mg/dL (ref >39.00)
LDL CALC: 111 mg/dL — AB (ref 0–99)
Triglycerides: 73 mg/dL (ref 0.0–149.0)
VLDL: 14.6 mg/dL (ref 0.0–40.0)

## 2013-03-26 LAB — CBC WITH DIFFERENTIAL/PLATELET
BASOS PCT: 0.4 % (ref 0.0–3.0)
Basophils Absolute: 0 10*3/uL (ref 0.0–0.1)
EOS PCT: 2.3 % (ref 0.0–5.0)
Eosinophils Absolute: 0.1 10*3/uL (ref 0.0–0.7)
HCT: 43.1 % (ref 36.0–46.0)
Hemoglobin: 14.1 g/dL (ref 12.0–15.0)
Lymphocytes Relative: 30.8 % (ref 12.0–46.0)
Lymphs Abs: 1.4 10*3/uL (ref 0.7–4.0)
MCHC: 32.6 g/dL (ref 30.0–36.0)
MCV: 97.4 fl (ref 78.0–100.0)
MONO ABS: 0.3 10*3/uL (ref 0.1–1.0)
MONOS PCT: 6.7 % (ref 3.0–12.0)
NEUTROS PCT: 59.8 % (ref 43.0–77.0)
Neutro Abs: 2.8 10*3/uL (ref 1.4–7.7)
Platelets: 190 10*3/uL (ref 150.0–400.0)
RBC: 4.42 Mil/uL (ref 3.87–5.11)
RDW: 13.6 % (ref 11.5–14.6)
WBC: 4.7 10*3/uL (ref 4.5–10.5)

## 2013-03-26 LAB — BASIC METABOLIC PANEL
BUN: 13 mg/dL (ref 6–23)
CHLORIDE: 104 meq/L (ref 96–112)
CO2: 30 mEq/L (ref 19–32)
CREATININE: 1 mg/dL (ref 0.4–1.2)
Calcium: 9.5 mg/dL (ref 8.4–10.5)
GFR: 64.89 mL/min (ref >60.00)
Glucose, Bld: 86 mg/dL (ref 70–99)
POTASSIUM: 4.2 meq/L (ref 3.5–5.1)
Sodium: 141 mEq/L (ref 135–145)

## 2013-03-26 LAB — POCT URINALYSIS DIPSTICK
Bilirubin, UA: NEGATIVE
Glucose, UA: NEGATIVE
Ketones, UA: NEGATIVE
Leukocytes, UA: NEGATIVE
Nitrite, UA: NEGATIVE
PH UA: 6
RBC UA: NEGATIVE
SPEC GRAV UA: 1.025
UROBILINOGEN UA: 0.2

## 2013-03-26 LAB — HEMOGLOBIN A1C: Hgb A1c MFr Bld: 5.6 % (ref 4.6–6.5)

## 2013-03-26 LAB — TSH: TSH: 1.68 u[IU]/mL (ref 0.35–5.50)

## 2013-03-26 NOTE — Progress Notes (Signed)
Pre visit review using our clinic review tool, if applicable. No additional management support is needed unless otherwise documented below in the visit note. 

## 2013-03-26 NOTE — Addendum Note (Signed)
Addended by: Rosalita Chessman on: 03/26/2013 02:17 PM   Modules accepted: Orders

## 2013-03-26 NOTE — Addendum Note (Signed)
Addended by: Rosalita Chessman on: 03/26/2013 02:19 PM   Modules accepted: Orders

## 2013-03-26 NOTE — Progress Notes (Signed)
Subjective:     Jeanette Boone is a 53 y.o. female and is here for a comprehensive physical exam. The patient reports no problems.  History   Social History  . Marital Status: Married    Spouse Name: N/A    Number of Children: 0  . Years of Education: N/A   Occupational History  .     Social History Main Topics  . Smoking status: Former Research scientist (life sciences)  . Smokeless tobacco: Never Used  . Alcohol Use: Yes     Comment: 1 or 2 per week  . Drug Use: No  . Sexual Activity: Yes    Partners: Male   Other Topics Concern  . Not on file   Social History Narrative   Exercise-- no   Health Maintenance  Topic Date Due  . Influenza Vaccine  08/07/2013  . Mammogram  07/15/2014  . Pap Smear  11/12/2015  . Tetanus/tdap  02/02/2018  . Colonoscopy  09/08/2019    The following portions of the patient's history were reviewed and updated as appropriate: allergies, current medications, past family history, past medical history, past social history, past surgical history and problem list.  Review of Systems Review of Systems  Constitutional: Negative for activity change, appetite change and fatigue.  HENT: Negative for hearing loss, congestion, tinnitus and ear discharge.  dentist q85m Eyes: Negative for visual disturbance (see optho q1y -- vision corrected to 20/20 with glasses).  Respiratory: Negative for cough, chest tightness and shortness of breath.   Cardiovascular: Negative for chest pain, palpitations and leg swelling.  Gastrointestinal: Negative for abdominal pain, diarrhea, constipation and abdominal distention.  Genitourinary: Negative for urgency, frequency, decreased urine volume and difficulty urinating.  Musculoskeletal: Negative for back pain, arthralgias and gait problem.  Skin: Negative for color change, pallor and rash.  Neurological: Negative for dizziness, light-headedness, numbness and headaches.  Hematological: Negative for adenopathy. Does not bruise/bleed easily.   Psychiatric/Behavioral: Negative for suicidal ideas, confusion, sleep disturbance, self-injury, dysphoric mood, decreased concentration and agitation.       Objective:    BP 118/76  Pulse 53  Temp(Src) 97.7 F (36.5 C) (Oral)  Ht 5' 7.5" (1.715 m)  Wt 219 lb (99.338 kg)  BMI 33.77 kg/m2  SpO2 99% General appearance: alert, cooperative, appears stated age and no distress Head: Normocephalic, without obvious abnormality, atraumatic Eyes: conjunctivae/corneas clear. PERRL, EOM's intact. Fundi benign. Ears: normal TM's and external ear canals both ears Nose: Nares normal. Septum midline. Mucosa normal. No drainage or sinus tenderness. Throat: lips, mucosa, and tongue normal; teeth and gums normal Neck: no adenopathy, no carotid bruit, no JVD, supple, symmetrical, trachea midline and thyroid not enlarged, symmetric, no tenderness/mass/nodules Back: symmetric, no curvature. ROM normal. No CVA tenderness. Lungs: clear to auscultation bilaterally Breasts: gyn Heart: regular rate and rhythm, S1, S2 normal, no murmur, click, rub or gallop Abdomen: soft, non-tender; bowel sounds normal; no masses,  no organomegaly Pelvic: deferred--gyn Extremities: extremities normal, atraumatic, no cyanosis or edema Pulses: 2+ and symmetric Skin: Skin color, texture, turgor normal. No rashes or lesions Lymph nodes: Cervical, supraclavicular, and axillary nodes normal. Neurologic: Alert and oriented X 3, normal strength and tone. Normal symmetric reflexes. Normal coordination and gait Psych- no depression, no anxiety      Assessment:    Healthy female exam.      Plan:    ghm utd Check labs See After Visit Summary for Counseling Recommendations   1. Family history of ischemic heart disease Pt requested ekg -  EKG 12-Lead  2. Preventative health care Check labs  3. Need for shingles vaccine Pt aware insurance may not pay for it but she wanted it anyway - Varicella-zoster vaccine  subcutaneous

## 2013-03-26 NOTE — Patient Instructions (Signed)

## 2013-04-17 ENCOUNTER — Other Ambulatory Visit: Payer: Self-pay | Admitting: Neurology

## 2013-04-27 ENCOUNTER — Ambulatory Visit (INDEPENDENT_AMBULATORY_CARE_PROVIDER_SITE_OTHER): Payer: Managed Care, Other (non HMO) | Admitting: Family Medicine

## 2013-04-27 ENCOUNTER — Encounter: Payer: Self-pay | Admitting: Family Medicine

## 2013-04-27 VITALS — BP 118/70 | HR 62 | Temp 98.1°F | Wt 214.0 lb

## 2013-04-27 DIAGNOSIS — M67439 Ganglion, unspecified wrist: Secondary | ICD-10-CM

## 2013-04-27 DIAGNOSIS — M6740 Ganglion, unspecified site: Secondary | ICD-10-CM

## 2013-04-27 NOTE — Progress Notes (Signed)
   Subjective:    Patient ID: Jeanette Boone, female    DOB: Aug 15, 1960, 53 y.o.   MRN: [id number]  HPI Pt here c/o nodule on L wrist that has been there for a long time but has recently gotten bigger.  Nontender. No known injury    Review of Systems    as above Objective:   Physical Exam  BP 118/70  Pulse 62  Temp(Src) 98.1 F (36.7 C) (Oral)  Wt 214 lb (97.07 kg)  SpO2 98% General appearance: alert, cooperative, appears stated age and no distress Extremities: L wrist ventral side-+ ganglion cyst      Assessment & Plan:  1. Ganglion cyst of wrist Refer to hand surgery - Ambulatory referral to Orthopedic Surgery

## 2013-04-27 NOTE — Patient Instructions (Signed)
Ganglion Cyst °A ganglion cyst is a noncancerous, fluid-filled lump that occurs near joints or tendons. The ganglion cyst grows out of a joint or the lining of a tendon. It most often develops in the hand or wrist but can also develop in the shoulder, elbow, hip, knee, ankle, or foot. The round or oval ganglion can be pea sized or larger than a grape. Increased activity may enlarge the size of the cyst because more fluid starts to build up.  °CAUSES  °It is not completely known what causes a ganglion cyst to grow. However, it may be related to: °· Inflammation or irritation around the joint. °· An injury. °· Repetitive movements or overuse. °· Arthritis. °SYMPTOMS  °A lump most often appears in the hand or wrist, but can occur in other areas of the body. Generally, the lump is painless without other symptoms. However, sometimes pain can be felt during activity or when pressure is applied to the lump. The lump may even be tender to the touch. Tingling, pain, numbness, or muscle weakness can occur if the ganglion cyst presses on a nerve. Your grip may be weak and you may have less movement in your joints.  °DIAGNOSIS  °Ganglion cysts are most often diagnosed based on a physical exam, noting where the cyst is and how it looks. Your caregiver will feel the lump and may shine a light alongside it. If it is a ganglion, a light often shines through it. Your caregiver may order an X-ray, ultrasound, or MRI to rule out other conditions. °TREATMENT  °Ganglions usually go away on their own without treatment. If pain or other symptoms are involved, treatment may be needed. Treatment is also needed if the ganglion limits your movement or if it gets infected. Treatment options include: °· Wearing a wrist or finger brace or splint. °· Taking anti-inflammatory medicine. °· Draining fluid from the lump with a needle (aspiration). °· Injecting a steroid into the joint. °· Surgery to remove the ganglion cyst and its stalk that is  attached to the joint or tendon. However, ganglion cysts can grow back. °HOME CARE INSTRUCTIONS  °· Do not press on the ganglion, poke it with a needle, or hit it with a heavy object. You may rub the lump gently and often. Sometimes fluid moves out of the cyst. °· Only take medicines as directed by your caregiver. °· Wear your brace or splint as directed by your caregiver. °SEEK MEDICAL CARE IF:  °· Your ganglion becomes larger or more painful. °· You have increased redness, red streaks, or swelling. °· You have pus coming from the lump. °· You have weakness or numbness in the affected area. °MAKE SURE YOU:  °· Understand these instructions. °· Will watch your condition. °· Will get help right away if you are not doing well or get worse. °Document Released: 12/22/1999 Document Revised: 09/18/2011 Document Reviewed: 02/17/2007 °[employer]® Patient Information ©2014 [employer]. ° °

## 2013-04-27 NOTE — Progress Notes (Signed)
Pre visit review using our clinic review tool, if applicable. No additional management support is needed unless otherwise documented below in the visit note. 

## 2013-05-06 ENCOUNTER — Ambulatory Visit: Payer: 59 | Admitting: Neurology

## 2013-05-07 ENCOUNTER — Ambulatory Visit (INDEPENDENT_AMBULATORY_CARE_PROVIDER_SITE_OTHER): Payer: 59 | Admitting: Neurology

## 2013-05-07 ENCOUNTER — Encounter: Payer: Self-pay | Admitting: Neurology

## 2013-05-07 VITALS — BP 111/68 | HR 51 | Ht 67.5 in | Wt 208.0 lb

## 2013-05-07 DIAGNOSIS — G549 Nerve root and plexus disorder, unspecified: Secondary | ICD-10-CM

## 2013-05-07 NOTE — Progress Notes (Signed)
PATIENT: Jeanette Boone DOB: July 30, 1960  REASON FOR VISIT: follow up HISTORY FROM: patient  HISTORY OF PRESENT ILLNESS: Jeanette Boone is a 53 year old right-handed white female with a history of sensory neuronopathy. She returns today for follow-up. The patient complains that she has burning, sharp pins and needles and numbness all over her body from head to toe that she feels has gotten worse, it is more pronounced on the left side. She also has pulsing that occurs on both sides of her body especially in her legs. At times she feels that she as a squeezing sensation around her neck, as if she was being strangled. She states that is painful to wear stockings or socks. This started in 2009 and at that time she saw Dr. Erling Cruz. New symptoms that have occur in the last 6-9 months, she notices that her ankles and feet have gotten "stiff and they hurt." The sensory disturbance has now entered the jaw and teeth, has high sensitivity to hot and cold liquids when she eats or drinks. She also complains of a deeper numbness in both heels and she feels that has disturbed her balance. She takes Cymbalta and gabapentin and reports that has helped take the edge off so she can function. Since the last visit patient has a nodule that she was told was a ganglion cyst, and she sees an orthopedic surgeon in June.   REVIEW OF SYSTEMS: Full 14 system review of systems performed and notable only for:  Constitutional: N/A  Eyes: N/A Ear/Nose/Throat: hearing loss, ear pain, ringing in ears Skin: N/A  Cardiovascular: N/A  Respiratory: N/A  Gastrointestinal: N/A  Genitourinary: N/A Hematology/Lymphatic: N/A  Endocrine: N/A Musculoskeletal: joint pain, aching muscles  Allergy/Immunology: N/A  Neurological:headache, numbness, weakness Psychiatric: N/A Sleep: N/A   ALLERGIES: Allergies  Allergen Reactions  . Clarithromycin     Thrush  . Penicillins     HOME MEDICATIONS: Outpatient Prescriptions Prior to Visit    Medication Sig Dispense Refill  . B Complex Vitamins (B-COMPLEX/B-12 PO) Take by mouth.      . Biotin 1000 MCG tablet Take 1,000 mcg by mouth daily.       . cholecalciferol (VITAMIN D) 1000 UNITS tablet Take 2,000 Units by mouth daily.      . DULoxetine (CYMBALTA) 30 MG capsule Take 1 capsule (30 mg total) by mouth daily.  30 capsule  11  . gabapentin (NEURONTIN) 300 MG capsule TAKE 3 CAPSULES BY MOUTH FOR PAIN FOUR TIMES DAILY  360 capsule  6  . Multiple Vitamin (MULTIVITAMIN) tablet Take 1 tablet by mouth daily.      Marland Kitchen UTOPIC 41 % CREA       . valACYclovir (VALTREX) 500 MG tablet Take 500 mg by mouth daily.        No facility-administered medications prior to visit.    PAST MEDICAL HISTORY: Past Medical History  Diagnosis Date  . Cancer     breast  . Chronic leg pain   . Paresthesias     Generalized  . Thrombocytopenia, secondary     secondary to valtrex  . 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 Obesity   . Endometriosis     PAST SURGICAL HISTORY: Past Surgical History  Procedure Laterality Date  . Hernia repair  7672    umbilical  . Hernia repair  1996  . Cholecystectomy  05/2008  . Uterine polyp removal  06/2008  . Colonoscopy  08/2009  . Port a cath insertion  06/2008  . Breast  surgery  05/2008    lumpectomy - right  . Laparoscopy      FAMILY HISTORY: Family History  Problem Relation Age of Onset  . Heart disease Father     pacemaker----tachycardia  . Stroke Maternal Grandmother   . Heart disease Maternal Grandfather 54    MI  . Stroke Maternal Grandfather   . Heart disease Paternal Grandfather 66    MI  . Hypertension Mother     SOCIAL HISTORY: History   Social History  . Marital Status: Married    Spouse Name: donald    Number of Children: 0  . Years of Education: college   Occupational History  . [employer].    Social History Main Topics  . Smoking status: Former Research scientist (life sciences)  . Smokeless tobacco: Never Used  . Alcohol Use: Yes     Comment: 1  or 2 per week  . Drug Use: No  . Sexual Activity: Yes    Partners: Male   Other Topics Concern  . Not on file   Social History Narrative   Exercise-- no    PHYSICAL EXAM  Filed Vitals:   05/07/13 0757  BP: 111/68  Pulse: 51  Height: 5' 7.5" (1.715 m)  Weight: 208 lb (94.348 kg)   Body mass index is 32.08 kg/(m^2).  Generalized: Well developed, in no acute distress   Neurological examination  Mentation: Alert oriented to time, place, history taking. Follows all commands speech and language fluent Cranial nerve II-XII: Pupils were equal round reactive to light extraocular movements were full, visual field were full on confrontational test.  Motor: The motor testing reveals 5 over 5 strength of all 4 extremities. Good symmetric motor tone is noted throughout.  Sensory: Sensory testing is intact to pinprick, soft touch, vibration sensation, and position sense on all 4 extremities. No evidence of extinction is noted.  Coordination: Cerebellar testing reveals good finger-nose-finger and heel-to-shin bilaterally.  Gait and station: Gait is normal. Tandem gait is normal. Romberg is negative. No drift is seen.  Reflexes: Deep tendon reflexes are symmetric and normal bilaterally.  DIAGNOSTIC DATA (LABS, IMAGING, TESTING) - I reviewed patient records, labs, notes, testing and imaging myself where available.  Lab Results  Component Value Date   WBC 4.7 03/26/2013   HGB 14.1 03/26/2013   HCT 43.1 03/26/2013   MCV 97.4 03/26/2013   PLT 190.0 03/26/2013      Component Value Date/Time   NA 141 03/26/2013 1500   NA 141 09/28/2012 0805   K 4.2 03/26/2013 1500   K 4.5 09/28/2012 0805   CL 104 03/26/2013 1500   CL 105 03/26/2012 1010   CO2 30 03/26/2013 1500   CO2 26 09/28/2012 0805   GLUCOSE 86 03/26/2013 1500   GLUCOSE 93 09/28/2012 0805   GLUCOSE 76 03/26/2012 1010   BUN 13 03/26/2013 1500   BUN 13.1 09/28/2012 0805   CREATININE 1.0 03/26/2013 1500   CREATININE 0.9 09/28/2012 0805   CALCIUM  9.5 03/26/2013 1500   CALCIUM 9.5 09/28/2012 0805   PROT 6.9 03/26/2013 1500   PROT 7.0 09/28/2012 0805   ALBUMIN 4.1 03/26/2013 1500   ALBUMIN 3.4* 09/28/2012 0805   AST 19 03/26/2013 1500   AST 19 09/28/2012 0805   ALT 19 03/26/2013 1500   ALT 19 09/28/2012 0805   ALKPHOS 91 03/26/2013 1500   ALKPHOS 127 09/28/2012 0805   BILITOT 0.5 03/26/2013 1500   BILITOT 0.31 09/28/2012 0805   GFRNONAA >60 07/12/2008 1654  GFRAA  Value: >60        The eGFR has been calculated using the MDRD equation. This calculation has not been validated in all clinical situations. eGFR's persistently <60 mL/min signify possible Chronic Kidney Disease. 07/12/2008 1654   Lab Results  Component Value Date   CHOL 187 03/26/2013   HDL 61.50 03/26/2013   LDLCALC 111* 03/26/2013   LDLDIRECT 140.9 12/18/2011   TRIG 73.0 03/26/2013   CHOLHDL 3 03/26/2013   Lab Results  Component Value Date   HGBA1C 5.6 03/26/2013   Lab Results  Component Value Date   VITAMINB12 701 02/03/2008   Lab Results  Component Value Date   TSH 1.68 03/26/2013      ASSESSMENT AND PLAN 53 y.o. year old female  has a past medical history of Cancer; Chronic leg pain; Paresthesias; Thrombocytopenia, secondary; Sensory neuronopathy (11/02/2012); Obesity; and Endometriosis. here with  1. Sensory neuronopathy  The patient continues to have sensory symptoms that have worsened over the last year. Previous nerve conductions have been unremarkable. Initial work-up was unremarkable. We will recheck some blood work today. Last MRI was in 2010, we will repeat an MRI due to worsening symptoms to look for any acute changes. The patient should follow-up in 6 months or sooner if needed.    Ward Givens, MSN, NP-C 05/07/2013, 8:21 AM [facility] [address], [place], [place] [address] [phone]  Note: This document was prepared with digital dictation and possible smart phrase technology. Any transcriptional errors that result from  this process are unintentional. Kathrynn Ducking

## 2013-05-07 NOTE — Patient Instructions (Signed)

## 2013-05-12 ENCOUNTER — Telehealth: Payer: Self-pay | Admitting: Neurology

## 2013-05-12 LAB — ANA W/REFLEX: Anti Nuclear Antibody(ANA): NEGATIVE

## 2013-05-12 LAB — LYME DISEASE, WESTERN BLOT
IGG P23 AB.: ABSENT
IGG P30 AB.: ABSENT
IGG P66 AB.: ABSENT
IGM P39 AB.: ABSENT
IgG P18 Ab.: ABSENT
IgG P28 Ab.: ABSENT
IgG P39 Ab.: ABSENT
IgG P41 Ab.: ABSENT
IgG P45 Ab.: ABSENT
IgG P58 Ab.: ABSENT
IgG P93 Ab.: ABSENT
Lyme IgG Wb: NEGATIVE
Lyme IgM Wb: POSITIVE — AB

## 2013-05-12 LAB — VITAMIN B12: Vitamin B-12: 753 pg/mL (ref 211–946)

## 2013-05-12 LAB — SEDIMENTATION RATE: Sed Rate: 6 mm/hr (ref 0–40)

## 2013-05-12 LAB — RHEUMATOID FACTOR: Rhuematoid fact SerPl-aCnc: 30.4 IU/mL — ABNORMAL HIGH (ref 0.0–13.9)

## 2013-05-12 LAB — COPPER, SERUM: COPPER: 124 ug/dL (ref 72–166)

## 2013-05-12 LAB — LYME, TOTAL AB TEST/REFLEX: LYME IGG/IGM AB: 0.92 {ISR} — AB (ref 0.00–0.90)

## 2013-05-12 NOTE — Telephone Encounter (Signed)
I called patient. The blood work was unremarkable with exception that the rheumatoid factor was elevated at 30. It is possible that this has no clinical significance, but the patient does have complaints of foot and ankle discomfort. I do not believe that this would explain all of her sensory complaints. If desired, I will set up a rheumatology referral for an opinion regarding this finding. The patient is to contact our office concerning this.

## 2013-05-13 ENCOUNTER — Telehealth: Payer: Self-pay | Admitting: Neurology

## 2013-05-13 NOTE — Telephone Encounter (Signed)
I called the patient, left a message. The Lyme antibody panel came back with a positive IgM panel. The antibody level is low titer, however. The Lyme antibody panel can cross react with other processes such as lupus or rheumatoid arthritis. Given the fact that the rheumatoid factor is elevated, these 2 findings may be connected. I have recommended a rheumatology referral, if they are amenable to this, I will get this set up.

## 2013-05-13 NOTE — Telephone Encounter (Signed)
Message copied by Margette Fast on Thu May 13, 2013 12:58 PM ------      Message from: Davison      Created: Wed May 12, 2013  4:59 PM                   ----- Message -----         From: [facility] Lab Results In Interface         Sent: 05/08/2013   7:45 AM           To: Ward Givens, NP             ------

## 2013-05-19 DIAGNOSIS — R209 Unspecified disturbances of skin sensation: Secondary | ICD-10-CM

## 2013-05-20 ENCOUNTER — Telehealth: Payer: Self-pay | Admitting: Neurology

## 2013-05-20 NOTE — Telephone Encounter (Signed)
I called patient. MRI the brain was done at [facility]. This shows several nonspecific white matter changes, very similar to what was seen in 2010 on MRI scan done through the [place] system. Again, if the patient is amenable to a rheumatology referral, and she is to contact our office.

## 2013-05-21 ENCOUNTER — Telehealth: Payer: Self-pay | Admitting: Neurology

## 2013-05-21 ENCOUNTER — Other Ambulatory Visit: Payer: Self-pay | Admitting: Diagnostic Neuroimaging

## 2013-05-21 ENCOUNTER — Other Ambulatory Visit: Payer: Self-pay | Admitting: *Deleted

## 2013-05-21 DIAGNOSIS — C50211 Malignant neoplasm of upper-inner quadrant of right female breast: Secondary | ICD-10-CM

## 2013-05-21 DIAGNOSIS — G549 Nerve root and plexus disorder, unspecified: Secondary | ICD-10-CM

## 2013-05-21 NOTE — Telephone Encounter (Signed)
Message copied by Margette Fast on Fri May 21, 2013  4:11 PM ------      Message from: Paola, Barb Merino      Created: Fri May 21, 2013  3:27 PM                   ----- Message -----         From: Penni Bombard, MD         Sent: 05/21/2013   3:22 PM           To: Ward Givens, NP             ------

## 2013-05-21 NOTE — Telephone Encounter (Signed)
I have already called the report of the MRI the brain to the patient.   MRI brain 05/20/2013:  IMPRESSION:  Equivocal MRI brain (with and without) demonstrating: 1. Few punctate periventricular and subcortical and juxtacortical foci of non specific T2 hyperintensities. No abnormal enhancing lesions. These findings are non-specific and considerations include autoimmune, inflammatory, post-infectious, microvascular ischemic or migraine associated etiologies.  2. Compared to MRI on 05/26/08, there are a few more small white matter lesions seen in the current study.

## 2013-05-24 ENCOUNTER — Other Ambulatory Visit (HOSPITAL_BASED_OUTPATIENT_CLINIC_OR_DEPARTMENT_OTHER): Payer: Managed Care, Other (non HMO)

## 2013-05-24 DIAGNOSIS — C50219 Malignant neoplasm of upper-inner quadrant of unspecified female breast: Secondary | ICD-10-CM

## 2013-05-24 DIAGNOSIS — C50211 Malignant neoplasm of upper-inner quadrant of right female breast: Secondary | ICD-10-CM

## 2013-05-24 LAB — COMPREHENSIVE METABOLIC PANEL (CC13)
ALBUMIN: 3.7 g/dL (ref 3.5–5.0)
ALT: 14 U/L (ref 0–55)
AST: 17 U/L (ref 5–34)
Alkaline Phosphatase: 94 U/L (ref 40–150)
Anion Gap: 8 mEq/L (ref 3–11)
BUN: 14.9 mg/dL (ref 7.0–26.0)
CO2: 26 mEq/L (ref 22–29)
CREATININE: 1 mg/dL (ref 0.6–1.1)
Calcium: 10 mg/dL (ref 8.4–10.4)
Chloride: 107 mEq/L (ref 98–109)
GLUCOSE: 91 mg/dL (ref 70–140)
Potassium: 4.6 mEq/L (ref 3.5–5.1)
Sodium: 142 mEq/L (ref 136–145)
Total Bilirubin: 0.52 mg/dL (ref 0.20–1.20)
Total Protein: 6.8 g/dL (ref 6.4–8.3)

## 2013-05-24 LAB — CBC WITH DIFFERENTIAL/PLATELET
BASO%: 0.9 % (ref 0.0–2.0)
Basophils Absolute: 0 10*3/uL (ref 0.0–0.1)
EOS ABS: 0.2 10*3/uL (ref 0.0–0.5)
EOS%: 3.1 % (ref 0.0–7.0)
HCT: 42.7 % (ref 34.8–46.6)
HEMOGLOBIN: 14.1 g/dL (ref 11.6–15.9)
LYMPH%: 34.1 % (ref 14.0–49.7)
MCH: 31.7 pg (ref 25.1–34.0)
MCHC: 33 g/dL (ref 31.5–36.0)
MCV: 96 fL (ref 79.5–101.0)
MONO#: 0.3 10*3/uL (ref 0.1–0.9)
MONO%: 6.7 % (ref 0.0–14.0)
NEUT%: 55.2 % (ref 38.4–76.8)
NEUTROS ABS: 2.8 10*3/uL (ref 1.5–6.5)
PLATELETS: 176 10*3/uL (ref 145–400)
RBC: 4.44 10*6/uL (ref 3.70–5.45)
RDW: 12.9 % (ref 11.2–14.5)
WBC: 5 10*3/uL (ref 3.9–10.3)
lymph#: 1.7 10*3/uL (ref 0.9–3.3)

## 2013-05-31 ENCOUNTER — Ambulatory Visit: Payer: Managed Care, Other (non HMO) | Admitting: Oncology

## 2013-06-02 ENCOUNTER — Telehealth: Payer: Self-pay | Admitting: Oncology

## 2013-06-02 ENCOUNTER — Ambulatory Visit (HOSPITAL_BASED_OUTPATIENT_CLINIC_OR_DEPARTMENT_OTHER): Payer: Managed Care, Other (non HMO) | Admitting: Oncology

## 2013-06-02 VITALS — BP 116/75 | HR 48 | Temp 97.7°F | Resp 18 | Ht 67.5 in | Wt 203.9 lb

## 2013-06-02 DIAGNOSIS — R209 Unspecified disturbances of skin sensation: Secondary | ICD-10-CM

## 2013-06-02 DIAGNOSIS — C50211 Malignant neoplasm of upper-inner quadrant of right female breast: Secondary | ICD-10-CM

## 2013-06-02 DIAGNOSIS — Z853 Personal history of malignant neoplasm of breast: Secondary | ICD-10-CM

## 2013-06-02 NOTE — Progress Notes (Signed)
ID: Jeanette Boone   DOB: 08-17-1960  MR#: [id number]  GGY#:[id number]  PCP: Jeanette Koyanagi MD GYN: Jeanette Bruins MD SU: Jeanette Skates MD OTHER MD: Jeanette Boone, Jeanette Boone, Jeanette Boone, Jeanette Boone  BREAST CANCER HISTORY:  She herself palpated a mass in her right breast in 04/2008.  The patient tells me that she had lost approximately 100 pounds by diet and exercise over the past year, and that this made her breasts lumpier feeling.  In any case, she brought this question to Dr. Truddie Hidden Boone's attention, and was set up for mammography at [facility] on 04/21/2008.  She had had a mammogram in 09/2007, which had been unremarkable.  The mammogram on 05/01/2008 showed a dense right breast with a suggestion of a possible mass in the upper central breast corresponding to the palpable abnormality.  Ultrasound was more revealing, and did show an irregularly bordered hypoechoic solid mass measuring 1.2 cm.  The patient was brought back for biopsy and clip placement on 04/23, and the pathology from that procedure (Jeanette Boone and Jeanette Boone) showed an invasive ductal carcinoma which was negative for the estrogen and progesterone receptor at 0 and 0 respectively.  The Ki-67 was 19%, and CISH was amplified with a ratio of 2.22 (this is just above the "indeterminate" range).  Bilateral breast MRIs were obtained on 04/30.  This showed in the right breast an enhancing rounded mass measuring up to 1.8 cm.  The left breast was unremarkable, and there were no enlarged or suspicious internal mammary or axillary nodes on either side.  With this information, the patient proceeded to definitive right lumpectomy and sentinel lymph node sampling on 05/16/2008.  The final pathology (Jeanette Boone) confirmed a 1.5 cm invasive ductal carcinoma, grade 2, with negative margins, no evidence of lymphovascular invasion, and 0 of 3 sentinel lymph nodes involved.  The estrogen receptor test was repeated (Jeanette Boone), and was  again negative with a strong positive control. Her subsequent history is as detailed below.   INTERVAL HISTORY:  Jeanette Boone returns for followup of her right breast cancer. The interval history is unremarkable. 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 She is very pleased that this is resulting in a little bit of weight loss.  REVIEW OF SYSTEMS:  Jeanette Boone continues to have her hyperesthesia symptoms, now followed by Jeanette Boone. In addition over the last few months she has had some joint stiffness and pains in her legs ankles and feet. She was screened for rheumatoid arthritis, with equivocal results, but the overall picture is not suggestive of an inflammatory arthropathy. Aside from this of course she continues to have her hypersensitivity but there have been no changes in her medication now for quite a while and she is able to get everything done that she needs to get done, she says, despite the symptoms. A detailed review of systems today was otherwise stable  PAST MEDICAL HISTORY: Past Medical History  Diagnosis Date  . Cancer     breast  . Chronic leg pain   . Paresthesias     Generalized  . Thrombocytopenia, secondary     secondary to valtrex  . 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 Obesity   . Endometriosis   1. Obesity.  As stated, the patient has been on a very strict 1200-1400 calorie diet and 5 days of exercise for over a year, and tells me she has lost over 100 pounds.  She tells me she has done this in the past, and that she loses weight easily, but also gains weight easily.  She is now a bit below where she would like to be, her goal being approximately 140 pounds. 2. She had an episode of cholecystitis, and underwent cholecystectomy on 06/01/2008 under Dr. Dalbert Boone.  The pathology there ([id number]) showed only chronic cholecystitis and cholelithiasis with a benign cystic duct lymph node. 3. The patient has a history of  endometriosis. 4. Status post umbilical hernia repair. 5. The patient tried for many years to get pregnant and took "large doses of fertility drugs" for about 8 or 9 years before giving up on those attempts.       6.   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          7.  History of poorly understood neurologic symptom of  hyperesthesia, controlled on neurontin    PAST SURGICAL HISTORY: S/p cholecystectomy  FAMILY HISTORY The patient's father died at the age of 38 from causes not quite clear to the patient, but he certainly had significant heart disease.  The patient's mother is alive.  She has a history of bladder cancer.  Both the patient's parents were single children.  The patient has one sister and two brothers.  There is no history of breast cancer in the immediate family.  GYNECOLOGIC HISTORY: She is GX, P0. She was having regular periods at the time of her cancer diagnosis, these were interrupted by chemotherapy, then resumed. Her periods are now irregular. She is having mild hot flashes.  SOCIAL HISTORY: Jeanette Boone works as a Paramedic.  Her husband, Jeanette Boone, is an Chief Financial Officer.  Their two children, Jeanette Boone and Jeanette Boone,  were adopted from [place].  The patient attends the Jeanette Boone.    ADVANCED DIRECTIVES:  HEALTH MAINTENANCE: History  Substance Use Topics  . Smoking status: Former Research scientist (life sciences)  . Smokeless tobacco: Never Used  . Alcohol Use: Yes     Comment: 1 or 2 per week     Colonoscopy: 2011  PAP: UTD  Bone density:  Lipid panel:  Allergies  Allergen Reactions  . Clarithromycin     Thrush  . Penicillins     Current Outpatient Prescriptions  Medication Sig Dispense Refill  . B Complex Vitamins (B-COMPLEX/B-12 PO) Take by mouth.      . Biotin 1000 MCG tablet Take 1,000 mcg by mouth daily.       . cholecalciferol (VITAMIN D) 1000 UNITS tablet Take 2,000 Units by mouth daily.      . DULoxetine (CYMBALTA) 30 MG capsule  Take 1 capsule (30 mg total) by mouth daily.  30 capsule  11  . gabapentin (NEURONTIN) 300 MG capsule TAKE 3 CAPSULES BY MOUTH FOR PAIN FOUR TIMES DAILY  360 capsule  6  . Multiple Vitamin (MULTIVITAMIN) tablet Take 1 tablet by mouth daily.      Marland Kitchen UTOPIC 41 % CREA       . valACYclovir (VALTREX) 500 MG tablet Take 500 mg by mouth daily.        No current facility-administered medications for this visit.    OBJECTIVE: Middle-aged white woman who appears well Filed Vitals:   06/02/13 1301  BP: 116/75  Pulse: 48  Temp: 97.7 F (36.5 C)  Resp: 18     Body mass index is 31.45 kg/(m^2).    ECOG FS: 1 Filed Weights   06/02/13 1301  Weight: 203 lb  14.4 oz (92.488 kg)   Sclerae unicteric,  Oropharynx clear,  No cervical or supraclavicular adenopathy Lungs clear bilaterally, no rales or rhonchi Heart regular rate and rhythm Abdomen soft,  nontender with positive bowel sounds.  MSK no focal spinal tenderness, no right upper extremity lymphedema  Neuro: nonfocal, well oriented,  appropriate affect Breasts: Right breast  is smaller than the left. It is status post lumpectomy and radiation. There is no evidence of local recurrence. The right axilla is benign. The left  breast is unremarkable.   LAB RESULTS: Lab Results  Component Value Date   WBC 5.0 05/24/2013   NEUTROABS 2.8 05/24/2013   HGB 14.1 05/24/2013   HCT 42.7 05/24/2013   MCV 96.0 05/24/2013   PLT 176 05/24/2013      Chemistry      Component Value Date/Time   NA 142 05/24/2013 0828   NA 141 03/26/2013 1500   K 4.6 05/24/2013 0828   K 4.2 03/26/2013 1500   CL 104 03/26/2013 1500   CL 105 03/26/2012 1010   CO2 26 05/24/2013 0828   CO2 30 03/26/2013 1500   BUN 14.9 05/24/2013 0828   BUN 13 03/26/2013 1500   CREATININE 1.0 05/24/2013 0828   CREATININE 1.0 03/26/2013 1500      Component Value Date/Time   CALCIUM 10.0 05/24/2013 0828   CALCIUM 9.5 03/26/2013 1500   ALKPHOS 94 05/24/2013 0828   ALKPHOS 91 03/26/2013 1500   AST 17  05/24/2013 0828   AST 19 03/26/2013 1500   ALT 14 05/24/2013 0828   ALT 19 03/26/2013 1500   BILITOT 0.52 05/24/2013 0828   BILITOT 0.5 03/26/2013 1500        STUDIES: Mr Jeri Cos ER Contrast  05-28-13   [facility]  NEUROIMAGING REPORT   STUDY DATE: 05/19/13 PATIENT NAME: ANGELLY SPEARING DOB: [birth year] MRN: [id number]  ORDERING CLINICIAN: Kathrynn Ducking, MD  CLINICAL HISTORY: 53 year old female with burning and numbness.  EXAM: MRI brain (with and without)  TECHNIQUE: MRI of the brain with and without contrast was obtained utilizing 5 mm axial slices with T1, T2, T2 flair, T2 star gradient echo and diffusion weighted views.  T1 sagittal, T2 coronal and postcontrast views in the axial and coronal plane were obtained.  CONTRAST: 11m gadavist [place]ING [place](1.2 Tesla MRI)   FINDINGS:  No abnormal lesions are seen on diffusion-weighted views to suggest acute ischemia. The cortical sulci, fissures and cisterns are normal in size and appearance. Lateral, third and fourth ventricle are normal in size and appearance. No extra-axial fluid collections are seen. No evidence of mass effect or midline shift.    Few punctate periventricular and subcortical and juxtacortical foci of non specific T2 hyperintensities.   No abnormal lesions are seen on post contrast views.  On sagittal views the posterior fossa, pituitary gland and corpus callosum are unremarkable. No evidence of intracranial hemorrhage on gradient-echo views. The orbits and their contents, paranasal sinuses and calvarium are unremarkable.  Intracranial flow voids are present.   IMPRESSION:  Equivocal MRI brain (with and without) demonstrating: 1. Few punctate periventricular and subcortical and juxtacortical foci of non specific T2 hyperintensities. No abnormal enhancing lesions. These findings are non-specific and considerations include autoimmune, inflammatory, post-infectious, microvascular ischemic or migraine  associated etiologies.  2. Compared to MRI on 05/26/08, there are a few more small white matter lesions seen in the current study.   INTERPRETING PHYSICIAN:  VIKRAM R. PENUMALLI,  MD Certified in Neurology, Neurophysiology and [facility] [address] [place], [place] [address] [phone]    ASSESSMENT: 52 y.o.  BRCA negative [place] woman  (1) status post right lumpectomy and sentinel lymph node dissection May 2010 for a T1c N0, stage 1A invasive ductal carcinoma,  grade 2,  estrogen and progesterone receptor negative, but HER-2/neu positive.  The MIB-1 was 19%.   (2) received 4 cycles of docetaxel/carboplatin/trastuzumab adjuvantly  (3) she continued Herceptin to July 2010 with echocardiogram showing a well-preserved ejection fraction after the completion of treatment.  (4) she completed radiation in December 2010.   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  I have no explanation for her continuing hyperesthesia. This is now mixed with some arthritic symptoms. I think it is reasonable for her to see a rheumatologist but I don't see any evidence of rheumatoid arthritis in her case, particularly with no joint deformities and a normal ESR.  I suspect were never going to have a totally clear answer as to her symptoms. They seem to we will controlled on her current medications. As far as breast cancer followup is concerned she will see me once a year for an additional 5 years. We will continue to do yearly mammography, and reserve MRIs for evaluation of suspicious findings or new symptoms.  She has a good understanding of the overall plan and agrees with it. She knows the goal of treatment in her case is cure. She will call with any problems that may develop before her next visit  here. Virgie Dad Magrinat    06/02/2013

## 2013-06-02 NOTE — Telephone Encounter (Signed)
per pof to sch appt-sch appt and gave pt copy of sch °

## 2013-06-30 ENCOUNTER — Encounter (INDEPENDENT_AMBULATORY_CARE_PROVIDER_SITE_OTHER): Payer: Self-pay | Admitting: General Surgery

## 2013-07-07 ENCOUNTER — Other Ambulatory Visit: Payer: Self-pay | Admitting: Oncology

## 2013-07-07 DIAGNOSIS — Z853 Personal history of malignant neoplasm of breast: Secondary | ICD-10-CM

## 2013-07-20 ENCOUNTER — Ambulatory Visit
Admission: RE | Admit: 2013-07-20 | Discharge: 2013-07-20 | Disposition: A | Discharge status: Home / Self Care | Payer: Managed Care, Other (non HMO) | Source: Ambulatory Visit | Attending: Oncology | Admitting: Oncology

## 2013-07-20 ENCOUNTER — Encounter (INDEPENDENT_AMBULATORY_CARE_PROVIDER_SITE_OTHER): Payer: Self-pay

## 2013-07-20 DIAGNOSIS — Z853 Personal history of malignant neoplasm of breast: Secondary | ICD-10-CM

## 2013-09-06 ENCOUNTER — Encounter (INDEPENDENT_AMBULATORY_CARE_PROVIDER_SITE_OTHER): Payer: Self-pay | Admitting: General Surgery

## 2013-09-06 ENCOUNTER — Ambulatory Visit (INDEPENDENT_AMBULATORY_CARE_PROVIDER_SITE_OTHER): Payer: Managed Care, Other (non HMO) | Admitting: General Surgery

## 2013-09-06 VITALS — BP 118/64 | HR 48 | Temp 97.6°F | Ht 67.0 in | Wt 190.1 lb

## 2013-09-06 DIAGNOSIS — C50219 Malignant neoplasm of upper-inner quadrant of unspecified female breast: Secondary | ICD-10-CM

## 2013-09-06 DIAGNOSIS — C50211 Malignant neoplasm of upper-inner quadrant of right female breast: Secondary | ICD-10-CM

## 2013-09-06 NOTE — Progress Notes (Signed)
Patient ID: Jeanette Boone, female   DOB: 1960-05-09, 53 y.o.   MRN: [id number]   History: Jeanette Boone is a 53 y.o. female. She returns for long-term followup regarding her Boone breast cancer.  On 05/16/2008 she underwent Boone partial mastectomy and sentinel node biopsy. The tumor was pathologic stage T1c, ., N0, receptor-negative, HER-2 positive, invasive ductal carcinoma. I inserted a Port-A-Cath on 06/16/2008. She underwent a Herceptin-based chemotherapy. Port-A-Cath was removed on 09/09/2009. She has no evidence of recurrent disease to date.      She's noticed a little tightness in her upper latissimus the past few weeks but is not painful.  Bilateral breast MRI was performed on 07/03/2010 and looked good. Bilateral diagnostic mammograms were performed this year, on 07/20/2013 and these are category 2, no focal abnormality.       She sees Dr. Jana Hakim every 6 months.      We have discussed referral to plastic surgery for reduction mammoplasty to achieve symmetry and she has declined that again today.   ROS she continues to follow with neurology regarding neurologic symptoms, felt to be autoimmune, probably related to her breast cancer. She is to see Jeanette Boone and is going to reestablish with Jeanette Boone. She has gained weight and notices a little more asymmetry of her breasts as a result. She admits to anxiety because she had a friend that died of breast cancer.   Exam:  Constitutional: She is oriented. She appears well-developed and well-nourished. No distress.  Eyes: Conjunctivae and EOM are normal. Pupils are equal, round, and reactive to light.  Neck: Neck supple. No JVD present. No tracheal deviation present. No thyromegaly present.  Cardiovascular: Normal rate, regular rhythm, normal heart sounds and intact distal pulses.  No murmur heard.  Pulmonary/Chest: Effort normal and breath sounds normal. No respiratory distress. She has no wheezes. She has no rales. She exhibits no tenderness.   Boone breast is somewhat smaller than left breast. Both are moderately large and ptotic. Well-healed scar Boone breast upper inner quadrant and Boone axillary area. Good contour and nipple projection.No palpable mass in either breast. No other skin change. Good range of motion Boone shoulder. No arm swelling.  Musculoskeletal: She exhibits no edema and no tenderness.  Lymphadenopathy:  She has no cervical adenopathy.  Neurological: She is alert and oriented to person, place, and time. She exhibits normal muscle tone. Coordination normal.  Skin: Skin is warm. No rash noted. She is not diaphoretic. No erythema. No pallor.  Psychiatric: She has a normal mood and affect. Her behavior is normal. Judgment and thought content normal   Assessment: Invasive ductal carcinoma Boone breast, pathologic stage TI C., N0, receptor-negative, HER-2 positive.  No evidence of recurrent disease 5 years following Boone partial mastectomy, sentinel node biopsy, radiation therapy and Herceptin-based chemotherapy  increased breast density   Plan: She was reassured that there was no evidence of local disease.  Continue neurologic follow up with Dr. Neurology. Continue gabapentin  She request continued followup with both me and Dr. Jana Hakim. Return to see me in one year after her annual mammograms.     Jeanette Boone. Jeanette Boone, M.D., [facility]  General and Minimally invasive Surgery  Breast and Colorectal Surgery  Office: [phone]  Pager: [phone]

## 2013-09-06 NOTE — Patient Instructions (Signed)
Examination of your breasts and all of the regional lymph node areas is normal. Your mammograms from last month are also normal. There is no evidence of cancer  We further discussed long-term surveillance. You will return to see Dr. Jana Hakim on a regular basis and also return to see Dr. Dalbert Batman in one year.

## 2013-11-04 ENCOUNTER — Ambulatory Visit (INDEPENDENT_AMBULATORY_CARE_PROVIDER_SITE_OTHER): Payer: Managed Care, Other (non HMO)

## 2013-11-04 DIAGNOSIS — Z23 Encounter for immunization: Secondary | ICD-10-CM

## 2013-11-05 ENCOUNTER — Telehealth: Payer: Self-pay | Admitting: *Deleted

## 2013-11-05 NOTE — Telephone Encounter (Signed)
Patient dropped off a health screening results form. Form filled out as much as possible and forwarded to Dr. Etter Sjogren. JG//CMA

## 2013-11-09 ENCOUNTER — Encounter: Payer: Self-pay | Admitting: Neurology

## 2013-11-09 ENCOUNTER — Ambulatory Visit (INDEPENDENT_AMBULATORY_CARE_PROVIDER_SITE_OTHER): Payer: 59 | Admitting: Neurology

## 2013-11-09 VITALS — Ht 67.0 in | Wt 184.6 lb

## 2013-11-09 DIAGNOSIS — R930 Abnormal findings on diagnostic imaging of skull and head, not elsewhere classified: Secondary | ICD-10-CM

## 2013-11-09 DIAGNOSIS — R9089 Other abnormal findings on diagnostic imaging of central nervous system: Secondary | ICD-10-CM

## 2013-11-09 DIAGNOSIS — G549 Nerve root and plexus disorder, unspecified: Secondary | ICD-10-CM

## 2013-11-09 MED ORDER — GABAPENTIN 300 MG PO CAPS: 900.0000 mg | ORAL_CAPSULE | Freq: Four times a day (QID) | ORAL | Status: DC

## 2013-11-09 MED ORDER — DULOXETINE HCL 30 MG PO CPEP: 30.0000 mg | ORAL_CAPSULE | Freq: Every day | ORAL | Status: DC

## 2013-11-09 NOTE — Patient Instructions (Signed)

## 2013-11-09 NOTE — Progress Notes (Signed)
Reason for visit: sensory alteration  Jeanette Boone is an 53 y.o. female  History of present illness:  Ms. Dudas is a 53 year old right-handed white female with a history of sensory alteration throughout the entire body including the head, face, body, arms, and legs. The patient had alteration in sensation beginning in 2010, preceding the diagnosis of breast cancer. The patient indicated that she did have chemotherapy, but the sensory alterations were already present before chemotherapy started. Chemotherapy did worsen the sensory symptoms, however. The patient has not had any progression in her sensory alterations, she has not had any weakness or changes in bowel or bladder control. She denies any balance with walking. She does have some cognitive issues that she relates to the use of gabapentin in high dose taking 900 mg 4 times daily. The patient has had a recent MRI of the brain that shows a minimal increase in the white matter changes that were present. The patient previously was seen and evaluated by Dr. Erling Cruz, and she underwent lumbar puncture to evaluate for demyelinating disease that was unremarkable. 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 The patient has had recent blood work that shows an elevation in the rheumatoid factor, and the patient had 2 out of 3 IgM bands for Lyme disease. The patient returns to this office for an evaluation. The patient is amenable to seeing a rheumatologist, Dr. [name].  Past Medical History  Diagnosis Date  . Cancer     breast  . Chronic leg pain   . Paresthesias     Generalized  . Thrombocytopenia, secondary     secondary to valtrex  . 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 Obesity   . Endometriosis     Past Surgical History  Procedure Laterality Date  . Hernia repair  2536    umbilical  . Hernia repair  1996  . Cholecystectomy  05/2008  . Uterine polyp removal  06/2008  .  Colonoscopy  08/2009  . Port a cath insertion  06/2008  . Breast surgery  05/2008    lumpectomy - right  . Laparoscopy      Family History  Problem Relation Age of Onset  . Heart disease Father     pacemaker----tachycardia  . Stroke Maternal Grandmother   . Heart disease Maternal Grandfather 17    MI  . Stroke Maternal Grandfather   . Heart disease Paternal Grandfather 76    MI  . Hypertension Mother     Social history:  reports that she has quit smoking. She has never used smokeless tobacco. She reports that she drinks alcohol. She reports that she does not use illicit drugs.    Allergies  Allergen Reactions  . Clarithromycin     Thrush  . Penicillins     Medications:  Current Outpatient Prescriptions on File Prior to Visit  Medication Sig Dispense Refill  . B Complex Vitamins (B-COMPLEX/B-12 PO) Take by mouth.    . Biotin 1000 MCG tablet Take 1,000 mcg by mouth daily.     . cholecalciferol (VITAMIN D) 1000 UNITS tablet Take 2,000 Units by mouth daily.    . Multiple Vitamin (MULTIVITAMIN) tablet Take 1 tablet by mouth daily.    Marland Kitchen UTOPIC 41 % CREA     . valACYclovir (VALTREX) 500 MG tablet Take 500 mg by mouth daily.      No current facility-administered medications on file prior to visit.  ROS:  Out of a complete 14 system review of symptoms, the patient complains only of the following symptoms, and all other reviewed systems are negative.  Hearing changes, ringing in the ears Joint pain, achy muscles, muscle cramps, neck stiffness Numbness, burning sensations, shock sensations  Height 5\' 7"  (1.702 m), weight 184 lb 9.6 oz (83.734 kg).  Physical Exam  General: The patient is alert and cooperative at the time of the examination.  Skin: No significant peripheral edema is noted.   Neurologic Exam  Mental status: The patient is oriented x 3.  Cranial nerves: Facial symmetry is present. Speech is normal, no aphasia or dysarthria is noted. Extraocular  movements are full. Visual fields are full.  Motor: The patient has good strength in all 4 extremities.  Sensory examination: Soft touch sensation is symmetric on the face, arms, and legs.  Coordination: The patient has good finger-nose-finger and heel-to-shin bilaterally.  Gait and station: The patient has a normal gait. Tandem gait is normal. Romberg is negative. No drift is seen.  Reflexes: Deep tendon reflexes are symmetric.   Assessment/Plan:  1. Sensory alteration, all 4 extremities  2. History of breast cancer  The etiology of her sensory symptoms is not clear, but they have not progressed over the last 5 years. There has been some minimal progression of white matter changes on MRI the brain, and for this reason, MRI of the cervical spine will be done looking for evidence of demyelinating disease. Further blood work will be done, and the patient will be referred for second opinion through Dr. [name]. The patient will follow-up in 6 months. She will be given prescriptions for her Cymbalta and gabapentin.  Jill Alexanders MD 11/10/2013 7:44 AM  [facility] [address] [place], [place] [address]  Phone [phone] Fax [phone]

## 2013-11-12 ENCOUNTER — Telehealth: Payer: Self-pay | Admitting: Family Medicine

## 2013-11-12 NOTE — Telephone Encounter (Signed)
Caller name: [place], Winkowski Relation to pt: self  Call back number: [phone]  Reason for call: pt inquiring about health screening results form. Would like to pick up today on her lunch break.

## 2013-11-12 NOTE — Telephone Encounter (Signed)
Called and left message for patient informing her that completed form is at our front desk for her to pick up. JG//CMA

## 2013-11-13 LAB — CARDIOLIPIN ANTIBODY
Anticardiolipin IgG: <9 GPL U/mL (ref 0–14)
Anticardiolipin IgM: 12 MPL U/mL (ref 0–12)

## 2013-11-13 LAB — ANTIMYELOPEROXIDASE (MPO) ABS

## 2013-11-13 LAB — LUPUS ANTICOAGULANT
DPT: 38 s (ref 0.0–55.0)
DRVVT: 30.7 s (ref 0.0–55.1)
PTT LA: 33.6 s (ref 0.0–50.0)
Thrombin Time: 16.5 s (ref 0.0–20.0)
dPT Confirm Ratio: 0.92 Ratio (ref 0.00–1.20)

## 2013-11-13 LAB — FACTOR 5 LEIDEN

## 2013-11-13 LAB — LYME, TOTAL AB TEST/REFLEX

## 2013-11-13 LAB — ANGIOTENSIN CONVERTING ENZYME: Angio Convert Enzyme: 37 U/L (ref 14–82)

## 2013-11-13 LAB — ANTINEUTROPHIL CYTOPLASMIC AB
C-ANCA: <1:20 {titer}
P-ANCA: <1:20 {titer}

## 2013-11-13 LAB — SEDIMENTATION RATE: SED RATE: 3 mm/h (ref 0–40)

## 2013-11-13 LAB — C-REACTIVE PROTEIN: CRP: 0.7 mg/L (ref 0.0–4.9)

## 2013-11-16 ENCOUNTER — Encounter: Payer: Self-pay | Admitting: Neurology

## 2013-11-22 ENCOUNTER — Ambulatory Visit
Admission: RE | Admit: 2013-11-22 | Discharge: 2013-11-22 | Disposition: A | Discharge status: Home / Self Care | Payer: Managed Care, Other (non HMO) | Source: Ambulatory Visit | Attending: Neurology | Admitting: Neurology

## 2013-11-22 DIAGNOSIS — G549 Nerve root and plexus disorder, unspecified: Secondary | ICD-10-CM

## 2013-11-22 DIAGNOSIS — R930 Abnormal findings on diagnostic imaging of skull and head, not elsewhere classified: Secondary | ICD-10-CM

## 2013-11-22 DIAGNOSIS — R9089 Other abnormal findings on diagnostic imaging of central nervous system: Secondary | ICD-10-CM

## 2013-11-22 MED ORDER — GADOBENATE DIMEGLUMINE 529 MG/ML IV SOLN
17.0000 mL | Freq: Once | INTRAVENOUS | Status: AC | PRN
  Administered 2013-11-22: 17 mL via INTRAVENOUS

## 2013-11-23 ENCOUNTER — Telehealth: Payer: Self-pay | Admitting: Neurology

## 2013-11-23 NOTE — Telephone Encounter (Signed)
I called the patient. The MRI the cervical spine does not show anything that would explain total body numbness. The patient does have an elevation in the rheumatoid factor from May 2015. The patient was to be referred to Dr. [name] for an opinion regarding this.   MRI cervical spine 11/23/2013:  IMPRESSION:  Abnormal MRI cervical spine (with and without) demonstrating: 1. At C5-6: disc bulging and uncovertebral joint hypertrophy with severe right foraminal stenosis. 2. At C3-4: uncovertebral joint and facet hypertrophy with mild right foraminal stenosis. 3. At C6-7: left uncovertebral joint hypertrophy with mild left foraminal stenosis. 4. No intrinsic or abnormal enhancing spinal cord lesions. 5. Compared to MRI on 05/26/08, there is mild progression of disc disease at C5-6, C3-4, C6-7.

## 2013-11-24 ENCOUNTER — Other Ambulatory Visit: Payer: Self-pay | Admitting: Neurology

## 2013-11-24 DIAGNOSIS — R200 Anesthesia of skin: Secondary | ICD-10-CM

## 2013-11-24 NOTE — Telephone Encounter (Signed)
Left message that appointment has been made with Dr. [name], also a message was left this morning by JC in referrals.

## 2014-01-11 ENCOUNTER — Encounter: Payer: Self-pay | Admitting: Family Medicine

## 2014-04-04 ENCOUNTER — Encounter: Payer: Managed Care, Other (non HMO) | Admitting: Family Medicine

## 2014-04-21 ENCOUNTER — Other Ambulatory Visit: Payer: Self-pay | Admitting: Dermatology

## 2014-04-25 ENCOUNTER — Telehealth: Payer: Self-pay | Admitting: Family Medicine

## 2014-04-25 NOTE — Telephone Encounter (Signed)
Pre visit letter sent  °

## 2014-05-10 ENCOUNTER — Encounter: Payer: Managed Care, Other (non HMO) | Admitting: Family Medicine

## 2014-05-10 ENCOUNTER — Encounter: Payer: Self-pay | Admitting: Neurology

## 2014-05-10 ENCOUNTER — Ambulatory Visit (INDEPENDENT_AMBULATORY_CARE_PROVIDER_SITE_OTHER): Payer: 59 | Admitting: Neurology

## 2014-05-10 VITALS — BP 100/74 | HR 52 | Ht 67.0 in | Wt 177.4 lb

## 2014-05-10 DIAGNOSIS — G549 Nerve root and plexus disorder, unspecified: Secondary | ICD-10-CM | POA: Diagnosis not present

## 2014-05-10 NOTE — Progress Notes (Signed)
Reason for visit: Numbness, paresthesias  Jeanette Boone is an 54 y.o. female  History of present illness:  Jeanette Boone is a 54 year old right-handed white female with a history of paresthesias that involved the entire body, left greater than right. The patient has had extensive evaluations in the past. MRI shows minimal white matter changes, but MS workup was negative. The patient has had breast cancer, but her sensory changes began prior to chemotherapy. The patient has some minimal balance changes, no falls. She is on high-dose gabapentin, and low-dose Cymbalta for the discomfort. She does not believe there has been much change in her discomfort level since last seen. The patient has an elevated rheumatoid factor, she was seen by Dr. [name] who did not feel that the rheumatoid factor abnormality was significant. The patient returns to the office today for further evaluation.  Past Medical History  Diagnosis Date  . Cancer     breast  . Chronic leg pain   . Paresthesias     Generalized  . Thrombocytopenia, secondary     secondary to valtrex  . 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 Obesity   . Endometriosis     Past Surgical History  Procedure Laterality Date  . Hernia repair  4696    umbilical  . Hernia repair  1996  . Cholecystectomy  05/2008  . Uterine polyp removal  06/2008  . Colonoscopy  08/2009  . Port a cath insertion  06/2008  . Breast surgery  05/2008    lumpectomy - right  . Laparoscopy      Family History  Problem Relation Age of Onset  . Heart disease Father     pacemaker----tachycardia  . Stroke Maternal Grandmother   . Heart disease Maternal Grandfather 16    MI  . Stroke Maternal Grandfather   . Heart disease Paternal Grandfather 41    MI  . Hypertension Mother     Social history:  reports that she has quit smoking. She has never used smokeless tobacco. She reports that she drinks alcohol. She reports that she does not use illicit drugs.    Allergies    Allergen Reactions  . Clarithromycin     Thrush  . Penicillins     Medications:  Prior to Admission medications   Medication Sig Start Date End Date Taking? Authorizing Provider  B Complex Vitamins (B-COMPLEX/B-12 PO) Take by mouth.    Historical Provider, MD  Biotin 1000 MCG tablet Take 1,000 mcg by mouth daily.     Historical Provider, MD  cholecalciferol (VITAMIN D) 1000 UNITS tablet Take 2,000 Units by mouth daily.    Historical Provider, MD  DULoxetine (CYMBALTA) 30 MG capsule Take 1 capsule (30 mg total) by mouth daily. 11/09/13   Kathrynn Ducking, MD  gabapentin (NEURONTIN) 300 MG capsule Take 3 capsules (900 mg total) by mouth 4 (four) times daily. 11/09/13   Kathrynn Ducking, MD  Multiple Vitamin (MULTIVITAMIN) tablet Take 1 tablet by mouth daily.    Historical Provider, MD  UTOPIC 41 % CREA  04/07/13   Historical Provider, MD  valACYclovir (VALTREX) 500 MG tablet Take 500 mg by mouth daily.     Historical Provider, MD    ROS:  Out of a complete 14 system review of symptoms, the patient complains only of the following symptoms, and all other reviewed systems are negative.  Joint pain, muscle cramps Numbness, burning sensations  Blood pressure 100/74, pulse 52, height 5\' 7"  (1.702 m), weight 177 lb  6.4 oz (80.468 kg).  Physical Exam  General: The patient is alert and cooperative at the time of the examination.  Skin: No significant peripheral edema is noted.   Neurologic Exam  Mental status: The patient is alert and oriented x 3 at the time of the examination. The patient has apparent normal recent and remote memory, with an apparently normal attention span and concentration ability.   Cranial nerves: Facial symmetry is present. Speech is normal, no aphasia or dysarthria is noted. Extraocular movements are full. Visual fields are full.  Motor: The patient has good strength in all 4 extremities.  Sensory examination: Soft touch sensation is symmetric on the face,  arms, and legs.  Coordination: The patient has good finger-nose-finger and heel-to-shin bilaterally.  Gait and station: The patient has a normal gait. Tandem gait is normal. Romberg is negative. No drift is seen.  Reflexes: Deep tendon reflexes are symmetric.   MRI brain 05/21/13:  IMPRESSION:  Equivocal MRI brain (with and without) demonstrating: 1. Few punctate periventricular and subcortical and juxtacortical foci of non specific T2 hyperintensities. No abnormal enhancing lesions. These findings are non-specific and considerations include autoimmune, inflammatory, post-infectious, microvascular ischemic or migraine associated etiologies.  2. Compared to MRI on 05/26/08, there are a few more small white matter lesions seen in the current study.   Assessment/Plan:  1. Sensory dysesthesias, paresthesias all fours  The etiology of the sensory changes is not clear. The patient is controlling her pain somewhat with gabapentin and Cymbalta. We do have room to go up on Cymbalta dose, but the patient does not wish to alter the medication regimen. She will follow-up in 6-8 months. She will contact our office if new issues arise.  Jill Alexanders MD 05/10/2014 9:48 PM  [facility] [address] [place], [place] [address]  Phone [phone] Fax [phone]

## 2014-05-10 NOTE — Patient Instructions (Signed)

## 2014-05-11 ENCOUNTER — Telehealth: Payer: Self-pay

## 2014-05-11 NOTE — Telephone Encounter (Signed)
Pre visit call completed 

## 2014-05-12 ENCOUNTER — Encounter: Payer: Self-pay | Admitting: Family Medicine

## 2014-05-12 ENCOUNTER — Ambulatory Visit (INDEPENDENT_AMBULATORY_CARE_PROVIDER_SITE_OTHER): Payer: Managed Care, Other (non HMO) | Admitting: Family Medicine

## 2014-05-12 VITALS — BP 112/74 | HR 50 | Temp 97.8°F | Ht 67.5 in | Wt 176.6 lb

## 2014-05-12 DIAGNOSIS — M25579 Pain in unspecified ankle and joints of unspecified foot: Secondary | ICD-10-CM

## 2014-05-12 DIAGNOSIS — Z0000 Encounter for general adult medical examination without abnormal findings: Secondary | ICD-10-CM | POA: Diagnosis not present

## 2014-05-12 DIAGNOSIS — Z23 Encounter for immunization: Secondary | ICD-10-CM | POA: Diagnosis not present

## 2014-05-12 LAB — POCT URINALYSIS DIPSTICK
Bilirubin, UA: NEGATIVE
Blood, UA: NEGATIVE
Glucose, UA: NEGATIVE
Ketones, UA: NEGATIVE
LEUKOCYTES UA: NEGATIVE
Nitrite, UA: NEGATIVE
PH UA: 6
PROTEIN UA: NEGATIVE
Spec Grav, UA: 1.015
UROBILINOGEN UA: 4

## 2014-05-12 NOTE — Patient Instructions (Signed)
Preventive Care for Adults A healthy lifestyle and preventive care can promote health and wellness. Preventive health guidelines for women include the following key practices.  A routine yearly physical is a good way to check with your health care provider about your health and preventive screening. It is a chance to share any concerns and updates on your health and to receive a thorough exam.  Visit your dentist for a routine exam and preventive care every 6 months. Brush your teeth twice a day and floss once a day. Good oral hygiene prevents tooth decay and gum disease.  The frequency of eye exams is based on your age, health, family medical history, use of contact lenses, and other factors. Follow your health care provider's recommendations for frequency of eye exams.  Eat a healthy diet. Foods like vegetables, fruits, whole grains, low-fat dairy products, and lean protein foods contain the nutrients you need without too many calories. Decrease your intake of foods high in solid fats, added sugars, and salt. Eat the right amount of calories for you.Get information about a proper diet from your health care provider, if necessary.  Regular physical exercise is one of the most important things you can do for your health. Most adults should get at least 150 minutes of moderate-intensity exercise (any activity that increases your heart rate and causes you to sweat) each week. In addition, most adults need muscle-strengthening exercises on 2 or more days a week.  Maintain a healthy weight. The body mass index (BMI) is a screening tool to identify possible weight problems. It provides an estimate of body fat based on height and weight. Your health care provider can find your BMI and can help you achieve or maintain a healthy weight.For adults 20 years and older:  A BMI below 18.5 is considered underweight.  A BMI of 18.5 to 24.9 is normal.  A BMI of 25 to 29.9 is considered overweight.  A BMI of  30 and above is considered obese.  Maintain normal blood lipids and cholesterol levels by exercising and minimizing your intake of saturated fat. Eat a balanced diet with plenty of fruit and vegetables. Blood tests for lipids and cholesterol should begin at age 53 and be repeated every 5 years. If your lipid or cholesterol levels are high, you are over 50, or you are at high risk for heart disease, you may need your cholesterol levels checked more frequently.Ongoing high lipid and cholesterol levels should be treated with medicines if diet and exercise are not working.  If you smoke, find out from your health care provider how to quit. If you do not use tobacco, do not start.  Lung cancer screening is recommended for adults aged 22-80 years who are at high risk for developing lung cancer because of a history of smoking. A yearly low-dose CT scan of the lungs is recommended for people who have at least a 30-pack-year history of smoking and are a current smoker or have quit within the past 15 years. A pack year of smoking is smoking an average of 1 pack of cigarettes a day for 1 year (for example: 1 pack a day for 30 years or 2 packs a day for 15 years). Yearly screening should continue until the smoker has stopped smoking for at least 15 years. Yearly screening should be stopped for people who develop a health problem that would prevent them from having lung cancer treatment.  If you are pregnant, do not drink alcohol. If you are breastfeeding,  be very cautious about drinking alcohol. If you are not pregnant and choose to drink alcohol, do not have more than 1 drink per day. One drink is considered to be 12 ounces (355 mL) of beer, 5 ounces (148 mL) of wine, or 1.5 ounces (44 mL) of liquor.  Avoid use of street drugs. Do not share needles with anyone. Ask for help if you need support or instructions about stopping the use of drugs.  High blood pressure causes heart disease and increases the risk of  stroke. Your blood pressure should be checked at least every 1 to 2 years. Ongoing high blood pressure should be treated with medicines if weight loss and exercise do not work.  If you are 11-53 years old, ask your health care provider if you should take aspirin to prevent strokes.  Diabetes screening involves taking a blood sample to check your fasting blood sugar level. This should be done once every 3 years, after age 53, if you are within normal weight and without risk factors for diabetes. Testing should be considered at a younger age or be carried out more frequently if you are overweight and have at least 1 risk factor for diabetes.  Breast cancer screening is essential preventive care for women. You should practice "breast self-awareness." This means understanding the normal appearance and feel of your breasts and may include breast self-examination. Any changes detected, no matter how small, should be reported to a health care provider. Women in their 53s and 30s should have a clinical breast exam (CBE) by a health care provider as part of a regular health exam every 1 to 3 years. After age 11, women should have a CBE every year. Starting at age 53, women should consider having a mammogram (breast X-ray test) every year. Women who have a family history of breast cancer should talk to their health care provider about genetic screening. Women at a high risk of breast cancer should talk to their health care providers about having an MRI and a mammogram every year.  Breast cancer gene (BRCA)-related cancer risk assessment is recommended for women who have family members with BRCA-related cancers. BRCA-related cancers include breast, ovarian, tubal, and peritoneal cancers. Having family members with these cancers may be associated with an increased risk for harmful changes (mutations) in the breast cancer genes BRCA1 and BRCA2. Results of the assessment will determine the need for genetic counseling and  BRCA1 and BRCA2 testing.  Routine pelvic exams to screen for cancer are no longer recommended for nonpregnant women who are considered low risk for cancer of the pelvic organs (ovaries, uterus, and vagina) and who do not have symptoms. Ask your health care provider if a screening pelvic exam is right for you.  If you have had past treatment for cervical cancer or a condition that could lead to cancer, you need Pap tests and screening for cancer for at least 20 years after your treatment. If Pap tests have been discontinued, your risk factors (such as having a new sexual partner) need to be reassessed to determine if screening should be resumed. Some women have medical problems that increase the chance of getting cervical cancer. In these cases, your health care provider may recommend more frequent screening and Pap tests.  The HPV test is an additional test that may be used for cervical cancer screening. The HPV test looks for the virus that can cause the cell changes on the cervix. The cells collected during the Pap test can be  tested for HPV. The HPV test could be used to screen women aged 30 years and older, and should be used in women of any age who have unclear Pap test results. After the age of 30, women should have HPV testing at the same frequency as a Pap test.  Colorectal cancer can be detected and often prevented. Most routine colorectal cancer screening begins at the age of 50 years and continues through age 75 years. However, your health care provider may recommend screening at an earlier age if you have risk factors for colon cancer. On a yearly basis, your health care provider may provide home test kits to check for hidden blood in the stool. Use of a small camera at the end of a tube, to directly examine the colon (sigmoidoscopy or colonoscopy), can detect the earliest forms of colorectal cancer. Talk to your health care provider about this at age 50, when routine screening begins. Direct  exam of the colon should be repeated every 5-10 years through age 75 years, unless early forms of pre-cancerous polyps or small growths are found.  People who are at an increased risk for hepatitis B should be screened for this virus. You are considered at high risk for hepatitis B if:  You were born in a country where hepatitis B occurs often. Talk with your health care provider about which countries are considered high risk.  Your parents were born in a high-risk country and you have not received a shot to protect against hepatitis B (hepatitis B vaccine).  You have HIV or AIDS.  You use needles to inject street drugs.  You live with, or have sex with, someone who has hepatitis B.  You get hemodialysis treatment.  You take certain medicines for conditions like cancer, organ transplantation, and autoimmune conditions.  Hepatitis C blood testing is recommended for all people born from 1945 through 1965 and any individual with known risks for hepatitis C.  Practice safe sex. Use condoms and avoid high-risk sexual practices to reduce the spread of sexually transmitted infections (STIs). STIs include gonorrhea, chlamydia, syphilis, trichomonas, herpes, HPV, and human immunodeficiency virus (HIV). Herpes, HIV, and HPV are viral illnesses that have no cure. They can result in disability, cancer, and death.  You should be screened for sexually transmitted illnesses (STIs) including gonorrhea and chlamydia if:  You are sexually active and are younger than 24 years.  You are older than 24 years and your health care provider tells you that you are at risk for this type of infection.  Your sexual activity has changed since you were last screened and you are at an increased risk for chlamydia or gonorrhea. Ask your health care provider if you are at risk.  If you are at risk of being infected with HIV, it is recommended that you take a prescription medicine daily to prevent HIV infection. This is  called preexposure prophylaxis (PrEP). You are considered at risk if:  You are a heterosexual woman, are sexually active, and are at increased risk for HIV infection.  You take drugs by injection.  You are sexually active with a partner who has HIV.  Talk with your health care provider about whether you are at high risk of being infected with HIV. If you choose to begin PrEP, you should first be tested for HIV. You should then be tested every 3 months for as long as you are taking PrEP.  Osteoporosis is a disease in which the bones lose minerals and strength   with aging. This can result in serious bone fractures or breaks. The risk of osteoporosis can be identified using a bone density scan. Women ages 65 years and over and women at risk for fractures or osteoporosis should discuss screening with their health care providers. Ask your health care provider whether you should take a calcium supplement or vitamin D to reduce the rate of osteoporosis.  Menopause can be associated with physical symptoms and risks. Hormone replacement therapy is available to decrease symptoms and risks. You should talk to your health care provider about whether hormone replacement therapy is right for you.  Use sunscreen. Apply sunscreen liberally and repeatedly throughout the day. You should seek shade when your shadow is shorter than you. Protect yourself by wearing long sleeves, pants, a wide-brimmed hat, and sunglasses year round, whenever you are outdoors.  Once a month, do a whole body skin exam, using a mirror to look at the skin on your back. Tell your health care provider of new moles, moles that have irregular borders, moles that are larger than a pencil eraser, or moles that have changed in shape or color.  Stay current with required vaccines (immunizations).  Influenza vaccine. All adults should be immunized every year.  Tetanus, diphtheria, and acellular pertussis (Td, Tdap) vaccine. Pregnant women should  receive 1 dose of Tdap vaccine during each pregnancy. The dose should be obtained regardless of the length of time since the last dose. Immunization is preferred during the 27th-36th week of gestation. An adult who has not previously received Tdap or who does not know her vaccine status should receive 1 dose of Tdap. This initial dose should be followed by tetanus and diphtheria toxoids (Td) booster doses every 10 years. Adults with an unknown or incomplete history of completing a 3-dose immunization series with Td-containing vaccines should begin or complete a primary immunization series including a Tdap dose. Adults should receive a Td booster every 10 years.  Varicella vaccine. An adult without evidence of immunity to varicella should receive 2 doses or a second dose if she has previously received 1 dose. Pregnant females who do not have evidence of immunity should receive the first dose after pregnancy. This first dose should be obtained before leaving the health care facility. The second dose should be obtained 4-8 weeks after the first dose.  Human papillomavirus (HPV) vaccine. Females aged 13-26 years who have not received the vaccine previously should obtain the 3-dose series. The vaccine is not recommended for use in pregnant females. However, pregnancy testing is not needed before receiving a dose. If a female is found to be pregnant after receiving a dose, no treatment is needed. In that case, the remaining doses should be delayed until after the pregnancy. Immunization is recommended for any person with an immunocompromised condition through the age of 26 years if she did not get any or all doses earlier. During the 3-dose series, the second dose should be obtained 4-8 weeks after the first dose. The third dose should be obtained 24 weeks after the first dose and 16 weeks after the second dose.  Zoster vaccine. One dose is recommended for adults aged 60 years or older unless certain conditions are  present.  Measles, mumps, and rubella (MMR) vaccine. Adults born before 1957 generally are considered immune to measles and mumps. Adults born in 1957 or later should have 1 or more doses of MMR vaccine unless there is a contraindication to the vaccine or there is laboratory evidence of immunity to   each of the three diseases. A routine second dose of MMR vaccine should be obtained at least 28 days after the first dose for students attending postsecondary schools, health care workers, or international travelers. People who received inactivated measles vaccine or an unknown type of measles vaccine during 1963-1967 should receive 2 doses of MMR vaccine. People who received inactivated mumps vaccine or an unknown type of mumps vaccine before 1979 and are at high risk for mumps infection should consider immunization with 2 doses of MMR vaccine. For females of childbearing age, rubella immunity should be determined. If there is no evidence of immunity, females who are not pregnant should be vaccinated. If there is no evidence of immunity, females who are pregnant should delay immunization until after pregnancy. Unvaccinated health care workers born before 1957 who lack laboratory evidence of measles, mumps, or rubella immunity or laboratory confirmation of disease should consider measles and mumps immunization with 2 doses of MMR vaccine or rubella immunization with 1 dose of MMR vaccine.  Pneumococcal 13-valent conjugate (PCV13) vaccine. When indicated, a person who is uncertain of her immunization history and has no record of immunization should receive the PCV13 vaccine. An adult aged 19 years or older who has certain medical conditions and has not been previously immunized should receive 1 dose of PCV13 vaccine. This PCV13 should be followed with a dose of pneumococcal polysaccharide (PPSV23) vaccine. The PPSV23 vaccine dose should be obtained at least 8 weeks after the dose of PCV13 vaccine. An adult aged 19  years or older who has certain medical conditions and previously received 1 or more doses of PPSV23 vaccine should receive 1 dose of PCV13. The PCV13 vaccine dose should be obtained 1 or more years after the last PPSV23 vaccine dose.  Pneumococcal polysaccharide (PPSV23) vaccine. When PCV13 is also indicated, PCV13 should be obtained first. All adults aged 65 years and older should be immunized. An adult younger than age 65 years who has certain medical conditions should be immunized. Any person who resides in a nursing home or long-term care facility should be immunized. An adult smoker should be immunized. People with an immunocompromised condition and certain other conditions should receive both PCV13 and PPSV23 vaccines. People with human immunodeficiency virus (HIV) infection should be immunized as soon as possible after diagnosis. Immunization during chemotherapy or radiation therapy should be avoided. Routine use of PPSV23 vaccine is not recommended for American Indians, [place] Natives, or people younger than 65 years unless there are medical conditions that require PPSV23 vaccine. When indicated, people who have unknown immunization and have no record of immunization should receive PPSV23 vaccine. One-time revaccination 5 years after the first dose of PPSV23 is recommended for people aged 19-64 years who have chronic kidney failure, nephrotic syndrome, asplenia, or immunocompromised conditions. People who received 1-2 doses of PPSV23 before age 65 years should receive another dose of PPSV23 vaccine at age 65 years or later if at least 5 years have passed since the previous dose. Doses of PPSV23 are not needed for people immunized with PPSV23 at or after age 65 years.  Meningococcal vaccine. Adults with asplenia or persistent complement component deficiencies should receive 2 doses of quadrivalent meningococcal conjugate (MenACWY-D) vaccine. The doses should be obtained at least 2 months apart.  Microbiologists working with certain meningococcal bacteria, military recruits, people at risk during an outbreak, and people who travel to or live in countries with a high rate of meningitis should be immunized. A first-year college student up through age   21 years who is living in a residence hall should receive a dose if she did not receive a dose on or after her 16th birthday. Adults who have certain high-risk conditions should receive one or more doses of vaccine.  Hepatitis A vaccine. Adults who wish to be protected from this disease, have certain high-risk conditions, work with hepatitis A-infected animals, work in hepatitis A research labs, or travel to or work in countries with a high rate of hepatitis A should be immunized. Adults who were previously unvaccinated and who anticipate close contact with an international adoptee during the first 60 days after arrival in the [place] from a country with a high rate of hepatitis A should be immunized.  Hepatitis B vaccine. Adults who wish to be protected from this disease, have certain high-risk conditions, may be exposed to blood or other infectious body fluids, are household contacts or sex partners of hepatitis B positive people, are clients or workers in certain care facilities, or travel to or work in countries with a high rate of hepatitis B should be immunized.  Haemophilus influenzae type b (Hib) vaccine. A previously unvaccinated person with asplenia or sickle cell disease or having a scheduled splenectomy should receive 1 dose of Hib vaccine. Regardless of previous immunization, a recipient of a hematopoietic stem cell transplant should receive a 3-dose series 6-12 months after her successful transplant. Hib vaccine is not recommended for adults with HIV infection. Preventive Services / Frequency Ages 59 to 105 years  Blood pressure check.** / Every 1 to 2 years.  Lipid and cholesterol check.** / Every 5 years beginning at age  50.  Clinical breast exam.** / Every 3 years for women in their 90s and 11s.  BRCA-related cancer risk assessment.** / For women who have family members with a BRCA-related cancer (breast, ovarian, tubal, or peritoneal cancers).  Pap test.** / Every 2 years from ages 9 through 37. Every 3 years starting at age 25 through age 6 or 71 with a history of 3 consecutive normal Pap tests.  HPV screening.** / Every 3 years from ages 61 through ages 53 to 65 with a history of 3 consecutive normal Pap tests.  Hepatitis C blood test.** / For any individual with known risks for hepatitis C.  Skin self-exam. / Monthly.  Influenza vaccine. / Every year.  Tetanus, diphtheria, and acellular pertussis (Tdap, Td) vaccine.** / Consult your health care provider. Pregnant women should receive 1 dose of Tdap vaccine during each pregnancy. 1 dose of Td every 10 years.  Varicella vaccine.** / Consult your health care provider. Pregnant females who do not have evidence of immunity should receive the first dose after pregnancy.  HPV vaccine. / 3 doses over 6 months, if 26 and younger. The vaccine is not recommended for use in pregnant females. However, pregnancy testing is not needed before receiving a dose.  Measles, mumps, rubella (MMR) vaccine.** / You need at least 1 dose of MMR if you were born in 1957 or later. You may also need a 2nd dose. For females of childbearing age, rubella immunity should be determined. If there is no evidence of immunity, females who are not pregnant should be vaccinated. If there is no evidence of immunity, females who are pregnant should delay immunization until after pregnancy.  Pneumococcal 13-valent conjugate (PCV13) vaccine.** / Consult your health care provider.  Pneumococcal polysaccharide (PPSV23) vaccine.** / 1 to 2 doses if you smoke cigarettes or if you have certain conditions.  Meningococcal vaccine.** /  1 dose if you are age 4 to 23 years and a [place] living in a residence hall, or have one of several medical conditions, you need to get vaccinated against meningococcal disease. You may also need additional booster doses.  Hepatitis A vaccine.** / Consult your health care provider.  Hepatitis B vaccine.** / Consult your health care provider.  Haemophilus influenzae type b (Hib) vaccine.** / Consult your health care provider. Ages 64 to 33 years  Blood pressure check.** / Every 1 to 2 years.  Lipid and cholesterol check.** / Every 5 years beginning at age 8 years.  Lung cancer screening. / Every year if you are aged 62-80 years and have a 30-pack-year history of smoking and currently smoke or have quit within the past 15 years. Yearly screening is stopped once you have quit smoking for at least 15 years or develop a health problem that would prevent you from having lung cancer treatment.  Clinical breast exam.** / Every year after age 11 years.  BRCA-related cancer risk assessment.** / For women who have family members with a BRCA-related cancer (breast, ovarian, tubal, or peritoneal cancers).  Mammogram.** / Every year beginning at age 103 years and continuing for as long as you are in good health. Consult with your health care provider.  Pap test.** / Every 3 years starting at age 72 years through age 44 or 26 years with a history of 3 consecutive normal Pap tests.  HPV screening.** / Every 3 years from ages 57 years through ages 70 to 47 years with a history of 3 consecutive normal Pap tests.  Fecal occult blood test (FOBT) of stool. / Every year beginning at age 43 years and continuing until age 36 years. You may not need to do this test if you get a colonoscopy every 10 years.  Flexible sigmoidoscopy or colonoscopy.** / Every 5 years for a flexible sigmoidoscopy or every 10 years for a colonoscopy beginning at age 88 years and continuing until age 35 years.  Hepatitis C blood test.** / For all people born from [birth year] through  1965 and any individual with known risks for hepatitis C.  Skin self-exam. / Monthly.  Influenza vaccine. / Every year.  Tetanus, diphtheria, and acellular pertussis (Tdap/Td) vaccine.** / Consult your health care provider. Pregnant women should receive 1 dose of Tdap vaccine during each pregnancy. 1 dose of Td every 10 years.  Varicella vaccine.** / Consult your health care provider. Pregnant females who do not have evidence of immunity should receive the first dose after pregnancy.  Zoster vaccine.** / 1 dose for adults aged 96 years or older.  Measles, mumps, rubella (MMR) vaccine.** / You need at least 1 dose of MMR if you were born in 1957 or later. You may also need a 2nd dose. For females of childbearing age, rubella immunity should be determined. If there is no evidence of immunity, females who are not pregnant should be vaccinated. If there is no evidence of immunity, females who are pregnant should delay immunization until after pregnancy.  Pneumococcal 13-valent conjugate (PCV13) vaccine.** / Consult your health care provider.  Pneumococcal polysaccharide (PPSV23) vaccine.** / 1 to 2 doses if you smoke cigarettes or if you have certain conditions.  Meningococcal vaccine.** / Consult your health care provider.  Hepatitis A vaccine.** / Consult your health care provider.  Hepatitis B vaccine.** / Consult your health care provider.  Haemophilus influenzae type b (Hib) vaccine.** / Consult your health care provider. Ages 36  years and over  Blood pressure check.** / Every 1 to 2 years.  Lipid and cholesterol check.** / Every 5 years beginning at age 82 years.  Lung cancer screening. / Every year if you are aged 37-80 years and have a 30-pack-year history of smoking and currently smoke or have quit within the past 15 years. Yearly screening is stopped once you have quit smoking for at least 15 years or develop a health problem that would prevent you from having lung cancer  treatment.  Clinical breast exam.** / Every year after age 39 years.  BRCA-related cancer risk assessment.** / For women who have family members with a BRCA-related cancer (breast, ovarian, tubal, or peritoneal cancers).  Mammogram.** / Every year beginning at age 50 years and continuing for as long as you are in good health. Consult with your health care provider.  Pap test.** / Every 3 years starting at age 59 years through age 35 or 50 years with 3 consecutive normal Pap tests. Testing can be stopped between 65 and 70 years with 3 consecutive normal Pap tests and no abnormal Pap or HPV tests in the past 10 years.  HPV screening.** / Every 3 years from ages 23 years through ages 31 or 84 years with a history of 3 consecutive normal Pap tests. Testing can be stopped between 65 and 70 years with 3 consecutive normal Pap tests and no abnormal Pap or HPV tests in the past 10 years.  Fecal occult blood test (FOBT) of stool. / Every year beginning at age 26 years and continuing until age 44 years. You may not need to do this test if you get a colonoscopy every 10 years.  Flexible sigmoidoscopy or colonoscopy.** / Every 5 years for a flexible sigmoidoscopy or every 10 years for a colonoscopy beginning at age 59 years and continuing until age 40 years.  Hepatitis C blood test.** / For all people born from [birth year] through 1964 and any individual with known risks for hepatitis C.  Osteoporosis screening.** / A one-time screening for women ages 38 years and over and women at risk for fractures or osteoporosis.  Skin self-exam. / Monthly.  Influenza vaccine. / Every year.  Tetanus, diphtheria, and acellular pertussis (Tdap/Td) vaccine.** / 1 dose of Td every 10 years.  Varicella vaccine.** / Consult your health care provider.  Zoster vaccine.** / 1 dose for adults aged 70 years or older.  Pneumococcal 13-valent conjugate (PCV13) vaccine.** / Consult your health care provider.  Pneumococcal  polysaccharide (PPSV23) vaccine.** / 1 dose for all adults aged 61 years and older.  Meningococcal vaccine.** / Consult your health care provider.  Hepatitis A vaccine.** / Consult your health care provider.  Hepatitis B vaccine.** / Consult your health care provider.  Haemophilus influenzae type b (Hib) vaccine.** / Consult your health care provider. ** Family history and personal history of risk and conditions may change your health care provider's recommendations. Document Released: 02/19/2001 Document Revised: 05/10/2013 Document Reviewed: 05/21/2010 [facility] Patient Information 2015 [place], [place]. This information is not intended to replace advice given to you by your health care provider. Make sure you discuss any questions you have with your health care provider.

## 2014-05-12 NOTE — Progress Notes (Signed)
Subjective:     Jeanette Boone is a 54 y.o. female and is here for a comprehensive physical exam. The patient reports --  Abnormality in foot that started when she started wearing certain shoes.  --- she   History   Social History  . Marital Status: Married    Spouse Name: donald  . Number of Children: 0  . Years of Education: college   Occupational History  . [employer].    Social History Main Topics  . Smoking status: Former Research scientist (life sciences)  . Smokeless tobacco: Never Used  . Alcohol Use: Yes     Comment: 1 or 2 per week  . Drug Use: Yes    Special: Methaqualone  . Sexual Activity:    Partners: Male   Other Topics Concern  . Not on file   Social History Narrative   Exercise-- no      Patient is right handed.   Patient does not drink caffeine.   Health Maintenance  Topic Date Due  . HIV Screening  05/11/2015 (Originally 04/24/1976)  . INFLUENZA VACCINE  08/08/2014  . MAMMOGRAM  07/21/2015  . PAP SMEAR  11/12/2015  . TETANUS/TDAP  02/02/2018  . 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 She  does not have any pertinent problems on file. She  has past surgical history that includes Hernia repair (1995); Hernia repair (1996); Cholecystectomy (05/2008); uterine polyp removal (06/2008); Colonoscopy (08/2009); port a cath insertion (06/2008); Breast surgery (05/2008); and laparoscopy. Her family history includes Heart disease in her father; Heart disease (age of onset: 61) in her maternal grandfather; Heart disease (age of onset: 68) in her paternal grandfather; Hypertension in her mother; Stroke in her maternal grandfather and maternal grandmother. She  reports that she has quit smoking. She has never used smokeless tobacco. She reports that  she drinks alcohol. 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 She has a current medication list which includes the following prescription(s): b complex vitamins, biotin, cholecalciferol, duloxetine, gabapentin, multivitamin, utopic, and valacyclovir. Current Outpatient Prescriptions on File Prior to Visit  Medication Sig Dispense Refill  . B Complex Vitamins (B-COMPLEX/B-12 PO) Take by mouth.    . Biotin 1000 MCG tablet Take 1,000 mcg by mouth daily.     . cholecalciferol (VITAMIN D) 1000 UNITS tablet Take 2,000 Units by mouth daily.    . DULoxetine (CYMBALTA) 30 MG capsule Take 1 capsule (30 mg total) by mouth daily. 30 capsule 11  . gabapentin (NEURONTIN) 300 MG capsule Take 3 capsules (900 mg total) by mouth 4 (four) times daily. 360 capsule 11  . Multiple Vitamin (MULTIVITAMIN) tablet Take 1 tablet by mouth daily.    Marland Kitchen UTOPIC 41 % CREA     . valACYclovir (VALTREX) 500 MG tablet Take 500 mg by mouth daily.      No current facility-administered medications on file prior to visit.   She is allergic to clarithromycin and penicillins..  Review of Systems Review of Systems  Constitutional: Negative for activity change, appetite change and fatigue.  HENT: Negative for hearing loss, congestion, tinnitus and ear discharge.  dentist q72m Eyes: Negative for visual disturbance (see optho q1y -- vision corrected to 20/20 with glasses).  Respiratory: Negative for cough, chest tightness and shortness of breath.   Cardiovascular: Negative for chest  pain, palpitations and leg swelling.  Gastrointestinal: Negative for abdominal pain, diarrhea, constipation and abdominal distention.  Genitourinary: Negative for urgency, frequency, decreased urine volume and difficulty urinating.  Musculoskeletal: Negative for back pain, arthralgias and gait problem.  Skin: Negative for color change, pallor and rash.  Neurological: Negative for dizziness, light-headedness, numbness and headaches.   Hematological: Negative for adenopathy. Does not bruise/bleed easily.  Psychiatric/Behavioral: Negative for suicidal ideas, confusion, sleep disturbance, self-injury, dysphoric mood, decreased concentration and agitation.       Objective:    BP 112/74 mmHg  Pulse 50  Temp(Src) 97.8 F (36.6 C) (Oral)  Ht 5' 7.5" (1.715 m)  Wt 176 lb 9.6 oz (80.105 kg)  BMI 27.24 kg/m2  SpO2 99% General appearance: alert, cooperative, appears stated age and no distress Head: Normocephalic, without obvious abnormality, atraumatic Eyes: conjunctivae/corneas clear. PERRL, EOM's intact. Fundi benign. Ears: normal TM's and external ear canals both ears Nose: Nares normal. Septum midline. Mucosa normal. No drainage or sinus tenderness. Throat: lips, mucosa, and tongue normal; teeth and gums normal Neck: no adenopathy, no carotid bruit, no JVD, supple, symmetrical, trachea midline and thyroid not enlarged, symmetric, no tenderness/mass/nodules Back: symmetric, no curvature. ROM normal. No CVA tenderness. Lungs: clear to auscultation bilaterally Breasts: normal appearance, no masses or tenderness Heart: S1, S2 normal Abdomen: soft, non-tender; bowel sounds normal; no masses,  no organomegaly Pelvic: deferred Extremities: extremities normal, atraumatic, no cyanosis or edema Pulses: 2+ and symmetric Skin: Skin color, texture, turgor normal. No rashes or lesions Lymph nodes: Cervical, supraclavicular, and axillary nodes normal. Neurologic: Alert and oriented X 3, normal strength and tone. Normal symmetric reflexes. Normal coordination and gait Psych-- no depression, no anxiety      Assessment:    Healthy female exam.       Plan:     ghm utd Check labs See After Visit Summary for Counseling Recommendations    1. Preventative health care   - Lipid Profile - CBC with Differential - Basic Metabolic Panel (BMET) - TSH - Hepatic function panel - POCT Urinalysis Dipstick  2. Pain in joint,  ankle and foot, unspecified laterality   - Ambulatory referral to Orthopedic Surgery  3. Need for prophylactic vaccination against Streptococcus pneumoniae (pneumococcus)    - Pneumococcal polysaccharide vaccine 23-valent greater than or equal to 2yo subcutaneous/IM

## 2014-05-12 NOTE — Progress Notes (Signed)
Pre visit review using our clinic review tool, if applicable. No additional management support is needed unless otherwise documented below in the visit note. 

## 2014-05-13 LAB — HEPATIC FUNCTION PANEL
ALBUMIN: 4 g/dL (ref 3.5–5.2)
ALT: 13 U/L (ref 0–35)
AST: 20 U/L (ref 0–37)
Alkaline Phosphatase: 70 U/L (ref 39–117)
Bilirubin, Direct: 0.1 mg/dL (ref 0.0–0.3)
Total Bilirubin: 0.6 mg/dL (ref 0.2–1.2)
Total Protein: 6.8 g/dL (ref 6.0–8.3)

## 2014-05-13 LAB — CBC WITH DIFFERENTIAL/PLATELET
Basophils Absolute: 0.1 10*3/uL (ref 0.0–0.1)
Basophils Relative: 2.1 % (ref 0.0–3.0)
EOS ABS: 0.1 10*3/uL (ref 0.0–0.7)
Eosinophils Relative: 2.3 % (ref 0.0–5.0)
HCT: 40 % (ref 36.0–46.0)
HEMOGLOBIN: 13.5 g/dL (ref 12.0–15.0)
Lymphocytes Relative: 39 % (ref 12.0–46.0)
Lymphs Abs: 1.7 10*3/uL (ref 0.7–4.0)
MCHC: 33.8 g/dL (ref 30.0–36.0)
MCV: 94.2 fl (ref 78.0–100.0)
Monocytes Absolute: 0.3 10*3/uL (ref 0.1–1.0)
Monocytes Relative: 6.1 % (ref 3.0–12.0)
NEUTROS ABS: 2.3 10*3/uL (ref 1.4–7.7)
NEUTROS PCT: 50.5 % (ref 43.0–77.0)
PLATELETS: 152 10*3/uL (ref 150.0–400.0)
RBC: 4.25 Mil/uL (ref 3.87–5.11)
RDW: 13.2 % (ref 11.5–15.5)
WBC: 4.5 10*3/uL (ref 4.0–10.5)

## 2014-05-13 LAB — TSH: TSH: 1.71 u[IU]/mL (ref 0.35–4.50)

## 2014-05-13 LAB — BASIC METABOLIC PANEL
BUN: 9 mg/dL (ref 6–23)
CALCIUM: 9.9 mg/dL (ref 8.4–10.5)
CO2: 26 meq/L (ref 19–32)
CREATININE: 0.87 mg/dL (ref 0.40–1.20)
Chloride: 105 mEq/L (ref 96–112)
GFR: 72.38 mL/min (ref >60.00)
GLUCOSE: 72 mg/dL (ref 70–99)
Potassium: 4.2 mEq/L (ref 3.5–5.1)
Sodium: 141 mEq/L (ref 135–145)

## 2014-05-13 LAB — LIPID PANEL
CHOL/HDL RATIO: 3
CHOLESTEROL: 179 mg/dL (ref 0–200)
HDL: 66.3 mg/dL (ref >39.00)
LDL CALC: 102 mg/dL — AB (ref 0–99)
NonHDL: 112.7
Triglycerides: 55 mg/dL (ref 0.0–149.0)
VLDL: 11 mg/dL (ref 0.0–40.0)

## 2014-05-23 ENCOUNTER — Telehealth: Payer: Self-pay | Admitting: Oncology

## 2014-05-23 NOTE — Telephone Encounter (Signed)
Returned Advertising account executive.Marland Kitchen Confirmed appointment for 05/23 lab only.

## 2014-05-26 ENCOUNTER — Other Ambulatory Visit: Payer: Managed Care, Other (non HMO)

## 2014-05-30 ENCOUNTER — Other Ambulatory Visit (HOSPITAL_BASED_OUTPATIENT_CLINIC_OR_DEPARTMENT_OTHER): Payer: Managed Care, Other (non HMO)

## 2014-05-30 DIAGNOSIS — Z853 Personal history of malignant neoplasm of breast: Secondary | ICD-10-CM

## 2014-05-30 DIAGNOSIS — C50211 Malignant neoplasm of upper-inner quadrant of right female breast: Secondary | ICD-10-CM

## 2014-05-30 LAB — COMPREHENSIVE METABOLIC PANEL (CC13)
ALBUMIN: 3.8 g/dL (ref 3.5–5.0)
ALK PHOS: 83 U/L (ref 40–150)
ALT: 15 U/L (ref 0–55)
ANION GAP: 10 meq/L (ref 3–11)
AST: 21 U/L (ref 5–34)
BUN: 11.4 mg/dL (ref 7.0–26.0)
CALCIUM: 8.5 mg/dL (ref 8.4–10.4)
CHLORIDE: 104 meq/L (ref 98–109)
CO2: 26 mEq/L (ref 22–29)
CREATININE: 1 mg/dL (ref 0.6–1.1)
EGFR: 63 mL/min/{1.73_m2} — AB (ref >90)
Glucose: 92 mg/dl (ref 70–140)
Potassium: 4 mEq/L (ref 3.5–5.1)
SODIUM: 140 meq/L (ref 136–145)
Total Bilirubin: 0.42 mg/dL (ref 0.20–1.20)
Total Protein: 6.5 g/dL (ref 6.4–8.3)

## 2014-05-30 LAB — CBC WITH DIFFERENTIAL/PLATELET
BASO%: 0.8 % (ref 0.0–2.0)
Basophils Absolute: 0 10*3/uL (ref 0.0–0.1)
EOS ABS: 0.2 10*3/uL (ref 0.0–0.5)
EOS%: 3.6 % (ref 0.0–7.0)
HCT: 40.1 % (ref 34.8–46.6)
HGB: 13.4 g/dL (ref 11.6–15.9)
LYMPH%: 33.2 % (ref 14.0–49.7)
MCH: 32 pg (ref 25.1–34.0)
MCHC: 33.3 g/dL (ref 31.5–36.0)
MCV: 96 fL (ref 79.5–101.0)
MONO#: 0.3 10*3/uL (ref 0.1–0.9)
MONO%: 5.9 % (ref 0.0–14.0)
NEUT#: 3 10*3/uL (ref 1.5–6.5)
NEUT%: 56.5 % (ref 38.4–76.8)
PLATELETS: 156 10*3/uL (ref 145–400)
RBC: 4.18 10*6/uL (ref 3.70–5.45)
RDW: 12.6 % (ref 11.2–14.5)
WBC: 5.4 10*3/uL (ref 3.9–10.3)
lymph#: 1.8 10*3/uL (ref 0.9–3.3)

## 2014-06-02 ENCOUNTER — Telehealth: Payer: Self-pay | Admitting: Oncology

## 2014-06-02 ENCOUNTER — Ambulatory Visit (HOSPITAL_BASED_OUTPATIENT_CLINIC_OR_DEPARTMENT_OTHER): Payer: Managed Care, Other (non HMO) | Admitting: Oncology

## 2014-06-02 VITALS — BP 109/75 | HR 50 | Temp 98.0°F | Resp 18 | Ht 67.5 in | Wt 171.0 lb

## 2014-06-02 DIAGNOSIS — Z853 Personal history of malignant neoplasm of breast: Secondary | ICD-10-CM

## 2014-06-02 DIAGNOSIS — G629 Polyneuropathy, unspecified: Secondary | ICD-10-CM

## 2014-06-02 DIAGNOSIS — C50211 Malignant neoplasm of upper-inner quadrant of right female breast: Secondary | ICD-10-CM

## 2014-06-02 DIAGNOSIS — G549 Nerve root and plexus disorder, unspecified: Secondary | ICD-10-CM

## 2014-06-02 NOTE — Progress Notes (Signed)
ID: Floydene Flock   DOB: 1960-04-02  MR#: [id number]  TTS#:[id number]  PCP: Garnet Koyanagi MD GYN: Princess Bruins MD SU: Fanny Skates MD OTHER MD: Morene Antu, Gery Pray, Jari Pigg, Lenor Coffin  CHIEF COMPLAINT: HER-2 positive breast cancer  CURRENT TREATMENT: Observation   BREAST CANCER HISTORY:  From the original intake note:  She herself palpated a mass in her right breast in 04/2008.  The patient tells me that she had lost approximately 100 pounds by diet and exercise over the past year, and that this made her breasts lumpier feeling.  In any case, she brought this question to Dr. Truddie Hidden Lavoie's attention, and was set up for mammography at [facility] on 04/21/2008.  She had had a mammogram in 09/2007, which had been unremarkable.  The mammogram on 05/01/2008 showed a dense right breast with a suggestion of a possible mass in the upper central breast corresponding to the palpable abnormality.  Ultrasound was more revealing, and did show an irregularly bordered hypoechoic solid mass measuring 1.2 cm.  The patient was brought back for biopsy and clip placement on 04/23, and the pathology from that procedure ([id number] and [id number]) showed an invasive ductal carcinoma which was negative for the estrogen and progesterone receptor at 0 and 0 respectively.  The Ki-67 was 19%, and CISH was amplified with a ratio of 2.22 (this is just above the "indeterminate" range).  Bilateral breast MRIs were obtained on 04/30.  This showed in the right breast an enhancing rounded mass measuring up to 1.8 cm.  The left breast was unremarkable, and there were no enlarged or suspicious internal mammary or axillary nodes on either side.  With this information, the patient proceeded to definitive right lumpectomy and sentinel lymph node sampling on 05/16/2008.  The final pathology ([id number]) confirmed a 1.5 cm invasive ductal carcinoma, grade 2, with negative margins, no evidence of lymphovascular  invasion, and 0 of 3 sentinel lymph nodes involved.  The estrogen receptor test was repeated ([id number]), and was again negative with a strong positive control.  Her subsequent history is as detailed below.   INTERVAL HISTORY:  Fionna returns for followup of her right breast cancer. She is now 6 years out from her definitive surgery. 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 There are now 13 and 10. She walks 3-4 miles 3-4 times a week.  REVIEW OF SYSTEMS:  Schrier has the neuropathy which she describes as a burning feeling more on the dorsal than the ventral side of the arms but really all over the body, with some pulsing pains down the legs at times, and with tingling numbness peripherally. Nothing seems to have helped this despite multiple evaluations and treatment attempts. She is also worried because she is lost some weight, but then she is trying to lose weight. Her goal is to get back to her pretreatment baseline. A detailed review of systems today was otherwise stable. Marland Kitchen  PAST MEDICAL HISTORY: Past Medical History  Diagnosis Date  . Cancer     breast  . Chronic leg pain   . Paresthesias     Generalized  . Thrombocytopenia, secondary     secondary to valtrex  . 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 Obesity   . Endometriosis   1. Obesity.  As stated, the patient has been on a very strict 1200-1400 calorie diet and 5 days of exercise for over a year, and tells me she has lost over 100 pounds.  She tells  me she has done this in the past, and that she loses weight easily, but also gains weight easily.  She is now a bit below where she would like to be, her goal being approximately 140 pounds. 2. She had an episode of cholecystitis, and underwent cholecystectomy on 06/01/2008 under Dr. Dalbert Batman.  The pathology there ([id number]) showed only chronic cholecystitis and cholelithiasis with a benign cystic duct lymph node. 3. The patient has a history of  endometriosis. 4. Status post umbilical hernia repair. 5. The patient tried for many years to get pregnant and took "large doses of fertility drugs" for about 8 or 9 years before giving up on those attempts.       6.   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          7.  History of poorly understood neurologic symptom of  hyperesthesia, controlled on neurontin    PAST SURGICAL HISTORY: S/p cholecystectomy  FAMILY HISTORY The patient's father died at the age of 68 from causes not quite clear to the patient, but he certainly had significant heart disease.  The patient's mother is alive.  She has a history of bladder cancer.  Both the patient's parents were single children.  The patient has one sister and two brothers.  There is no history of breast cancer in the immediate family.  GYNECOLOGIC HISTORY: She is GX, P0. She was having regular periods at the time of her cancer diagnosis, these were interrupted by chemotherapy, then resumed. Her periods are now irregular. She is having mild hot flashes.  SOCIAL HISTORY: Dortha works as a Paramedic.  Her husband, [name], is an Chief Financial Officer.  Their two children, [name] and [name],  were adopted from [place].  The patient attends the [employer].    ADVANCED DIRECTIVES Not in place    HEALTH MAINTENANCE: History  Substance Use Topics  . Smoking status: Former Research scientist (life sciences)  . Smokeless tobacco: Never Used  . Alcohol Use: Yes     Comment: 1 or 2 per week     Colonoscopy: 2011  PAP: UTD  Bone density:  Lipid panel:  Allergies  Allergen Reactions  . Clarithromycin     Thrush  . Penicillins Other (See Comments)    Child hood     Current Outpatient Prescriptions  Medication Sig Dispense Refill  . B Complex Vitamins (B-COMPLEX/B-12 PO) Take by mouth.    . Biotin 1000 MCG tablet Take 1,000 mcg by mouth daily.     . cholecalciferol (VITAMIN D) 1000 UNITS tablet Take 2,000 Units by mouth  daily.    . DULoxetine (CYMBALTA) 30 MG capsule Take 1 capsule (30 mg total) by mouth daily. 30 capsule 11  . gabapentin (NEURONTIN) 300 MG capsule Take 3 capsules (900 mg total) by mouth 4 (four) times daily. 360 capsule 11  . Multiple Vitamin (MULTIVITAMIN) tablet Take 1 tablet by mouth daily.    Marland Kitchen UTOPIC 41 % CREA     . valACYclovir (VALTREX) 500 MG tablet Take 500 mg by mouth daily.      No current facility-administered medications for this visit.    OBJECTIVE: Middle-aged white woman in no acute distress  Filed Vitals:   06/02/14 1327  BP: 109/75  Pulse: 50  Temp: 98 F (36.7 C)  Resp: 18     Body mass index is 26.37 kg/(m^2).    ECOG FS: 1 Filed Weights   06/02/14 1327  Weight: 171 lb (77.565  kg)   Sclerae unicteric, pupils round and equal Oropharynx clear and moist-- no thrush or other lesions No cervical or supraclavicular adenopathy Lungs no rales or rhonchi Heart regular rate and rhythm Abd soft, nontender, positive bowel sounds MSK no focal spinal tenderness, no upper extremity lymphedema Neuro: nonfocal, well oriented, appropriate affect Breasts: The right breast is status post lumpectomy and radiation. It is slightly smaller than the left. There is no evidence of local recurrence. The right axilla is benign per the left breast is unremarkable    LAB RESULTS: Lab Results  Component Value Date   WBC 5.4 05/30/2014   NEUTROABS 3.0 05/30/2014   HGB 13.4 05/30/2014   HCT 40.1 05/30/2014   MCV 96.0 05/30/2014   PLT 156 05/30/2014      Chemistry      Component Value Date/Time   NA 140 05/30/2014 1507   NA 141 05/12/2014 1426   K 4.0 05/30/2014 1507   K 4.2 05/12/2014 1426   CL 105 05/12/2014 1426   CL 105 03/26/2012 1010   CO2 26 05/30/2014 1507   CO2 26 05/12/2014 1426   BUN 11.4 05/30/2014 1507   BUN 9 05/12/2014 1426   CREATININE 1.0 05/30/2014 1507   CREATININE 0.87 05/12/2014 1426      Component Value Date/Time   CALCIUM 8.5 05/30/2014 1507    CALCIUM 9.9 05/12/2014 1426   ALKPHOS 83 05/30/2014 1507   ALKPHOS 70 05/12/2014 1426   AST 21 05/30/2014 1507   AST 20 05/12/2014 1426   ALT 15 05/30/2014 1507   ALT 13 05/12/2014 1426   BILITOT 0.42 05/30/2014 1507   BILITOT 0.6 05/12/2014 1426        STUDIES: No results found.   ASSESSMENT: 54 y.o.  BRCA negative [place] woman  (1) status post right lumpectomy and sentinel lymph node dissection May 2010 for a T1c N0, stage 1A invasive ductal carcinoma,  grade 2,  estrogen and progesterone receptor negative, but HER-2/neu positive.  The MIB-1 was 19%.   (2) received 4 cycles of docetaxel/carboplatin/trastuzumab adjuvantly  (3) she continued Herceptin to July 2010 with echocardiogram showing a well-preserved ejection fraction after the completion of treatment.  (4) she completed radiation in December 2010.   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 This is very favorable.  It is unfortunate that she continues to have her neuropathy symptoms. She has tried multiple treatments and evaluations and they have not resolve the issue. I suggested she could try some dietary manipulations. I don't think he would hurt if she dropped all gluten for 3 months perhaps and see if that makes a difference.  Otherwise she will will continue to see me on a once a year basis until she completes 10 years of follow-up. Since she gets her mammograms in July, her next visit here will be August 2017.  She knows to call for any problems that may develop before that visit.Marland Kitchen MAGRINAT,GUSTAV C    06/02/2014

## 2014-06-02 NOTE — Telephone Encounter (Signed)
Gave avs. Will call patient once August 2017 is able to schedule.

## 2014-07-06 ENCOUNTER — Encounter: Payer: Self-pay | Admitting: Genetic Counselor

## 2014-07-19 ENCOUNTER — Other Ambulatory Visit: Payer: Self-pay | Admitting: Oncology

## 2014-07-19 DIAGNOSIS — R922 Inconclusive mammogram: Secondary | ICD-10-CM

## 2014-08-23 ENCOUNTER — Encounter: Payer: Self-pay | Admitting: Internal Medicine

## 2014-09-07 ENCOUNTER — Ambulatory Visit
Admission: RE | Admit: 2014-09-07 | Discharge: 2014-09-07 | Disposition: A | Discharge status: Home / Self Care | Payer: Managed Care, Other (non HMO) | Source: Ambulatory Visit | Attending: Oncology | Admitting: Oncology

## 2014-09-07 DIAGNOSIS — R922 Inconclusive mammogram: Secondary | ICD-10-CM

## 2014-09-14 ENCOUNTER — Ambulatory Visit
Admission: RE | Admit: 2014-09-14 | Discharge: 2014-09-14 | Disposition: A | Discharge status: Home / Self Care | Payer: Managed Care, Other (non HMO) | Source: Ambulatory Visit | Attending: Oncology | Admitting: Oncology

## 2014-11-02 NOTE — Telephone Encounter (Signed)
Error

## 2014-11-10 ENCOUNTER — Ambulatory Visit: Payer: 59 | Admitting: Neurology

## 2014-11-15 ENCOUNTER — Ambulatory Visit (INDEPENDENT_AMBULATORY_CARE_PROVIDER_SITE_OTHER): Payer: 59 | Admitting: Neurology

## 2014-11-15 ENCOUNTER — Encounter: Payer: Self-pay | Admitting: Neurology

## 2014-11-15 VITALS — BP 101/63 | HR 49 | Ht 67.5 in | Wt 142.5 lb

## 2014-11-15 DIAGNOSIS — G549 Nerve root and plexus disorder, unspecified: Secondary | ICD-10-CM | POA: Diagnosis not present

## 2014-11-15 MED ORDER — DULOXETINE HCL 30 MG PO CPEP: 30.0000 mg | ORAL_CAPSULE | Freq: Every day | ORAL | Status: DC

## 2014-11-15 MED ORDER — GABAPENTIN 300 MG PO CAPS: 900.0000 mg | ORAL_CAPSULE | Freq: Four times a day (QID) | ORAL | Status: DC

## 2014-11-15 NOTE — Progress Notes (Signed)
Reason for visit: Sensory dysesthesias  Jeanette Boone is an 54 y.o. 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 The patient claimed that the left side is worse than the right. The patient has burning sensations throughout. She is on gabapentin taking 900 mg 4 times daily, and Cymbalta 30 mg daily. The patient believes that these medications do help. She has some mild gait instability and mild clouding of cognitive functioning on the gabapentin. The patient denies any significant changes in her clinical condition since last seen. She does report some left-sided tinnitus with decreased hearing. She returns for an evaluation. She has a history of breast cancer diagnosed in 2010, her sensory symptoms began in 2009. Workup for paraneoplastic syndromes were negative previously.  Past Medical History  Diagnosis Date  . Cancer (HCC)     breast  . Chronic leg pain   . Paresthesias     Generalized  . Thrombocytopenia, secondary     secondary to valtrex  . 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 Obesity   . Endometriosis     Past Surgical History  Procedure Laterality Date  . Hernia repair  8546    umbilical  . Hernia repair  1996  . Cholecystectomy  05/2008  . Uterine polyp removal  06/2008  . Colonoscopy  08/2009  . Port a cath insertion  06/2008  . Breast surgery  05/2008    lumpectomy - right  . Laparoscopy      Family History  Problem Relation Age of Onset  . Heart disease Father     pacemaker----tachycardia  . Stroke Maternal Grandmother   . Heart disease Maternal Grandfather 32    MI  . Stroke Maternal Grandfather   . Heart disease Paternal Grandfather 9    MI  . Hypertension Mother     Social history:  reports that she has quit smoking. She has never used smokeless tobacco. She reports that she drinks alcohol. She reports that she uses  illicit drugs (Methaqualone).    Allergies  Allergen Reactions  . Clarithromycin     Thrush  . Penicillins Other (See Comments)    Child hood     Medications:  Prior to Admission medications   Medication Sig Start Date End Date Taking? Authorizing Provider  B Complex Vitamins (B-COMPLEX/B-12 PO) Take by mouth.   Yes Historical Provider, MD  Biotin 1000 MCG tablet Take 1,000 mcg by mouth daily.    Yes Historical Provider, MD  cholecalciferol (VITAMIN D) 1000 UNITS tablet Take 2,000 Units by mouth daily.   Yes Historical Provider, MD  DULoxetine (CYMBALTA) 30 MG capsule Take 1 capsule (30 mg total) by mouth daily. 11/09/13  Yes Kathrynn Ducking, MD  gabapentin (NEURONTIN) 300 MG capsule Take 3 capsules (900 mg total) by mouth 4 (four) times daily. 11/09/13  Yes Kathrynn Ducking, MD  Multiple Vitamin (MULTIVITAMIN) tablet Take 1 tablet by mouth daily.   Yes Historical Provider, MD  valACYclovir (VALTREX) 500 MG tablet Take 500 mg by mouth daily.    Yes Historical Provider, MD    ROS:  Out of a complete 14 system review of symptoms, the patient complains only of the following symptoms, and all other reviewed systems are negative.  Hearing loss, ringing in the ears Achy muscles, muscle cramps Tingling sensations, numbness, burning  Blood pressure 101/63, pulse 49,  height 5' 7.5" (1.715 m), weight 142 lb 8 oz (64.638 kg).  Physical Exam  General: The patient is alert and cooperative at the time of the examination.  Skin: No significant peripheral edema is noted.   Neurologic Exam  Mental status: The patient is alert and oriented x 3 at the time of the examination. The patient has apparent normal recent and remote memory, with an apparently normal attention span and concentration ability.   Cranial nerves: Facial symmetry is present. Speech is normal, no aphasia or dysarthria is noted. Extraocular movements are full. Visual fields are full.  Motor: The patient has good strength  in all 4 extremities.  Sensory examination: Soft touch sensation is symmetric on the face, arms, and legs.  Coordination: The patient has good finger-nose-finger and heel-to-shin bilaterally.  Gait and station: The patient has a normal gait. Tandem gait is normal. Romberg is negative. No drift is seen.  Reflexes: Deep tendon reflexes are symmetric.   Assessment/Plan:  1. Sensory dysesthesias, all 4 extremities  2. History of breast cancer  The patient has had elevation in the rheumatoid factor previously, but a rheumatologic evaluation revealed that the rheumatoid factor was likely of no clinical significance. The patient will continue the gabapentin and Cymbalta, these prescriptions were called into the pharmacy. She will follow-up in one year, sooner if needed.  Jill Alexanders MD 11/15/2014 8:28 AM  [facility] [address] [place], [place] [address]  Phone [phone] Fax [phone]

## 2014-11-15 NOTE — Patient Instructions (Signed)
Neuropathic Pain Neuropathic pain is pain caused by damage to the nerves that are responsible for certain sensations in your body (sensory nerves). The pain can be caused by damage to:   The sensory nerves that send signals to your spinal cord and brain (peripheral nervous system).  The sensory nerves in your brain or spinal cord (central nervous system). Neuropathic pain can make you more sensitive to pain. What would be a minor sensation for most people may feel very painful if you have neuropathic pain. This is usually a long-term condition that can be difficult to treat. The type of pain can differ from person to person. It may start suddenly (acute), or it may develop slowly and last for a long time (chronic). Neuropathic pain may come and go as damaged nerves heal or may stay at the same level for years. It often causes emotional distress, loss of sleep, and a lower quality of life. CAUSES  The most common cause of damage to a sensory nerve is diabetes. Many other diseases and conditions can also cause neuropathic pain. Causes of neuropathic pain can be classified as:  Toxic. Many drugs and chemicals can cause toxic damage. The most common cause of toxic neuropathic pain is damage from drug treatment for cancer (chemotherapy).  Metabolic. This type of pain can happen when a disease causes imbalances that damage nerves. Diabetes is the most common of these diseases. Vitamin B deficiency caused by long-term alcohol abuse is another common cause.  Traumatic. Any injury that cuts, crushes, or stretches a nerve can cause damage and pain. A common example is feeling pain after losing an arm or leg (phantom limb pain).  Compression-related. If a sensory nerve gets trapped or compressed for a long period of time, the blood supply to the nerve can be cut off.  Vascular. Many blood vessel diseases can cause neuropathic pain by decreasing blood supply and oxygen to nerves.  Autoimmune. This type of  pain results from diseases in which the body's defense system mistakenly attacks sensory nerves. Examples of autoimmune diseases that can cause neuropathic pain include lupus and multiple sclerosis.  Infectious. Many types of viral infections can damage sensory nerves and cause pain. Shingles infection is a common cause of this type of pain.  Inherited. Neuropathic pain can be a symptom of many diseases that are passed down through families (genetic). SIGNS AND SYMPTOMS  The main symptom is pain. Neuropathic pain is often described as:  Burning.  Shock-like.  Stinging.  Hot or cold.  Itching. DIAGNOSIS  No single test can diagnose neuropathic pain. Your health care provider will do a physical exam and ask you about your pain. You may use a pain scale to describe how bad your pain is. You may also have tests to see if you have a high sensitivity to pain and to help find the cause and location of any sensory nerve damage. These tests may include:  Imaging studies, such as:  X-rays.  CT scan.  MRI.  Nerve conduction studies to test how well nerve signals travel through your sensory nerves (electrodiagnostic testing).  Stimulating your sensory nerves through electrodes on your skin and measuring the response in your spinal cord and brain (somatosensory evoked potentials). TREATMENT  Treatment for neuropathic pain may change over time. You may need to try different treatment options or a combination of treatments. Some options include:  Over-the-counter pain relievers.  Prescription medicines. Some medicines used to treat other conditions may also help neuropathic pain. These   include medicines to:  Control seizures (anticonvulsants).  Relieve depression (antidepressants).  Prescription-strength pain relievers (narcotics). These are usually used when other pain relievers do not help.  Transcutaneous nerve stimulation (TENS). This uses electrical currents to block painful nerve  signals. The treatment is painless.  Topical and local anesthetics. These are medicines that numb the nerves. They can be injected as a nerve block or applied to the skin.  Alternative treatments, such as:  Acupuncture.  Meditation.  Massage.  Physical therapy.  Pain management programs.  Counseling. HOME CARE INSTRUCTIONS  Learn as much as you can about your condition.  Take medicines only as directed by your health care provider.  Work closely with all your health care providers to find what works best for you.  Have a good support system at home.  Consider joining a chronic pain support group. SEEK MEDICAL CARE IF:  Your pain treatments are not helping.  You are having side effects from your medicines.  You are struggling with fatigue, mood changes, depression, or anxiety.   This information is not intended to replace advice given to you by your health care provider. Make sure you discuss any questions you have with your health care provider.   Document Released: 09/21/2003 Document Revised: 01/14/2014 Document Reviewed: 06/03/2013 Elsevier Interactive Patient Education 2016 [employer]  

## 2014-11-22 ENCOUNTER — Other Ambulatory Visit: Payer: Self-pay | Admitting: Neurology

## 2014-11-23 ENCOUNTER — Telehealth: Payer: Self-pay | Admitting: Neurology

## 2014-11-23 NOTE — Telephone Encounter (Signed)
Patient called to advise that Dr. Jannifer Franklin was going to call in prescription for a 90 day supply of DULoxetine (CYMBALTA) 30 MG capsule to [facility] on [address]. [facility] has never received the prescription.

## 2014-11-23 NOTE — Telephone Encounter (Signed)
I called [facility] on [address]. They do have the Rx for Cymbalta on file. I called the patient to let her know.

## 2015-01-28 ENCOUNTER — Telehealth: Payer: Self-pay | Admitting: Oncology

## 2015-01-28 NOTE — Telephone Encounter (Signed)
Called and left a message with 08/2015 appt as this sch was not avail when she was here last 05/2014

## 2015-04-12 ENCOUNTER — Other Ambulatory Visit: Payer: Self-pay | Admitting: Oncology

## 2015-04-12 DIAGNOSIS — Z9889 Other specified postprocedural states: Secondary | ICD-10-CM

## 2015-04-12 DIAGNOSIS — Z853 Personal history of malignant neoplasm of breast: Secondary | ICD-10-CM

## 2015-06-30 ENCOUNTER — Encounter: Payer: Self-pay | Admitting: Genetic Counselor

## 2015-08-15 ENCOUNTER — Other Ambulatory Visit: Payer: Managed Care, Other (non HMO)

## 2015-08-22 ENCOUNTER — Ambulatory Visit: Payer: Managed Care, Other (non HMO) | Admitting: Oncology

## 2015-08-29 ENCOUNTER — Other Ambulatory Visit: Payer: Managed Care, Other (non HMO)

## 2015-09-01 ENCOUNTER — Ambulatory Visit (INDEPENDENT_AMBULATORY_CARE_PROVIDER_SITE_OTHER): Payer: Managed Care, Other (non HMO) | Admitting: Family Medicine

## 2015-09-01 ENCOUNTER — Encounter: Payer: Self-pay | Admitting: Family Medicine

## 2015-09-01 VITALS — BP 102/60 | HR 44 | Temp 97.7°F | Ht 67.5 in | Wt 120.4 lb

## 2015-09-01 DIAGNOSIS — E559 Vitamin D deficiency, unspecified: Secondary | ICD-10-CM | POA: Diagnosis not present

## 2015-09-01 DIAGNOSIS — Z1159 Encounter for screening for other viral diseases: Secondary | ICD-10-CM | POA: Diagnosis not present

## 2015-09-01 DIAGNOSIS — Z0000 Encounter for general adult medical examination without abnormal findings: Secondary | ICD-10-CM | POA: Diagnosis not present

## 2015-09-01 DIAGNOSIS — Z8249 Family history of ischemic heart disease and other diseases of the circulatory system: Secondary | ICD-10-CM

## 2015-09-01 LAB — CBC WITH DIFFERENTIAL/PLATELET
Basophils Absolute: 0 10*3/uL (ref 0.0–0.1)
Basophils Relative: 0.6 % (ref 0.0–3.0)
EOS PCT: 3.4 % (ref 0.0–5.0)
Eosinophils Absolute: 0.1 10*3/uL (ref 0.0–0.7)
HCT: 37.7 % (ref 36.0–46.0)
Hemoglobin: 12.6 g/dL (ref 12.0–15.0)
LYMPHS ABS: 1.3 10*3/uL (ref 0.7–4.0)
Lymphocytes Relative: 39.7 % (ref 12.0–46.0)
MCHC: 33.5 g/dL (ref 30.0–36.0)
MCV: 98.2 fl (ref 78.0–100.0)
MONO ABS: 0.2 10*3/uL (ref 0.1–1.0)
MONOS PCT: 6.2 % (ref 3.0–12.0)
NEUTROS ABS: 1.7 10*3/uL (ref 1.4–7.7)
NEUTROS PCT: 50.1 % (ref 43.0–77.0)
PLATELETS: 170 10*3/uL (ref 150.0–400.0)
RBC: 3.84 Mil/uL — AB (ref 3.87–5.11)
RDW: 13.1 % (ref 11.5–15.5)
WBC: 3.4 10*3/uL — ABNORMAL LOW (ref 4.0–10.5)

## 2015-09-01 LAB — VITAMIN D 25 HYDROXY (VIT D DEFICIENCY, FRACTURES): VITD: 62.07 ng/mL (ref 30.00–100.00)

## 2015-09-01 LAB — COMPREHENSIVE METABOLIC PANEL WITH GFR
ALT: 30 U/L (ref 0–35)
AST: 34 U/L (ref 0–37)
Albumin: 4.3 g/dL (ref 3.5–5.2)
Alkaline Phosphatase: 69 U/L (ref 39–117)
BUN: 14 mg/dL (ref 6–23)
CO2: 33 meq/L — ABNORMAL HIGH (ref 19–32)
Calcium: 9.3 mg/dL (ref 8.4–10.5)
Chloride: 103 meq/L (ref 96–112)
Creatinine, Ser: 0.93 mg/dL (ref 0.40–1.20)
GFR: 66.69 mL/min
Glucose, Bld: 83 mg/dL (ref 70–99)
Potassium: 4.3 meq/L (ref 3.5–5.1)
Sodium: 140 meq/L (ref 135–145)
Total Bilirubin: 0.5 mg/dL (ref 0.2–1.2)
Total Protein: 7 g/dL (ref 6.0–8.3)

## 2015-09-01 LAB — LIPID PANEL
Cholesterol: 189 mg/dL (ref 0–200)
HDL: 93.5 mg/dL (ref >39.00)
LDL CALC: 85 mg/dL (ref 0–99)
NonHDL: 95.15
Total CHOL/HDL Ratio: 2
Triglycerides: 51 mg/dL (ref 0.0–149.0)
VLDL: 10.2 mg/dL (ref 0.0–40.0)

## 2015-09-01 LAB — POCT URINALYSIS DIPSTICK
BILIRUBIN UA: NEGATIVE
Glucose, UA: NEGATIVE
KETONES UA: NEGATIVE
LEUKOCYTES UA: NEGATIVE
Nitrite, UA: NEGATIVE
PH UA: 5.5
PROTEIN UA: NEGATIVE
RBC UA: NEGATIVE
Urobilinogen, UA: 0.2

## 2015-09-01 LAB — TSH: TSH: 2.21 u[IU]/mL (ref 0.35–4.50)

## 2015-09-01 NOTE — Patient Instructions (Signed)
Preventive Care for Adults, Female A healthy lifestyle and preventive care can promote health and wellness. Preventive health guidelines for women include the following key practices.  A routine yearly physical is a good way to check with your health care provider about your health and preventive screening. It is a chance to share any concerns and updates on your health and to receive a thorough exam.  Visit your dentist for a routine exam and preventive care every 6 months. Brush your teeth twice a day and floss once a day. Good oral hygiene prevents tooth decay and gum disease.  The frequency of eye exams is based on your age, health, family medical history, use of contact lenses, and other factors. Follow your health care provider's recommendations for frequency of eye exams.  Eat a healthy diet. Foods like vegetables, fruits, whole grains, low-fat dairy products, and lean protein foods contain the nutrients you need without too many calories. Decrease your intake of foods high in solid fats, added sugars, and salt. Eat the right amount of calories for you.Get information about a proper diet from your health care provider, if necessary.  Regular physical exercise is one of the most important things you can do for your health. Most adults should get at least 150 minutes of moderate-intensity exercise (any activity that increases your heart rate and causes you to sweat) each week. In addition, most adults need muscle-strengthening exercises on 2 or more days a week.  Maintain a healthy weight. The body mass index (BMI) is a screening tool to identify possible weight problems. It provides an estimate of body fat based on height and weight. Your health care provider can find your BMI and can help you achieve or maintain a healthy weight.For adults 20 years and older:  A BMI below 18.5 is considered underweight.  A BMI of 18.5 to 24.9 is normal.  A BMI of 25 to 29.9 is considered overweight.  A  BMI of 30 and above is considered obese.  Maintain normal blood lipids and cholesterol levels by exercising and minimizing your intake of saturated fat. Eat a balanced diet with plenty of fruit and vegetables. Blood tests for lipids and cholesterol should begin at age 45 and be repeated every 5 years. If your lipid or cholesterol levels are high, you are over 50, or you are at high risk for heart disease, you may need your cholesterol levels checked more frequently.Ongoing high lipid and cholesterol levels should be treated with medicines if diet and exercise are not working.  If you smoke, find out from your health care provider how to quit. If you do not use tobacco, do not start.  Lung cancer screening is recommended for adults aged 45-80 years who are at high risk for developing lung cancer because of a history of smoking. A yearly low-dose CT scan of the lungs is recommended for people who have at least a 30-pack-year history of smoking and are a current smoker or have quit within the past 15 years. A pack year of smoking is smoking an average of 1 pack of cigarettes a day for 1 year (for example: 1 pack a day for 30 years or 2 packs a day for 15 years). Yearly screening should continue until the smoker has stopped smoking for at least 15 years. Yearly screening should be stopped for people who develop a health problem that would prevent them from having lung cancer treatment.  If you are pregnant, do not drink alcohol. If you are  breastfeeding, be very cautious about drinking alcohol. If you are not pregnant and choose to drink alcohol, do not have more than 1 drink per day. One drink is considered to be 12 ounces (355 mL) of beer, 5 ounces (148 mL) of wine, or 1.5 ounces (44 mL) of liquor.  Avoid use of street drugs. Do not share needles with anyone. Ask for help if you need support or instructions about stopping the use of drugs.  High blood pressure causes heart disease and increases the risk  of stroke. Your blood pressure should be checked at least every 1 to 2 years. Ongoing high blood pressure should be treated with medicines if weight loss and exercise do not work.  If you are 55-79 years old, ask your health care provider if you should take aspirin to prevent strokes.  Diabetes screening is done by taking a blood sample to check your blood glucose level after you have not eaten for a certain period of time (fasting). If you are not overweight and you do not have risk factors for diabetes, you should be screened once every 3 years starting at age 45. If you are overweight or obese and you are 40-70 years of age, you should be screened for diabetes every year as part of your cardiovascular risk assessment.  Breast cancer screening is essential preventive care for women. You should practice "breast self-awareness." This means understanding the normal appearance and feel of your breasts and may include breast self-examination. Any changes detected, no matter how small, should be reported to a health care provider. Women in their 20s and 30s should have a clinical breast exam (CBE) by a health care provider as part of a regular health exam every 1 to 3 years. After age 40, women should have a CBE every year. Starting at age 40, women should consider having a mammogram (breast X-ray test) every year. Women who have a family history of breast cancer should talk to their health care provider about genetic screening. Women at a high risk of breast cancer should talk to their health care providers about having an MRI and a mammogram every year.  Breast cancer gene (BRCA)-related cancer risk assessment is recommended for women who have family members with BRCA-related cancers. BRCA-related cancers include breast, ovarian, tubal, and peritoneal cancers. Having family members with these cancers may be associated with an increased risk for harmful changes (mutations) in the breast cancer genes BRCA1 and  BRCA2. Results of the assessment will determine the need for genetic counseling and BRCA1 and BRCA2 testing.  Your health care provider may recommend that you be screened regularly for cancer of the pelvic organs (ovaries, uterus, and vagina). This screening involves a pelvic examination, including checking for microscopic changes to the surface of your cervix (Pap test). You may be encouraged to have this screening done every 3 years, beginning at age 21.  For women ages 30-65, health care providers may recommend pelvic exams and Pap testing every 3 years, or they may recommend the Pap and pelvic exam, combined with testing for human papilloma virus (HPV), every 5 years. Some types of HPV increase your risk of cervical cancer. Testing for HPV may also be done on women of any age with unclear Pap test results.  Other health care providers may not recommend any screening for nonpregnant women who are considered low risk for pelvic cancer and who do not have symptoms. Ask your health care provider if a screening pelvic exam is right for   you.  If you have had past treatment for cervical cancer or a condition that could lead to cancer, you need Pap tests and screening for cancer for at least 20 years after your treatment. If Pap tests have been discontinued, your risk factors (such as having a new sexual partner) need to be reassessed to determine if screening should resume. Some women have medical problems that increase the chance of getting cervical cancer. In these cases, your health care provider may recommend more frequent screening and Pap tests.  Colorectal cancer can be detected and often prevented. Most routine colorectal cancer screening begins at the age of 50 years and continues through age 75 years. However, your health care provider may recommend screening at an earlier age if you have risk factors for colon cancer. On a yearly basis, your health care provider may provide home test kits to check  for hidden blood in the stool. Use of a small camera at the end of a tube, to directly examine the colon (sigmoidoscopy or colonoscopy), can detect the earliest forms of colorectal cancer. Talk to your health care provider about this at age 50, when routine screening begins. Direct exam of the colon should be repeated every 5-10 years through age 75 years, unless early forms of precancerous polyps or small growths are found.  People who are at an increased risk for hepatitis B should be screened for this virus. You are considered at high risk for hepatitis B if:  You were born in a country where hepatitis B occurs often. Talk with your health care provider about which countries are considered high risk.  Your parents were born in a high-risk country and you have not received a shot to protect against hepatitis B (hepatitis B vaccine).  You have HIV or AIDS.  You use needles to inject street drugs.  You live with, or have sex with, someone who has hepatitis B.  You get hemodialysis treatment.  You take certain medicines for conditions like cancer, organ transplantation, and autoimmune conditions.  Hepatitis C blood testing is recommended for all people born from 1945 through 1965 and any individual with known risks for hepatitis C.  Practice safe sex. Use condoms and avoid high-risk sexual practices to reduce the spread of sexually transmitted infections (STIs). STIs include gonorrhea, chlamydia, syphilis, trichomonas, herpes, HPV, and human immunodeficiency virus (HIV). Herpes, HIV, and HPV are viral illnesses that have no cure. They can result in disability, cancer, and death.  You should be screened for sexually transmitted illnesses (STIs) including gonorrhea and chlamydia if:  You are sexually active and are younger than 24 years.  You are older than 24 years and your health care provider tells you that you are at risk for this type of infection.  Your sexual activity has changed  since you were last screened and you are at an increased risk for chlamydia or gonorrhea. Ask your health care provider if you are at risk.  If you are at risk of being infected with HIV, it is recommended that you take a prescription medicine daily to prevent HIV infection. This is called preexposure prophylaxis (PrEP). You are considered at risk if:  You are sexually active and do not regularly use condoms or know the HIV status of your partner(s).  You take drugs by injection.  You are sexually active with a partner who has HIV.  Talk with your health care provider about whether you are at high risk of being infected with HIV. If   you choose to begin PrEP, you should first be tested for HIV. You should then be tested every 3 months for as long as you are taking PrEP.  Osteoporosis is a disease in which the bones lose minerals and strength with aging. This can result in serious bone fractures or breaks. The risk of osteoporosis can be identified using a bone density scan. Women ages 67 years and over and women at risk for fractures or osteoporosis should discuss screening with their health care providers. Ask your health care provider whether you should take a calcium supplement or vitamin D to reduce the rate of osteoporosis.  Menopause can be associated with physical symptoms and risks. Hormone replacement therapy is available to decrease symptoms and risks. You should talk to your health care provider about whether hormone replacement therapy is right for you.  Use sunscreen. Apply sunscreen liberally and repeatedly throughout the day. You should seek shade when your shadow is shorter than you. Protect yourself by wearing long sleeves, pants, a wide-brimmed hat, and sunglasses year round, whenever you are outdoors.  Once a month, do a whole body skin exam, using a mirror to look at the skin on your back. Tell your health care provider of new moles, moles that have irregular borders, moles that  are larger than a pencil eraser, or moles that have changed in shape or color.  Stay current with required vaccines (immunizations).  Influenza vaccine. All adults should be immunized every year.  Tetanus, diphtheria, and acellular pertussis (Td, Tdap) vaccine. Pregnant women should receive 1 dose of Tdap vaccine during each pregnancy. The dose should be obtained regardless of the length of time since the last dose. Immunization is preferred during the 27th-36th week of gestation. An adult who has not previously received Tdap or who does not know her vaccine status should receive 1 dose of Tdap. This initial dose should be followed by tetanus and diphtheria toxoids (Td) booster doses every 10 years. Adults with an unknown or incomplete history of completing a 3-dose immunization series with Td-containing vaccines should begin or complete a primary immunization series including a Tdap dose. Adults should receive a Td booster every 10 years.  Varicella vaccine. An adult without evidence of immunity to varicella should receive 2 doses or a second dose if she has previously received 1 dose. Pregnant females who do not have evidence of immunity should receive the first dose after pregnancy. This first dose should be obtained before leaving the health care facility. The second dose should be obtained 4-8 weeks after the first dose.  Human papillomavirus (HPV) vaccine. Females aged 13-26 years who have not received the vaccine previously should obtain the 3-dose series. The vaccine is not recommended for use in pregnant females. However, pregnancy testing is not needed before receiving a dose. If a female is found to be pregnant after receiving a dose, no treatment is needed. In that case, the remaining doses should be delayed until after the pregnancy. Immunization is recommended for any person with an immunocompromised condition through the age of 61 years if she did not get any or all doses earlier. During the  3-dose series, the second dose should be obtained 4-8 weeks after the first dose. The third dose should be obtained 24 weeks after the first dose and 16 weeks after the second dose.  Zoster vaccine. One dose is recommended for adults aged 30 years or older unless certain conditions are present.  Measles, mumps, and rubella (MMR) vaccine. Adults born  before 1957 generally are considered immune to measles and mumps. Adults born in 1957 or later should have 1 or more doses of MMR vaccine unless there is a contraindication to the vaccine or there is laboratory evidence of immunity to each of the three diseases. A routine second dose of MMR vaccine should be obtained at least 28 days after the first dose for students attending postsecondary schools, health care workers, or international travelers. People who received inactivated measles vaccine or an unknown type of measles vaccine during 1963-1967 should receive 2 doses of MMR vaccine. People who received inactivated mumps vaccine or an unknown type of mumps vaccine before 1979 and are at high risk for mumps infection should consider immunization with 2 doses of MMR vaccine. For females of childbearing age, rubella immunity should be determined. If there is no evidence of immunity, females who are not pregnant should be vaccinated. If there is no evidence of immunity, females who are pregnant should delay immunization until after pregnancy. Unvaccinated health care workers born before 1957 who lack laboratory evidence of measles, mumps, or rubella immunity or laboratory confirmation of disease should consider measles and mumps immunization with 2 doses of MMR vaccine or rubella immunization with 1 dose of MMR vaccine.  Pneumococcal 13-valent conjugate (PCV13) vaccine. When indicated, a person who is uncertain of his immunization history and has no record of immunization should receive the PCV13 vaccine. All adults 65 years of age and older should receive this  vaccine. An adult aged 19 years or older who has certain medical conditions and has not been previously immunized should receive 1 dose of PCV13 vaccine. This PCV13 should be followed with a dose of pneumococcal polysaccharide (PPSV23) vaccine. Adults who are at high risk for pneumococcal disease should obtain the PPSV23 vaccine at least 8 weeks after the dose of PCV13 vaccine. Adults older than 54 years of age who have normal immune system function should obtain the PPSV23 vaccine dose at least 1 year after the dose of PCV13 vaccine.  Pneumococcal polysaccharide (PPSV23) vaccine. When PCV13 is also indicated, PCV13 should be obtained first. All adults aged 65 years and older should be immunized. An adult younger than age 65 years who has certain medical conditions should be immunized. Any person who resides in a nursing home or long-term care facility should be immunized. An adult smoker should be immunized. People with an immunocompromised condition and certain other conditions should receive both PCV13 and PPSV23 vaccines. People with human immunodeficiency virus (HIV) infection should be immunized as soon as possible after diagnosis. Immunization during chemotherapy or radiation therapy should be avoided. Routine use of PPSV23 vaccine is not recommended for American Indians, [place] Natives, or people younger than 65 years unless there are medical conditions that require PPSV23 vaccine. When indicated, people who have unknown immunization and have no record of immunization should receive PPSV23 vaccine. One-time revaccination 5 years after the first dose of PPSV23 is recommended for people aged 19-64 years who have chronic kidney failure, nephrotic syndrome, asplenia, or immunocompromised conditions. People who received 1-2 doses of PPSV23 before age 65 years should receive another dose of PPSV23 vaccine at age 65 years or later if at least 5 years have passed since the previous dose. Doses of PPSV23 are not  needed for people immunized with PPSV23 at or after age 65 years.  Meningococcal vaccine. Adults with asplenia or persistent complement component deficiencies should receive 2 doses of quadrivalent meningococcal conjugate (MenACWY-D) vaccine. The doses should be obtained   at least 2 months apart. Microbiologists working with certain meningococcal bacteria, [place] recruits, people at risk during an outbreak, and people who travel to or live in countries with a high rate of meningitis should be immunized. A first-year college student up through age 34 years who is living in a residence hall should receive a dose if she did not receive a dose on or after her 16th birthday. Adults who have certain high-risk conditions should receive one or more doses of vaccine.  Hepatitis A vaccine. Adults who wish to be protected from this disease, have certain high-risk conditions, work with hepatitis A-infected animals, work in hepatitis A research labs, or travel to or work in countries with a high rate of hepatitis A should be immunized. Adults who were previously unvaccinated and who anticipate close contact with an international adoptee during the first 60 days after arrival in the [place] from a country with a high rate of hepatitis A should be immunized.  Hepatitis B vaccine. Adults who wish to be protected from this disease, have certain high-risk conditions, may be exposed to blood or other infectious body fluids, are household contacts or sex partners of hepatitis B positive people, are clients or workers in certain care facilities, or travel to or work in countries with a high rate of hepatitis B should be immunized.  Haemophilus influenzae type b (Hib) vaccine. A previously unvaccinated person with asplenia or sickle cell disease or having a scheduled splenectomy should receive 1 dose of Hib vaccine. Regardless of previous immunization, a recipient of a hematopoietic stem cell transplant should receive a  3-dose series 6-12 months after her successful transplant. Hib vaccine is not recommended for adults with HIV infection. Preventive Services / Frequency Ages 35 to 4 years  Blood pressure check.** / Every 3-5 years.  Lipid and cholesterol check.** / Every 5 years beginning at age 60.  Clinical breast exam.** / Every 3 years for women in their 71s and 10s.  BRCA-related cancer risk assessment.** / For women who have family members with a BRCA-related cancer (breast, ovarian, tubal, or peritoneal cancers).  Pap test.** / Every 2 years from ages 76 through 26. Every 3 years starting at age 61 through age 76 or 93 with a history of 3 consecutive normal Pap tests.  HPV screening.** / Every 3 years from ages 37 through ages 60 to 51 with a history of 3 consecutive normal Pap tests.  Hepatitis C blood test.** / For any individual with known risks for hepatitis C.  Skin self-exam. / Monthly.  Influenza vaccine. / Every year.  Tetanus, diphtheria, and acellular pertussis (Tdap, Td) vaccine.** / Consult your health care provider. Pregnant women should receive 1 dose of Tdap vaccine during each pregnancy. 1 dose of Td every 10 years.  Varicella vaccine.** / Consult your health care provider. Pregnant females who do not have evidence of immunity should receive the first dose after pregnancy.  HPV vaccine. / 3 doses over 6 months, if 93 and younger. The vaccine is not recommended for use in pregnant females. However, pregnancy testing is not needed before receiving a dose.  Measles, mumps, rubella (MMR) vaccine.** / You need at least 1 dose of MMR if you were born in 1957 or later. You may also need a 2nd dose. For females of childbearing age, rubella immunity should be determined. If there is no evidence of immunity, females who are not pregnant should be vaccinated. If there is no evidence of immunity, females who are  pregnant should delay immunization until after pregnancy.  Pneumococcal  13-valent conjugate (PCV13) vaccine.** / Consult your health care provider.  Pneumococcal polysaccharide (PPSV23) vaccine.** / 1 to 2 doses if you smoke cigarettes or if you have certain conditions.  Meningococcal vaccine.** / 1 dose if you are age 68 to 8 years and a Market researcher living in a residence hall, or have one of several medical conditions, you need to get vaccinated against meningococcal disease. You may also need additional booster doses.  Hepatitis A vaccine.** / Consult your health care provider.  Hepatitis B vaccine.** / Consult your health care provider.  Haemophilus influenzae type b (Hib) vaccine.** / Consult your health care provider. Ages 7 to 53 years  Blood pressure check.** / Every year.  Lipid and cholesterol check.** / Every 5 years beginning at age 25 years.  Lung cancer screening. / Every year if you are aged 11-80 years and have a 30-pack-year history of smoking and currently smoke or have quit within the past 15 years. Yearly screening is stopped once you have quit smoking for at least 15 years or develop a health problem that would prevent you from having lung cancer treatment.  Clinical breast exam.** / Every year after age 48 years.  BRCA-related cancer risk assessment.** / For women who have family members with a BRCA-related cancer (breast, ovarian, tubal, or peritoneal cancers).  Mammogram.** / Every year beginning at age 41 years and continuing for as long as you are in good health. Consult with your health care provider.  Pap test.** / Every 3 years starting at age 65 years through age 37 or 70 years with a history of 3 consecutive normal Pap tests.  HPV screening.** / Every 3 years from ages 72 years through ages 60 to 40 years with a history of 3 consecutive normal Pap tests.  Fecal occult blood test (FOBT) of stool. / Every year beginning at age 21 years and continuing until age 5 years. You may not need to do this test if you get  a colonoscopy every 10 years.  Flexible sigmoidoscopy or colonoscopy.** / Every 5 years for a flexible sigmoidoscopy or every 10 years for a colonoscopy beginning at age 35 years and continuing until age 48 years.  Hepatitis C blood test.** / For all people born from [birth year] through 1965 and any individual with known risks for hepatitis C.  Skin self-exam. / Monthly.  Influenza vaccine. / Every year.  Tetanus, diphtheria, and acellular pertussis (Tdap/Td) vaccine.** / Consult your health care provider. Pregnant women should receive 1 dose of Tdap vaccine during each pregnancy. 1 dose of Td every 10 years.  Varicella vaccine.** / Consult your health care provider. Pregnant females who do not have evidence of immunity should receive the first dose after pregnancy.  Zoster vaccine.** / 1 dose for adults aged 30 years or older.  Measles, mumps, rubella (MMR) vaccine.** / You need at least 1 dose of MMR if you were born in 1957 or later. You may also need a second dose. For females of childbearing age, rubella immunity should be determined. If there is no evidence of immunity, females who are not pregnant should be vaccinated. If there is no evidence of immunity, females who are pregnant should delay immunization until after pregnancy.  Pneumococcal 13-valent conjugate (PCV13) vaccine.** / Consult your health care provider.  Pneumococcal polysaccharide (PPSV23) vaccine.** / 1 to 2 doses if you smoke cigarettes or if you have certain conditions.  Meningococcal vaccine.** /  Consult your health care provider.  Hepatitis A vaccine.** / Consult your health care provider.  Hepatitis B vaccine.** / Consult your health care provider.  Haemophilus influenzae type b (Hib) vaccine.** / Consult your health care provider. Ages 64 years and over  Blood pressure check.** / Every year.  Lipid and cholesterol check.** / Every 5 years beginning at age 23 years.  Lung cancer screening. / Every year if you  are aged 16-80 years and have a 30-pack-year history of smoking and currently smoke or have quit within the past 15 years. Yearly screening is stopped once you have quit smoking for at least 15 years or develop a health problem that would prevent you from having lung cancer treatment.  Clinical breast exam.** / Every year after age 74 years.  BRCA-related cancer risk assessment.** / For women who have family members with a BRCA-related cancer (breast, ovarian, tubal, or peritoneal cancers).  Mammogram.** / Every year beginning at age 44 years and continuing for as long as you are in good health. Consult with your health care provider.  Pap test.** / Every 3 years starting at age 58 years through age 22 or 39 years with 3 consecutive normal Pap tests. Testing can be stopped between 65 and 70 years with 3 consecutive normal Pap tests and no abnormal Pap or HPV tests in the past 10 years.  HPV screening.** / Every 3 years from ages 64 years through ages 70 or 61 years with a history of 3 consecutive normal Pap tests. Testing can be stopped between 65 and 70 years with 3 consecutive normal Pap tests and no abnormal Pap or HPV tests in the past 10 years.  Fecal occult blood test (FOBT) of stool. / Every year beginning at age 40 years and continuing until age 27 years. You may not need to do this test if you get a colonoscopy every 10 years.  Flexible sigmoidoscopy or colonoscopy.** / Every 5 years for a flexible sigmoidoscopy or every 10 years for a colonoscopy beginning at age 7 years and continuing until age 32 years.  Hepatitis C blood test.** / For all people born from [birth year] through 1964 and any individual with known risks for hepatitis C.  Osteoporosis screening.** / A one-time screening for women ages 30 years and over and women at risk for fractures or osteoporosis.  Skin self-exam. / Monthly.  Influenza vaccine. / Every year.  Tetanus, diphtheria, and acellular pertussis (Tdap/Td)  vaccine.** / 1 dose of Td every 10 years.  Varicella vaccine.** / Consult your health care provider.  Zoster vaccine.** / 1 dose for adults aged 35 years or older.  Pneumococcal 13-valent conjugate (PCV13) vaccine.** / Consult your health care provider.  Pneumococcal polysaccharide (PPSV23) vaccine.** / 1 dose for all adults aged 46 years and older.  Meningococcal vaccine.** / Consult your health care provider.  Hepatitis A vaccine.** / Consult your health care provider.  Hepatitis B vaccine.** / Consult your health care provider.  Haemophilus influenzae type b (Hib) vaccine.** / Consult your health care provider. ** Family history and personal history of risk and conditions may change your health care provider's recommendations.   This information is not intended to replace advice given to you by your health care provider. Make sure you discuss any questions you have with your health care provider.   Document Released: 02/19/2001 Document Revised: 01/14/2014 Document Reviewed: 05/21/2010 Elsevier Interactive Patient Education [employer].

## 2015-09-01 NOTE — Progress Notes (Signed)
Subjective:     Jeanette Boone is a 55 y.o. female and is here for a comprehensive physical exam. The patient reports no problems.  Social History   Social History  . Marital status: Married    Spouse name: donald  . Number of children: 0  . Years of education: college   Occupational History  . [employer]   Social History Main Topics  . Smoking status: Former Research scientist (life sciences)  . Smokeless tobacco: Never Used  . Alcohol use Yes     Comment: 1 or 2 per week  . Drug use:     Types: Methaqualone  . Sexual activity: Yes    Partners: Male   Other Topics Concern  . Not on file   Social History Narrative   Exercise-- no      Patient is right handed.   Patient does not drink caffeine.   Health Maintenance  Topic Date Due  . Hepatitis C Screening  05-07-1960  . HIV Screening  04/24/1976  . INFLUENZA VACCINE  08/08/2015  . PAP SMEAR  11/12/2015  . MAMMOGRAM  09/13/2016  . TETANUS/TDAP  02/02/2018  . 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 She  does not have any pertinent problems on file. She  has a past surgical history that includes Hernia repair (1995); Hernia repair (1996); Cholecystectomy (05/2008); uterine polyp removal (06/2008); Colonoscopy (08/2009); port a cath insertion (06/2008); Breast surgery (05/2008); and laparoscopy. Her family history includes Heart disease in her father; Heart disease (age of onset: 81) in her maternal grandfather; Heart disease (age of onset: 69) in her paternal grandfather; Hypertension in her mother; Stroke in her maternal grandfather and maternal grandmother. She  reports that she has quit smoking. She has never used smokeless tobacco. She reports that she drinks alcohol. She  reports that she uses drugs, including Methaqualone. She has a current medication list which includes the following prescription(s): b complex vitamins, biotin, cholecalciferol, duloxetine, gabapentin, multivitamin, and valacyclovir. Current Outpatient Prescriptions on File Prior to Visit  Medication Sig Dispense Refill  . B Complex Vitamins (B-COMPLEX/B-12 PO) Take by mouth.    . Biotin 1000 MCG tablet Take 1,000 mcg by mouth daily.     . cholecalciferol (VITAMIN D) 1000 UNITS tablet Take 2,000 Units by mouth daily.    . DULoxetine (CYMBALTA) 30 MG capsule Take 1 capsule (30 mg total) by mouth daily. 90 capsule 3  . gabapentin (NEURONTIN) 300 MG capsule Take 3 capsules (900 mg total) by mouth 4 (four) times daily. 1080 capsule 3  . Multiple Vitamin (MULTIVITAMIN) tablet Take 1 tablet by mouth daily.    . valACYclovir (VALTREX) 500 MG tablet Take 500 mg by mouth daily.      No current facility-administered medications on file prior to visit.    She is allergic to clarithromycin and penicillins..  Review of Systems Review of Systems  Constitutional: Negative for activity change, appetite change and fatigue.  HENT: Negative for congestion , and ear discharge.   + hearing loss and tinnitus, dentist q12m Eyes: Negative for visual disturbance (see optho q1y -- vision corrected to 20/20 with glasses).  Respiratory: Negative for cough, chest tightness and shortness of breath.   Cardiovascular: Negative for chest pain, palpitations and leg swelling.  Gastrointestinal: Negative for  abdominal pain, diarrhea, constipation and abdominal distention.  Genitourinary: Negative for urgency, frequency, decreased urine volume and difficulty urinating.  Musculoskeletal: Negative for back pain, arthralgias and gait problem.  Skin: Negative for color change, pallor and rash.  Neurological: Negative for dizziness, light-headedness, numbness and headaches.  Hematological: Negative for adenopathy. Does not  bruise/bleed easily.  Psychiatric/Behavioral: Negative for suicidal ideas, confusion, sleep disturbance, self-injury, dysphoric mood, decreased concentration and agitation.       Objective:    BP 102/60 (BP Location: Left Arm, Patient Position: Sitting, Cuff Size: Small)   Pulse (!) 44   Temp 97.7 F (36.5 C) (Oral)   Ht 5' 7.5" (1.715 m)   Wt 120 lb 6.4 oz (54.6 kg)   SpO2 99%   BMI 18.58 kg/m  General appearance: alert, cooperative, appears stated age and no distress Head: Normocephalic, without obvious abnormality, atraumatic Eyes: conjunctivae/corneas clear. PERRL, EOM's intact. Fundi benign. Ears: normal TM's and external ear canals both ears Nose: Nares normal. Septum midline. Mucosa normal. No drainage or sinus tenderness. Throat: lips, mucosa, and tongue normal; teeth and gums normal Neck: no adenopathy, no carotid bruit, no JVD, supple, symmetrical, trachea midline and thyroid not enlarged, symmetric, no tenderness/mass/nodules Back: symmetric, no curvature. ROM normal. No CVA tenderness. Lungs: clear to auscultation bilaterally Breasts: gyn Heart: regular rate and rhythm, S1, S2 normal, no murmur, click, rub or gallop Abdomen: soft, non-tender; bowel sounds normal; no masses,  no organomegaly Pelvic: deferred--gyn Extremities: extremities normal, atraumatic, no cyanosis or edema Pulses: 2+ and symmetric Skin: Skin color, texture, turgor normal. No rashes or lesions Lymph nodes: Cervical, supraclavicular, and axillary nodes normal. Neurologic: Alert and oriented X 3, normal strength and tone. Normal symmetric reflexes. Normal coordination and gait    Assessment:    Healthy female exam.   Plan:    ghm utd Check labs See After Visit Summary for Counseling Recommendations    1. Need for hepatitis C screening test  - Hepatitis C antibody  2. Preventative health care See above - Comprehensive metabolic panel - Lipid panel - CBC with Differential/Platelet -  POCT urinalysis dipstick - TSH  3. Family history of ischemic heart disease  - EKG 12-Lead  4. Vitamin D deficiency  - Vitamin D (25 hydroxy)

## 2015-09-01 NOTE — Progress Notes (Signed)
Pre visit review using our clinic review tool, if applicable. No additional management support is needed unless otherwise documented below in the visit note. 

## 2015-09-02 LAB — HEPATITIS C ANTIBODY: HCV Ab: NEGATIVE

## 2015-09-04 ENCOUNTER — Ambulatory Visit: Payer: Managed Care, Other (non HMO) | Admitting: Oncology

## 2015-09-06 ENCOUNTER — Telehealth: Payer: Self-pay | Admitting: Family Medicine

## 2015-09-06 DIAGNOSIS — D696 Thrombocytopenia, unspecified: Secondary | ICD-10-CM

## 2015-09-06 NOTE — Telephone Encounter (Signed)
Returning call, please call [phone]

## 2015-09-06 NOTE — Telephone Encounter (Signed)
Pt notified of lab and lab appt scheduled for tomorrow at 8:15am. Future order entered.

## 2015-09-06 NOTE — Telephone Encounter (Addendum)
Left message for pt to return my call. See 09/01/15 lab note.

## 2015-09-07 ENCOUNTER — Other Ambulatory Visit (INDEPENDENT_AMBULATORY_CARE_PROVIDER_SITE_OTHER): Payer: Managed Care, Other (non HMO)

## 2015-09-07 DIAGNOSIS — D696 Thrombocytopenia, unspecified: Secondary | ICD-10-CM | POA: Diagnosis not present

## 2015-09-07 LAB — CBC WITH DIFFERENTIAL/PLATELET
BASOS ABS: 0 10*3/uL (ref 0.0–0.1)
Basophils Relative: 0.7 % (ref 0.0–3.0)
EOS PCT: 2.4 % (ref 0.0–5.0)
Eosinophils Absolute: 0.1 10*3/uL (ref 0.0–0.7)
HCT: 35.9 % — ABNORMAL LOW (ref 36.0–46.0)
HEMOGLOBIN: 12.1 g/dL (ref 12.0–15.0)
LYMPHS ABS: 1.5 10*3/uL (ref 0.7–4.0)
Lymphocytes Relative: 39.5 % (ref 12.0–46.0)
MCHC: 33.8 g/dL (ref 30.0–36.0)
MCV: 97.9 fl (ref 78.0–100.0)
MONO ABS: 0.3 10*3/uL (ref 0.1–1.0)
Monocytes Relative: 6.7 % (ref 3.0–12.0)
NEUTROS PCT: 50.7 % (ref 43.0–77.0)
Neutro Abs: 2 10*3/uL (ref 1.4–7.7)
Platelets: 184 10*3/uL (ref 150.0–400.0)
RBC: 3.66 Mil/uL — AB (ref 3.87–5.11)
RDW: 12.8 % (ref 11.5–15.5)
WBC: 3.9 10*3/uL — AB (ref 4.0–10.5)

## 2015-09-08 ENCOUNTER — Other Ambulatory Visit: Payer: Self-pay | Admitting: *Deleted

## 2015-09-08 DIAGNOSIS — C50211 Malignant neoplasm of upper-inner quadrant of right female breast: Secondary | ICD-10-CM

## 2015-09-12 ENCOUNTER — Other Ambulatory Visit (HOSPITAL_BASED_OUTPATIENT_CLINIC_OR_DEPARTMENT_OTHER): Payer: Managed Care, Other (non HMO)

## 2015-09-12 DIAGNOSIS — Z853 Personal history of malignant neoplasm of breast: Secondary | ICD-10-CM

## 2015-09-12 DIAGNOSIS — C50211 Malignant neoplasm of upper-inner quadrant of right female breast: Secondary | ICD-10-CM

## 2015-09-12 LAB — CBC WITH DIFFERENTIAL/PLATELET
BASO%: 0.8 % (ref 0.0–2.0)
Basophils Absolute: 0 10*3/uL (ref 0.0–0.1)
EOS ABS: 0.1 10*3/uL (ref 0.0–0.5)
EOS%: 3.1 % (ref 0.0–7.0)
HCT: 35.4 % (ref 34.8–46.6)
HEMOGLOBIN: 11.9 g/dL (ref 11.6–15.9)
LYMPH#: 1.8 10*3/uL (ref 0.9–3.3)
LYMPH%: 51.3 % — ABNORMAL HIGH (ref 14.0–49.7)
MCH: 32.9 pg (ref 25.1–34.0)
MCHC: 33.6 g/dL (ref 31.5–36.0)
MCV: 97.8 fL (ref 79.5–101.0)
MONO#: 0.3 10*3/uL (ref 0.1–0.9)
MONO%: 7 % (ref 0.0–14.0)
NEUT%: 37.8 % — ABNORMAL LOW (ref 38.4–76.8)
NEUTROS ABS: 1.4 10*3/uL — AB (ref 1.5–6.5)
Platelets: 172 10*3/uL (ref 145–400)
RBC: 3.62 10*6/uL — ABNORMAL LOW (ref 3.70–5.45)
RDW: 12.3 % (ref 11.2–14.5)
WBC: 3.6 10*3/uL — AB (ref 3.9–10.3)

## 2015-09-12 LAB — COMPREHENSIVE METABOLIC PANEL
ALBUMIN: 4 g/dL (ref 3.5–5.0)
ALK PHOS: 76 U/L (ref 40–150)
ALT: 28 U/L (ref 0–55)
AST: 30 U/L (ref 5–34)
Anion Gap: 8 mEq/L (ref 3–11)
BILIRUBIN TOTAL: 0.6 mg/dL (ref 0.20–1.20)
BUN: 13.8 mg/dL (ref 7.0–26.0)
CO2: 29 mEq/L (ref 22–29)
CREATININE: 0.8 mg/dL (ref 0.6–1.1)
Calcium: 9.5 mg/dL (ref 8.4–10.4)
Chloride: 103 mEq/L (ref 98–109)
EGFR: 79 mL/min/{1.73_m2} — ABNORMAL LOW (ref >90)
GLUCOSE: 73 mg/dL (ref 70–140)
Potassium: 4.4 mEq/L (ref 3.5–5.1)
SODIUM: 139 meq/L (ref 136–145)
TOTAL PROTEIN: 7 g/dL (ref 6.4–8.3)

## 2015-09-14 ENCOUNTER — Telehealth: Payer: Self-pay

## 2015-09-14 ENCOUNTER — Other Ambulatory Visit: Payer: Self-pay | Admitting: Oncology

## 2015-09-14 DIAGNOSIS — Z9889 Other specified postprocedural states: Secondary | ICD-10-CM

## 2015-09-14 DIAGNOSIS — Z853 Personal history of malignant neoplasm of breast: Secondary | ICD-10-CM

## 2015-09-14 NOTE — Telephone Encounter (Signed)
MM order faxed

## 2015-09-15 ENCOUNTER — Other Ambulatory Visit: Payer: Self-pay | Admitting: Oncology

## 2015-09-15 ENCOUNTER — Ambulatory Visit
Admission: RE | Admit: 2015-09-15 | Discharge: 2015-09-15 | Disposition: A | Discharge status: Home / Self Care | Payer: Managed Care, Other (non HMO) | Source: Ambulatory Visit | Attending: Oncology | Admitting: Oncology

## 2015-09-15 DIAGNOSIS — Z9889 Other specified postprocedural states: Secondary | ICD-10-CM

## 2015-09-15 DIAGNOSIS — Z853 Personal history of malignant neoplasm of breast: Secondary | ICD-10-CM

## 2015-09-17 NOTE — Progress Notes (Signed)
ID: Jeanette Boone   DOB: January 15, 1960  MR#: [id number]  QJJ#:[id number]  PCP: Garnet Koyanagi MD GYN: Princess Bruins MD SU: Fanny Skates MD OTHER MD: Morene Antu, Gery Pray, Jari Pigg, Lenor Coffin  CHIEF COMPLAINT: HER-2 positive breast cancer  CURRENT TREATMENT: Observation   BREAST CANCER HISTORY:  From the original intake note:  She herself palpated a mass in her right breast in 04/2008.  The patient tells me that she had lost approximately 100 pounds by diet and exercise over the past year, and that this made her breasts lumpier feeling.  In any case, she brought this question to Dr. Truddie Hidden Lavoie's attention, and was set up for mammography at [place] [facility] on 04/21/2008.  She had had a mammogram in 09/2007, which had been unremarkable.  The mammogram on 05/01/2008 showed a dense right breast with a suggestion of a possible mass in the upper central breast corresponding to the palpable abnormality.  Ultrasound was more revealing, and did show an irregularly bordered hypoechoic solid mass measuring 1.2 cm.  The patient was brought back for biopsy and clip placement on 04/23, and the pathology from that procedure ([id number] and [id number]) showed an invasive ductal carcinoma which was negative for the estrogen and progesterone receptor at 0 and 0 respectively.  The Ki-67 was 19%, and CISH was amplified with a ratio of 2.22 (this is just above the "indeterminate" range).  Bilateral breast MRIs were obtained on 04/30.  This showed in the right breast an enhancing rounded mass measuring up to 1.8 cm.  The left breast was unremarkable, and there were no enlarged or suspicious internal mammary or axillary nodes on either side.  With this information, the patient proceeded to definitive right lumpectomy and sentinel lymph node sampling on 05/16/2008.  The final pathology ([id number]) confirmed a 1.5 cm invasive ductal carcinoma, grade 2, with negative margins, no evidence of lymphovascular  invasion, and 0 of 3 sentinel lymph nodes involved.  The estrogen receptor test was repeated ([id number]), and was again negative with a strong positive control.  Her subsequent history is as detailed below.   INTERVAL HISTORY:  Jeanette Boone for followup of her HER-2/neu positive breast cancer. Interval history is generally unremarkable except that she fell while jogging in [place] and bruised her left side. She then fell again (the path was followed bruits) and injured her right rib.  REVIEW OF SYSTEMS:  Jeanette Boone still has the neuropathic discomfort, but she doesn't even mention it on a less asked because it is so chronic. Her children are now 77 and 74 and the RN different schools in participating difference [place] so she and her husband have had to hire someone to be, and take them home in the afternoons as well as to drive into various activities. She continues to work full time. She has lost some weight, but she has been trying to, dieting, and increasing her exercise Aside from the pain in the right side from the fall and the concerns about low counts noted by her primary care physician, a detailed review of systems today was noncontributory .  PAST MEDICAL HISTORY: Past Medical History:  Diagnosis Date  . Cancer ([facility])    breast  . Chronic leg pain   . Endometriosis   . Obesity   . Paresthesias    Generalized  . 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 Thrombocytopenia, secondary    secondary to valtrex  1. Obesity.  As stated, the patient has been on a very strict 1200-1400  calorie diet and 5 days of exercise for over a year, and tells me she has lost over 100 pounds.  She tells me she has done this in the past, and that she loses weight easily, but also gains weight easily.  She is now a bit below where she would like to be, her goal being approximately 140 pounds. 2. She had an episode of cholecystitis, and underwent cholecystectomy on 06/01/2008 under Dr. Dalbert Batman.  The pathology there  ([id number]) showed only chronic cholecystitis and cholelithiasis with a benign cystic duct lymph node. 3. The patient has a history of endometriosis. 4. Status post umbilical hernia repair. 5. The patient tried for many years to get pregnant and took "large doses of fertility drugs" for about 8 or 9 years before giving up on those attempts.       6.   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          7.  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  The patient's mother is alive.  She has a history of bladder cancer.  Both the patient's parents were single children.  The patient has one sister and two brothers.  There is no history of breast cancer in the immediate family.  GYNECOLOGIC HISTORY: She is GX, P0. She was having regular periods at the time of her cancer diagnosis, these were interrupted by chemotherapy, then resumed. Her periods are now irregular. She is having mild hot flashes.  SOCIAL HISTORY: Jeanette Boone works as a Paramedic.  Her husband, [name], is an Chief Financial Officer.  Their two children, [name] and [name],  were adopted from [place].  The patient attends the [employer].    ADVANCED DIRECTIVES Not in place    HEALTH MAINTENANCE: Social History  Substance Use Topics  . Smoking status: Former Research scientist (life sciences)  . Smokeless tobacco: Never Used  . Alcohol use Yes     Comment: 1 or 2 per week     Colonoscopy: 2011  PAP: UTD  Bone density:  Lipid panel:  Allergies  Allergen Reactions  . Clarithromycin     Thrush  . Penicillins Other (See Comments)    Child hood     Current Outpatient Prescriptions  Medication Sig Dispense Refill  . B Complex Vitamins (B-COMPLEX/B-12 PO) Take by mouth.     . Biotin 1000 MCG tablet Take 1,000 mcg by mouth daily.     . cholecalciferol (VITAMIN D) 1000 UNITS tablet Take 2,000 Units by mouth daily.    . DULoxetine (CYMBALTA) 30 MG capsule Take 1 capsule (30 mg total) by mouth daily. 90 capsule 3  . gabapentin (NEURONTIN) 300 MG capsule Take 3 capsules (900 mg total) by mouth 4 (four) times daily. 1080 capsule 3  . Multiple Vitamin (MULTIVITAMIN) tablet Take 1 tablet by mouth daily.    . valACYclovir (VALTREX) 500 MG tablet Take 500 mg by mouth daily.      No current facility-administered medications for this visit.     OBJECTIVE: Middle-aged white woman Who appears stated age 55:   09/18/15 1222  BP: 103/63  Pulse: (!) 52  Resp: 18  Temp: 98.1 F (36.7 C)     Body mass index is 18.93 kg/m.  ECOG FS: 1 Filed Weights   09/18/15 1222  Weight: 122 lb 11.2 oz (55.7 kg)   Sclerae unicteric, EOMs intact Oropharynx clear and moist No cervical or supraclavicular adenopathy Lungs no rales or rhonchi Heart regular rate and rhythm Abd soft, nontender, positive bowel sounds MSK no focal spinal tenderness, no upper extremity lymphedema Neuro: nonfocal, well oriented, appropriate affect Breasts: The right breast is status post lumpectomy and radiation. There is no evidence of disease recurrence in the breast. Under the breast on the rib cage is one spot that does hurt. There is no erythema associated with this. The right axilla is benign. The left breast is unremarkable.    LAB RESULTS: Lab Results  Component Value Date   WBC 3.6 (L) 09/12/2015   NEUTROABS 1.4 (L) 09/12/2015   HGB 11.9 09/12/2015   HCT 35.4 09/12/2015   MCV 97.8 09/12/2015   PLT 172 09/12/2015      Chemistry      Component Value Date/Time   NA 139 09/12/2015 1239   K 4.4 09/12/2015 1239   CL 103 09/01/2015 0947   CL 105 03/26/2012 1010   CO2 29 09/12/2015 1239   BUN 13.8 09/12/2015 1239   CREATININE 0.8 09/12/2015 1239      Component Value Date/Time    CALCIUM 9.5 09/12/2015 1239   ALKPHOS 76 09/12/2015 1239   AST 30 09/12/2015 1239   ALT 28 09/12/2015 1239   BILITOT 0.60 09/12/2015 1239        STUDIES: US Breast Ltd Uni Right Inc Axilla  Result Date: 09/15/2015 CLINICAL DATA:  Patient with history of right breast lumpectomy. Patient with focal tenderness within the lateral right breast posteriorly. EXAM: 2D DIGITAL DIAGNOSTIC BILATERAL MAMMOGRAM WITH CAD AND ADJUNCT TOMO ULTRASOUND RIGHT BREAST COMPARISON:  Previous exam(s). ACR Breast Density Category d: The breast tissue is extremely dense, which lowers the sensitivity of mammography. FINDINGS: No concerning masses, calcifications or nonsurgical architectural distortion identified within the right breast. Postlumpectomy changes right breast. Mammographic images were processed with CAD. On physical exam, I palpate no discrete mass within the lower lateral right breast. Targeted ultrasound is performed, showing normal dense tissue without suspicious mass within the lateral right breast. IMPRESSION: No mammographic evidence for malignancy. RECOMMENDATION: Screening mammogram in one year.(Code:SM-B-01Y) I have discussed the findings and recommendations with the patient. Results were also provided in writing at the conclusion of the visit. If applicable, a reminder letter will be sent to the patient regarding the next appointment. BI-RADS CATEGORY  1: Negative. Electronically Signed   By: Lovey Newcomer M.D.   On: 09/15/2015 13:16   Mm Diag Breast Tomo Bilateral  Result Date: 09/15/2015 CLINICAL DATA:  Patient with history of right breast lumpectomy. Patient with focal tenderness within the lateral right breast posteriorly. EXAM: 2D DIGITAL DIAGNOSTIC BILATERAL MAMMOGRAM WITH CAD AND ADJUNCT TOMO ULTRASOUND RIGHT BREAST COMPARISON:  Previous exam(s). ACR Breast Density Category d: The breast tissue is extremely dense, which lowers the sensitivity of mammography. FINDINGS: No concerning masses,  calcifications or nonsurgical architectural distortion identified within the right breast. Postlumpectomy changes right breast. Mammographic images were processed with CAD. On physical exam, I palpate no discrete mass within the lower lateral right breast. Targeted ultrasound is performed, showing normal dense tissue without suspicious mass within the lateral right breast. IMPRESSION: No mammographic evidence for malignancy. RECOMMENDATION: Screening mammogram in one year.(Code:SM-B-01Y) I have discussed the findings and recommendations with the patient. Results were also provided in writing at the conclusion of  the visit. If applicable, a reminder letter will be sent to the patient regarding the next appointment. BI-RADS CATEGORY  1: Negative. Electronically Signed   By: Lovey Newcomer M.D.   On: 09/15/2015 13:16     ASSESSMENT: 55 y.o.  BRCA negative [place] woman  (1) status post right upper inner quadrant lumpectomy and sentinel lymph node dissection May 2010 for a T1c N0, stage 1A invasive ductal carcinoma,  grade 2,  estrogen and progesterone receptor negative, but HER-2/neu positive.  The MIB-1 was 19%.   (2) received 4 cycles of docetaxel/carboplatin/trastuzumab adjuvantly  (3) she continued Herceptin to July 2010 with echocardiogram showing a well-preserved ejection fraction after the completion of treatment.  (4) she completed radiation in December 2010.   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 This is very favorable.  I think the pain in the right rib cage area is going to be secondary to trauma from her fall and month ago, but we will obtain plain films today.  I am concerned about her weight loss of about 60 pounds, which she has done this in the past. She becomes very active in sports, diets's strenuously, and  loses the weight, and then later regains it. Nevertheless I'm going to obtain a CA-27-29 today just to make sure there are no surprises.  A lowish white cell count is not uncommon in patients who have had remote chemotherapy. I'm repeating it today and I am also obtaining a blood films so I can review it under the microscope.  Assuming all these tests come back unremarkable, I will see her again in one year. Otherwise we will proceed with further evaluation.  Center has a good understanding of this plan. She agrees with it. She knows to call for any problems that may develop before the next visit here.     Esterlene Atiyeh C    09/18/2015

## 2015-09-18 ENCOUNTER — Ambulatory Visit (HOSPITAL_BASED_OUTPATIENT_CLINIC_OR_DEPARTMENT_OTHER): Payer: Managed Care, Other (non HMO)

## 2015-09-18 ENCOUNTER — Encounter: Payer: Self-pay | Admitting: Oncology

## 2015-09-18 ENCOUNTER — Ambulatory Visit (HOSPITAL_COMMUNITY)
Admission: RE | Admit: 2015-09-18 | Discharge: 2015-09-18 | Disposition: A | Discharge status: Home / Self Care | Payer: Managed Care, Other (non HMO) | Source: Ambulatory Visit | Attending: Oncology | Admitting: Oncology

## 2015-09-18 ENCOUNTER — Other Ambulatory Visit: Payer: Self-pay | Admitting: Oncology

## 2015-09-18 ENCOUNTER — Telehealth: Payer: Self-pay | Admitting: Oncology

## 2015-09-18 ENCOUNTER — Ambulatory Visit (HOSPITAL_BASED_OUTPATIENT_CLINIC_OR_DEPARTMENT_OTHER): Payer: Managed Care, Other (non HMO) | Admitting: Oncology

## 2015-09-18 VITALS — BP 103/63 | HR 52 | Temp 98.1°F | Resp 18 | Ht 67.5 in | Wt 122.7 lb

## 2015-09-18 DIAGNOSIS — C50211 Malignant neoplasm of upper-inner quadrant of right female breast: Secondary | ICD-10-CM | POA: Insufficient documentation

## 2015-09-18 DIAGNOSIS — S2241XA Multiple fractures of ribs, right side, initial encounter for closed fracture: Secondary | ICD-10-CM | POA: Diagnosis not present

## 2015-09-18 DIAGNOSIS — Z853 Personal history of malignant neoplasm of breast: Secondary | ICD-10-CM

## 2015-09-18 DIAGNOSIS — R0781 Pleurodynia: Secondary | ICD-10-CM | POA: Insufficient documentation

## 2015-09-18 DIAGNOSIS — R634 Abnormal weight loss: Secondary | ICD-10-CM

## 2015-09-18 LAB — COMPREHENSIVE METABOLIC PANEL
ALT: 39 U/L (ref 0–55)
AST: 35 U/L — AB (ref 5–34)
Albumin: 4 g/dL (ref 3.5–5.0)
Alkaline Phosphatase: 79 U/L (ref 40–150)
Anion Gap: 7 mEq/L (ref 3–11)
BUN: 13.7 mg/dL (ref 7.0–26.0)
CALCIUM: 9.6 mg/dL (ref 8.4–10.4)
CHLORIDE: 105 meq/L (ref 98–109)
CO2: 28 meq/L (ref 22–29)
CREATININE: 0.9 mg/dL (ref 0.6–1.1)
EGFR: 75 mL/min/{1.73_m2} — ABNORMAL LOW (ref >90)
GLUCOSE: 69 mg/dL — AB (ref 70–140)
POTASSIUM: 4.1 meq/L (ref 3.5–5.1)
SODIUM: 141 meq/L (ref 136–145)
Total Bilirubin: 0.44 mg/dL (ref 0.20–1.20)
Total Protein: 7 g/dL (ref 6.4–8.3)

## 2015-09-18 LAB — CBC & DIFF AND RETIC
BASO%: 0.9 % (ref 0.0–2.0)
BASOS ABS: 0 10*3/uL (ref 0.0–0.1)
EOS ABS: 0.1 10*3/uL (ref 0.0–0.5)
EOS%: 2.6 % (ref 0.0–7.0)
HEMATOCRIT: 35.1 % (ref 34.8–46.6)
HEMOGLOBIN: 11.7 g/dL (ref 11.6–15.9)
IMMATURE RETIC FRACT: 4.5 % (ref 1.60–10.00)
LYMPH%: 45.7 % (ref 14.0–49.7)
MCH: 32.5 pg (ref 25.1–34.0)
MCHC: 33.3 g/dL (ref 31.5–36.0)
MCV: 97.5 fL (ref 79.5–101.0)
MONO#: 0.2 10*3/uL (ref 0.1–0.9)
MONO%: 5.1 % (ref 0.0–14.0)
NEUT%: 45.7 % (ref 38.4–76.8)
NEUTROS ABS: 1.6 10*3/uL (ref 1.5–6.5)
Platelets: 149 10*3/uL (ref 145–400)
RBC: 3.6 10*6/uL — ABNORMAL LOW (ref 3.70–5.45)
RDW: 12.5 % (ref 11.2–14.5)
RETIC %: 0.7 % (ref 0.70–2.10)
Retic Ct Abs: 25.2 10*3/uL — ABNORMAL LOW (ref 33.70–90.70)
WBC: 3.5 10*3/uL — AB (ref 3.9–10.3)
lymph#: 1.6 10*3/uL (ref 0.9–3.3)

## 2015-09-18 LAB — CHCC SMEAR

## 2015-09-18 NOTE — Telephone Encounter (Signed)
appt made and avs printed °

## 2015-09-19 LAB — CANCER ANTIGEN 27.29: CAN 27.29: 23.2 U/mL (ref 0.0–38.6)

## 2015-09-22 ENCOUNTER — Encounter: Payer: Self-pay | Admitting: Neurology

## 2015-11-01 ENCOUNTER — Telehealth: Payer: Self-pay | Admitting: Genetic Counselor

## 2015-11-01 NOTE — Telephone Encounter (Signed)
LM on VM that I was returning her call.  Left CB instructions.

## 2015-11-02 ENCOUNTER — Encounter: Payer: Self-pay | Admitting: Genetic Counselor

## 2015-11-13 ENCOUNTER — Other Ambulatory Visit: Payer: Self-pay | Admitting: Neurology

## 2015-11-16 ENCOUNTER — Ambulatory Visit: Payer: 59 | Admitting: Neurology

## 2015-11-20 ENCOUNTER — Encounter: Payer: Self-pay | Admitting: Neurology

## 2015-11-20 ENCOUNTER — Ambulatory Visit (INDEPENDENT_AMBULATORY_CARE_PROVIDER_SITE_OTHER): Payer: 59 | Admitting: Neurology

## 2015-11-20 VITALS — BP 121/68 | HR 42 | Ht 67.5 in | Wt 122.5 lb

## 2015-11-20 DIAGNOSIS — G549 Nerve root and plexus disorder, unspecified: Secondary | ICD-10-CM

## 2015-11-20 MED ORDER — DULOXETINE HCL 30 MG PO CPEP: ORAL_CAPSULE | ORAL | 3 refills | Status: DC

## 2015-11-20 MED ORDER — GABAPENTIN 300 MG PO CAPS: 900.0000 mg | ORAL_CAPSULE | Freq: Four times a day (QID) | ORAL | 3 refills | Status: DC

## 2015-11-20 NOTE — Progress Notes (Signed)
Reason for visit: paresthesias  Jeanette Boone is an 55 y.o. female  History of present illness:  Jeanette Boone is a 55 year old right-handed white female with a history of paresthesias on all 4 extremities and on the head and neck. The patient has had these issues for a number of years, they have minimally changed over years. The etiology is not clear. The patient has had a mildly elevated rheumatoid factor, a rheumatology evaluation previously was unremarkable. The patient has occasional sharp tingling pains, burning sensations and numbness. The patient is on gabapentin in a maximal dose, she takes low-dose Cymbalta taking 30 mg daily. She denies weakness, she may have some mild gait instability, she denies any issues controlling the bowels or the bladder. She denies any headache or visual field changes. Caffeine or cold exposure may worsen her symptoms. The patient is able sleep relatively well. 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 Doran Durand from podiatry. The patient returns for an evaluation.  Past Medical History:  Diagnosis Date  . Cancer (HCC)    breast  . Chronic leg pain   . Endometriosis   . Obesity   . Paresthesias    Generalized  . 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 Thrombocytopenia, secondary    secondary to valtrex    Past Surgical History:  Procedure Laterality Date  . BREAST SURGERY  05/2008   lumpectomy - right  . CHOLECYSTECTOMY  05/2008  . COLONOSCOPY  08/2009  . HERNIA REPAIR  [id number]   umbilical  . HERNIA REPAIR  1996  . LAPAROSCOPY    . port a cath insertion  06/2008  . uterine polyp removal  06/2008    Family History  Problem Relation Age of Onset  . Heart disease Father     pacemaker----tachycardia  . Stroke Maternal Grandmother   . Heart disease Maternal Grandfather 4    MI  . Stroke Maternal Grandfather   . Heart disease Paternal Grandfather 21    MI  . Hypertension Mother     Social history:  reports that she has quit smoking. She has  never used smokeless tobacco. She reports that she drinks alcohol. She reports that she uses drugs, including Methaqualone.    Allergies  Allergen Reactions  . Clarithromycin     Thrush  . Penicillins Other (See Comments)    Child hood     Medications:  Prior to Admission medications   Medication Sig Start Date End Date Taking? Authorizing Provider  B Complex Vitamins (B-COMPLEX/B-12 PO) Take by mouth.   Yes Historical Provider, MD  Biotin 1000 MCG tablet Take 1,000 mcg by mouth daily.    Yes Historical Provider, MD  cholecalciferol (VITAMIN D) 1000 UNITS tablet Take 2,000 Units by mouth daily.   Yes Historical Provider, MD  DULoxetine (CYMBALTA) 30 MG capsule TAKE 1 CAPSULE(30 MG) BY MOUTH DAILY Patient taking differently: TAKE 1 CAPSULE(30 MG) BY MOUTH DAILY (Brand Name) 11/14/15  Yes Kathrynn Ducking, MD  gabapentin (NEURONTIN) 300 MG capsule Take 3 capsules (900 mg total) by mouth 4 (four) times daily. 11/15/14  Yes Kathrynn Ducking, MD  Multiple Vitamin (MULTIVITAMIN) tablet Take 1 tablet by mouth daily.   Yes Historical Provider, MD  valACYclovir (VALTREX) 500 MG tablet Take 500 mg by mouth daily.    Yes Historical Provider, MD    ROS:  Out of a complete 14 system review of symptoms, the patient complains only of the following symptoms, and all other reviewed systems  are negative.  Hearing loss, ringing in the ears Achy muscles, muscle cramps Numbness, weakness, burning sensations  Blood pressure 121/68, pulse (!) 42, height 5' 7.5" (1.715 m), weight 122 lb 8 oz (55.6 kg).  Physical Exam  General: The patient is alert and cooperative at the time of the examination.  Skin: No significant peripheral edema is noted.   Neurologic Exam  Mental status: The patient is alert and oriented x 3 at the time of the examination. The patient has apparent normal recent and remote memory, with an apparently normal attention span and concentration ability.   Cranial nerves: Facial  symmetry is present. Speech is normal, no aphasia or dysarthria is noted. Extraocular movements are full. Visual fields are full.  Motor: The patient has good strength in all 4 extremities.  Sensory examination: Soft touch sensation is symmetric on the face, arms, and legs.  Coordination: The patient has good finger-nose-finger and heel-to-shin bilaterally.  Gait and station: The patient has a normal gait. Tandem gait is normal. Romberg is negative. No drift is seen.  Reflexes: Deep tendon reflexes are symmetric.   Assessment/Plan:  1. Sensory paresthesias, etiology unclear  We will recheck some blood work today. The patient will follow-up in one year. Prescriptions were sent in for gabapentin and Cymbalta.  Jill Alexanders MD 11/20/2015 4:54 PM  [facility] [address] [place], [place] [address]  Phone [phone] Fax [phone]

## 2015-11-25 ENCOUNTER — Other Ambulatory Visit: Payer: Self-pay | Admitting: Oncology

## 2015-11-25 DIAGNOSIS — Z170 Estrogen receptor positive status [ER+]: Principal | ICD-10-CM

## 2015-11-25 DIAGNOSIS — C50211 Malignant neoplasm of upper-inner quadrant of right female breast: Secondary | ICD-10-CM

## 2015-11-25 NOTE — Progress Notes (Signed)
++  Jeanette Boone's WBC were a buit low last visit-- will repeat 01/10/16

## 2015-12-15 ENCOUNTER — Other Ambulatory Visit (INDEPENDENT_AMBULATORY_CARE_PROVIDER_SITE_OTHER): Payer: Self-pay

## 2015-12-15 DIAGNOSIS — Z0289 Encounter for other administrative examinations: Secondary | ICD-10-CM

## 2015-12-15 DIAGNOSIS — G549 Nerve root and plexus disorder, unspecified: Secondary | ICD-10-CM

## 2015-12-18 LAB — RHEUMATOID FACTOR: Rhuematoid fact SerPl-aCnc: 19.9 IU/mL — ABNORMAL HIGH (ref 0.0–13.9)

## 2015-12-18 LAB — B. BURGDORFI ANTIBODIES

## 2015-12-18 LAB — C-REACTIVE PROTEIN

## 2015-12-18 LAB — SEDIMENTATION RATE: SED RATE: 3 mm/h (ref 0–40)

## 2016-01-10 ENCOUNTER — Ambulatory Visit (HOSPITAL_BASED_OUTPATIENT_CLINIC_OR_DEPARTMENT_OTHER): Payer: Managed Care, Other (non HMO) | Admitting: Genetic Counselor

## 2016-01-10 ENCOUNTER — Encounter: Payer: Self-pay | Admitting: Genetic Counselor

## 2016-01-10 ENCOUNTER — Other Ambulatory Visit (HOSPITAL_BASED_OUTPATIENT_CLINIC_OR_DEPARTMENT_OTHER): Payer: Managed Care, Other (non HMO)

## 2016-01-10 DIAGNOSIS — Z8052 Family history of malignant neoplasm of bladder: Secondary | ICD-10-CM | POA: Diagnosis not present

## 2016-01-10 DIAGNOSIS — Z170 Estrogen receptor positive status [ER+]: Principal | ICD-10-CM

## 2016-01-10 DIAGNOSIS — C50211 Malignant neoplasm of upper-inner quadrant of right female breast: Secondary | ICD-10-CM

## 2016-01-10 DIAGNOSIS — Z803 Family history of malignant neoplasm of breast: Secondary | ICD-10-CM | POA: Diagnosis not present

## 2016-01-10 DIAGNOSIS — Z315 Encounter for genetic counseling: Secondary | ICD-10-CM | POA: Diagnosis not present

## 2016-01-10 LAB — CBC WITH DIFFERENTIAL/PLATELET
BASO%: 0.8 % (ref 0.0–2.0)
Basophils Absolute: 0 10*3/uL (ref 0.0–0.1)
EOS%: 2.9 % (ref 0.0–7.0)
Eosinophils Absolute: 0.1 10*3/uL (ref 0.0–0.5)
HEMATOCRIT: 32.8 % — AB (ref 34.8–46.6)
HGB: 10.9 g/dL — ABNORMAL LOW (ref 11.6–15.9)
LYMPH#: 1.6 10*3/uL (ref 0.9–3.3)
LYMPH%: 42.2 % (ref 14.0–49.7)
MCH: 32 pg (ref 25.1–34.0)
MCHC: 33.2 g/dL (ref 31.5–36.0)
MCV: 96.2 fL (ref 79.5–101.0)
MONO#: 0.2 10*3/uL (ref 0.1–0.9)
MONO%: 5.9 % (ref 0.0–14.0)
NEUT%: 48.2 % (ref 38.4–76.8)
NEUTROS ABS: 1.8 10*3/uL (ref 1.5–6.5)
PLATELETS: 172 10*3/uL (ref 145–400)
RBC: 3.41 10*6/uL — ABNORMAL LOW (ref 3.70–5.45)
RDW: 12.4 % (ref 11.2–14.5)
WBC: 3.7 10*3/uL — ABNORMAL LOW (ref 3.9–10.3)

## 2016-01-10 LAB — CHCC SMEAR

## 2016-01-10 NOTE — Progress Notes (Signed)
REFERRING PROVIDER: Ann Held, DO [address] [place], [place] [address]   Jeanette Del, MD  PRIMARY PROVIDER:  Ann Held, DO  PRIMARY REASON FOR VISIT:  1. Malignant neoplasm of upper-inner quadrant of right breast in female, estrogen receptor positive ([place])   2. Family history of breast cancer   3. Family history of bladder cancer      HISTORY OF PRESENT ILLNESS:   Jeanette Boone, a 56 y.o. female, was seen for a [facility] cancer genetics consultation at the request of Dr. Jana Hakim due to a personal and family history of cancer.  Jeanette Boone presents to clinic today to discuss the possibility of a hereditary predisposition to cancer, genetic testing, and to further clarify her future cancer risks, as well as potential cancer risks for family members.   In 2010, at the age of 23, Jeanette Boone was diagnosed with invasive ductal carcinoma of the right breast. The tumor was ER-/PR-/Her2 +. This was treated with lumpectomy, chemotherapy and radiation.  She underwent genetic testing for BRCA mutations through [facility] in 2010 and was found to be negative.   CANCER HISTORY:   No history exists.     HORMONAL RISK FACTORS:  Menarche was at age 76.  First live birth at age N/A.  OCP use for approximately 10-15 years.  Ovaries intact: yes.  Hysterectomy: no.  Menopausal status: postmenopausal.  HRT use: 0 years. Colonoscopy: yes; normal. Mammogram within the last year: yes. Number of breast biopsies: 1. Up to date with pelvic exams:  yes. Any excessive radiation exposure in the past:  Just for breast cancer treatment  Past Medical History:  Diagnosis Date  . Cancer (HCC)    breast  . Chronic leg pain   . Endometriosis   . Family history of bladder cancer   . Family history of breast cancer   . Obesity   . Paresthesias    Generalized  . 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 Thrombocytopenia, secondary    secondary to valtrex    Past  Surgical History:  Procedure Laterality Date  . BREAST SURGERY  05/2008   lumpectomy - right  . CHOLECYSTECTOMY  05/2008  . COLONOSCOPY  08/2009  . HERNIA REPAIR  [id number]   umbilical  . HERNIA REPAIR  1996  . LAPAROSCOPY    . port a cath insertion  06/2008  . uterine polyp removal  06/2008    Social History   Social History  . Marital status: Married    Spouse name: donald  . Number of children: 0  . Years of education: college   Occupational History  . [employer]   Social History Main Topics  . Smoking status: Former Research scientist (life sciences)  . Smokeless tobacco: Never Used  . Alcohol use Yes     Comment: 1 or 2 per week  . Drug use:     Types: Methaqualone  . Sexual activity: Yes    Partners: Male   Other Topics Concern  . None   Social History Narrative   Exercise-- no      Patient is right handed.   Patient does not drink caffeine.     FAMILY HISTORY:  We obtained a detailed, 4-generation family history.  Significant diagnoses are listed below: Family History  Problem Relation Age of Onset  . Heart disease Father     pacemaker----tachycardia  . Stroke Maternal Grandmother   . Bladder Cancer Maternal Grandmother  dx in her 88s  . Heart disease Maternal Grandfather 11    MI  . Stroke Maternal Grandfather   . Heart disease Paternal Grandfather 33    MI  . Hypertension Mother   . Breast cancer Other     MGF's sister    Jeanette Boone doe not have biological children, but has a son and daughter who are adopted and healthy.  She has two brothers and a sister who are all cancer free.  Both parents are alive and well.  They are both only children.  Jeanette Boone' maternal grandmother had bladder cancer and died at 74.  Her grandfather passed away at 64 from non cancer related issues.  He had a sister with breast cancer.  Jeanette Boone' paternal grandfather died at 10 and his paternal grandmother died at 79 from non cancer related issues.    Jeanette Boone had genetic  testing in 2010 for BRCA mutations that was negative.. Patient's maternal ancestors are of Caucasian descent, and paternal ancestors are of Caucasian descent. There is no reported Ashkenazi Jewish ancestry. There is no known consanguinity.  GENETIC COUNSELING ASSESSMENT: Jeanette Boone is a 56 y.o. female with a personal and family history of breast cancer which is somewhat suggestive of a hereditary cancer syndrome and predisposition to cancer. We, therefore, discussed and recommended the following at today's visit.   DISCUSSION: We discussed that about 5-10% of breast cancer is hereditary with most cases due to BRCA mutations.  Understanding that she was negative in the past for BRCA mutations, much of her risk has been tested for.  However, there are mutations that we can identify now that we could not identify in the past, and therefore we should consider redoing this testing.  We disucssed that other genes outside of BRCA can increase the risk for breast cancer, including ATM, CHEK2 and PALB2.  We reviewed the characteristics, features and inheritance patterns of hereditary cancer syndromes. We also discussed genetic testing, including the appropriate family members to test, the process of testing, insurance coverage and turn-around-time for results. We discussed the implications of a negative, positive and/or variant of uncertain significant result. We recommended Jeanette Boone pursue genetic testing for the [facility] gene panel. The [facility] gene panel offered by [employer] includes sequencing and deletion/duplication testing of the following 28 genes: APC, ATM, BARD1, BMPR1A, BRCA1, BRCA2, BRIP1, CHD1, CDK4, CDKN2A, CHEK2, EPCAM (large rearrangement only), MLH1, MSH2, MSH6, MUTYH, NBN, PALB2, PMS2, PTEN, RAD51C, RAD51D, SMAD4, STK11, and TP53. Sequencing was performed for select regions of POLE and POLD1, and large rearrangement analysis was performed for select regions of GREM1.   Based on  Jeanette Boone's personal and family history of cancer, she meets medical criteria for genetic testing. Despite that she meets criteria, she may still have an out of pocket cost. We discussed that if her out of pocket cost for testing is over $100, the laboratory will call and confirm whether she wants to proceed with testing.  If the out of pocket cost of testing is less than $100 she will be billed by the genetic testing laboratory.   PLAN: After considering the risks, benefits, and limitations, Jeanette Boone  provided informed consent to pursue genetic testing and the blood sample was sent to [facility] for analysis of the [facility] panel. Results should be available within approximately 2-3 weeks' time, at which point they will be disclosed by telephone to Jeanette Boone, as will any additional recommendations warranted by these results. Ms.  Boone will receive a summary of her genetic counseling visit and a copy of her results once available. This information will also be available in Epic. We encouraged Jeanette Boone to remain in contact with cancer genetics annually so that we can continuously update the family history and inform her of any changes in cancer genetics and testing that may be of benefit for her family. Jeanette Boone questions were answered to her satisfaction today. Our contact information was provided should additional questions or concerns arise.  Lastly, we encouraged Jeanette Boone to remain in contact with cancer genetics annually so that we can continuously update the family history and inform her of any changes in cancer genetics and testing that may be of benefit for this family.   Ms.  Boone questions were answered to her satisfaction today. Our contact information was provided should additional questions or concerns arise. Thank you for the referral and allowing [place] to share in the care of your patient.   Areliz Rothman P. Florene Glen, [place], [facility] Certified Genetic Counselor [email or web address] phone:  [phone]  The patient was seen for a total of 30 minutes in face-to-face genetic counseling.  This patient was discussed with Drs. Magrinat, Lindi Adie and/or Burr Medico who agrees with the above.    _______________________________________________________________________ For Office Staff:  Number of people involved in session: 1 Was an Intern/ student involved with case: no

## 2016-01-25 ENCOUNTER — Encounter: Payer: Self-pay | Admitting: Genetic Counselor

## 2016-01-25 DIAGNOSIS — Z1379 Encounter for other screening for genetic and chromosomal anomalies: Secondary | ICD-10-CM | POA: Insufficient documentation

## 2016-01-25 DIAGNOSIS — Z0000 Encounter for general adult medical examination without abnormal findings: Secondary | ICD-10-CM | POA: Insufficient documentation

## 2016-01-26 ENCOUNTER — Telehealth: Payer: Self-pay | Admitting: Genetic Counselor

## 2016-01-26 NOTE — Telephone Encounter (Signed)
Revealed negative genetic testing on the [facility] panel.  Discussed limitations of testing.  Patient voiced understanding.

## 2016-01-26 NOTE — Telephone Encounter (Signed)
LM on VM with good news.  Asked that she CB. 

## 2016-02-05 ENCOUNTER — Encounter: Payer: Self-pay | Admitting: Oncology

## 2016-02-05 ENCOUNTER — Ambulatory Visit: Payer: Self-pay | Admitting: Genetic Counselor

## 2016-02-05 DIAGNOSIS — Z8052 Family history of malignant neoplasm of bladder: Secondary | ICD-10-CM

## 2016-02-05 DIAGNOSIS — C50211 Malignant neoplasm of upper-inner quadrant of right female breast: Secondary | ICD-10-CM

## 2016-02-05 DIAGNOSIS — Z170 Estrogen receptor positive status [ER+]: Secondary | ICD-10-CM

## 2016-02-05 DIAGNOSIS — Z1379 Encounter for other screening for genetic and chromosomal anomalies: Secondary | ICD-10-CM

## 2016-02-05 DIAGNOSIS — Z803 Family history of malignant neoplasm of breast: Secondary | ICD-10-CM

## 2016-02-05 NOTE — Progress Notes (Signed)
HPI: Jeanette Boone was previously seen in the [facility] due to a personal and family history of cancer and concerns regarding a hereditary predisposition to cancer. Please refer to our prior cancer genetics clinic note for more information regarding Jeanette Boone's medical, social and family histories, and our assessment and recommendations, at the time. Jeanette Boone recent genetic test results were disclosed to her, as were recommendations warranted by these results. These results and recommendations are discussed in more detail below.  CANCER HISTORY:    Breast cancer of upper-inner quadrant of right female breast ([place])   10/01/2012 Initial Diagnosis    Breast cancer of upper-inner quadrant of right female breast ([place])     01/24/2016 Genetic Testing    Patient has genetic testing done for a personal history of breast cancer. Negative genetic testing on the [facility] cancer panel.  The [facility] gene panel offered by [employer] includes sequencing and deletion/duplication testing of the following 28 genes: APC, ATM, BARD1, BMPR1A, BRCA1, BRCA2, BRIP1, CHD1, CDK4, CDKN2A, CHEK2, EPCAM (large rearrangement only), MLH1, MSH2, MSH6, MUTYH, NBN, PALB2, PMS2, PTEN, RAD51C, RAD51D, SMAD4, STK11, and TP53. Sequencing was performed for select regions of POLE and POLD1, and large rearrangement analysis was performed for select regions of GREM1.        FAMILY HISTORY:  We obtained a detailed, 4-generation family history.  Significant diagnoses are listed below: Family History  Problem Relation Age of Onset  . Heart disease Father     pacemaker----tachycardia  . Stroke Maternal Grandmother   . Bladder Cancer Maternal Grandmother     dx in her 15s  . Heart disease Maternal Grandfather 8    MI  . Stroke Maternal Grandfather   . Heart disease Paternal Grandfather 63    MI  . Hypertension Mother   . Breast cancer Other     MGF's sister    Jeanette Boone does not have biological  children, but has a son and daughter who are adopted and healthy.  She has two brothers and a sister who are all cancer free.  Both parents are alive and well.  They are both only children.  Jeanette Boone' maternal grandmother had bladder cancer and died at 59.  Her grandfather passed away at 12 from non cancer related issues.  He had a sister with breast cancer.  Jeanette Boone' paternal grandfather died at 61 and his paternal grandmother died at 29 from non cancer related issues.    Jeanette Boone had genetic testing in 2010 for BRCA mutations that was negative.. Patient's maternal ancestors are of Caucasian descent, and paternal ancestors are of Caucasian descent. There is no reported Ashkenazi Jewish ancestry. There is no known consanguinity.  GENETIC TEST RESULTS: Genetic testing reported out on January 24, 2016 through the [facility] cancer panel found no deleterious mutations.  The [facility] gene panel offered by [employer] includes sequencing and deletion/duplication testing of the following 28 genes: APC, ATM, BARD1, BMPR1A, BRCA1, BRCA2, BRIP1, CHD1, CDK4, CDKN2A, CHEK2, EPCAM (large rearrangement only), MLH1, MSH2, MSH6, MUTYH, NBN, PALB2, PMS2, PTEN, RAD51C, RAD51D, SMAD4, STK11, and TP53. Sequencing was performed for select regions of POLE and POLD1, and large rearrangement analysis was performed for select regions of GREM1.   The test report has been scanned into EPIC and is located under the Molecular Pathology section of the Results Review tab.   We discussed with Jeanette Boone that since the current genetic testing is not perfect, it is possible  there may be a gene mutation in one of these genes that current testing cannot detect, but that chance is small. We also discussed, that it is possible that another gene that has not yet been discovered, or that we have not yet tested, is responsible for the cancer diagnoses in the family, and it is, therefore, important to remain in touch with cancer  genetics in the future so that we can continue to offer Jeanette Boone the most up to date genetic testing.   CANCER SCREENING RECOMMENDATIONS:  This result is reassuring and indicates that Jeanette Boone likely does not have an increased risk for a future cancer due to a mutation in one of these genes. This normal test also suggests that Jeanette Boone's cancer was most likely not due to an inherited predisposition associated with one of these genes.  Most cancers happen by chance and this negative test suggests that her cancer falls into this category.  We, therefore, recommended she continue to follow the cancer management and screening guidelines provided by her oncology and primary healthcare provider.   RECOMMENDATIONS FOR FAMILY MEMBERS: Women in this family might be at some increased risk of developing cancer, over the general population risk, simply due to the family history of cancer. We recommended women in this family have a yearly mammogram beginning at age 49, or 17 years younger than the earliest onset of cancer, an annual clinical breast exam, and perform monthly breast self-exams. Women in this family should also have a gynecological exam as recommended by their primary provider. All family members should have a colonoscopy by age 106.  FOLLOW-UP: Lastly, we discussed with Jeanette Boone that cancer genetics is a rapidly advancing field and it is possible that new genetic tests will be appropriate for her and/or her family members in the future. We encouraged her to remain in contact with cancer genetics on an annual basis so we can update her personal and family histories and let her know of advances in cancer genetics that may benefit this family.   Our contact number was provided. Jeanette Boone questions were answered to her satisfaction, and she knows she is welcome to call us at anytime with additional questions or concerns.   Roma Kayser, MS, [facility] Certified Genetic Counselor [email or web address]

## 2016-02-06 ENCOUNTER — Telehealth: Payer: Self-pay | Admitting: Oncology

## 2016-02-06 ENCOUNTER — Other Ambulatory Visit: Payer: Self-pay | Admitting: Oncology

## 2016-02-06 ENCOUNTER — Other Ambulatory Visit (HOSPITAL_BASED_OUTPATIENT_CLINIC_OR_DEPARTMENT_OTHER): Payer: Managed Care, Other (non HMO)

## 2016-02-06 DIAGNOSIS — C50211 Malignant neoplasm of upper-inner quadrant of right female breast: Secondary | ICD-10-CM

## 2016-02-06 DIAGNOSIS — Z853 Personal history of malignant neoplasm of breast: Secondary | ICD-10-CM | POA: Diagnosis not present

## 2016-02-06 DIAGNOSIS — R634 Abnormal weight loss: Secondary | ICD-10-CM

## 2016-02-06 DIAGNOSIS — D708 Other neutropenia: Secondary | ICD-10-CM

## 2016-02-06 DIAGNOSIS — Z170 Estrogen receptor positive status [ER+]: Principal | ICD-10-CM

## 2016-02-06 LAB — COMPREHENSIVE METABOLIC PANEL
ALBUMIN: 4.1 g/dL (ref 3.5–5.0)
ALK PHOS: 70 U/L (ref 40–150)
ALT: 62 U/L — ABNORMAL HIGH (ref 0–55)
ANION GAP: 6 meq/L (ref 3–11)
AST: 70 U/L — ABNORMAL HIGH (ref 5–34)
BILIRUBIN TOTAL: 0.5 mg/dL (ref 0.20–1.20)
BUN: 13.3 mg/dL (ref 7.0–26.0)
CALCIUM: 9.3 mg/dL (ref 8.4–10.4)
CO2: 28 mEq/L (ref 22–29)
CREATININE: 0.9 mg/dL (ref 0.6–1.1)
Chloride: 101 mEq/L (ref 98–109)
EGFR: 76 mL/min/{1.73_m2} — AB (ref >90)
Glucose: 85 mg/dl (ref 70–140)
Potassium: 4.4 mEq/L (ref 3.5–5.1)
Sodium: 135 mEq/L — ABNORMAL LOW (ref 136–145)
TOTAL PROTEIN: 6.6 g/dL (ref 6.4–8.3)

## 2016-02-06 LAB — CBC WITH DIFFERENTIAL/PLATELET
BASO%: 1.4 % (ref 0.0–2.0)
Basophils Absolute: 0 10*3/uL (ref 0.0–0.1)
EOS ABS: 0.1 10*3/uL (ref 0.0–0.5)
EOS%: 2 % (ref 0.0–7.0)
HEMATOCRIT: 32.4 % — AB (ref 34.8–46.6)
HEMOGLOBIN: 10.7 g/dL — AB (ref 11.6–15.9)
LYMPH#: 1.6 10*3/uL (ref 0.9–3.3)
LYMPH%: 45.9 % (ref 14.0–49.7)
MCH: 32.5 pg (ref 25.1–34.0)
MCHC: 32.9 g/dL (ref 31.5–36.0)
MCV: 98.6 fL (ref 79.5–101.0)
MONO#: 0.3 10*3/uL (ref 0.1–0.9)
MONO%: 8.5 % (ref 0.0–14.0)
NEUT%: 42.2 % (ref 38.4–76.8)
NEUTROS ABS: 1.5 10*3/uL (ref 1.5–6.5)
PLATELETS: 142 10*3/uL — AB (ref 145–400)
RBC: 3.29 10*6/uL — AB (ref 3.70–5.45)
RDW: 13 % (ref 11.2–14.5)
WBC: 3.4 10*3/uL — AB (ref 3.9–10.3)

## 2016-02-06 NOTE — Telephone Encounter (Signed)
lvm to inform pt of 12/19 and 2/23 appt date/time per LOS

## 2016-02-06 NOTE — Progress Notes (Unsigned)
I am not sure why Elisea's bloodwork continues to be abnormal (anemia, leukopenia, and now her liver function tests also are somewhat elevated. She has lost a considerable amount of weight. I think it would be prudent to do a CT scan of the chest to make sure we don't have anything obvious to deal with and then become a little bit more detailed in the hematology work and make sure were not missing missing something obvious they are. I called her to discuss these options. She is agreeable to all these tests. She will see me in a little over 2 weeks to discuss those results.  This may be helpful at this point to summarize some of her earlier results. She has a persistently positive rheumatoid factor, not high titer, which was evaluated by rheumatology [name]). They found no other evidence of rheumatoid arthritis and a negative CCP antibody panel, concluding this was a false positive test. She has had negative ANA, negative c-ANCA and p-ANCA, negative lupus anticoagulant, negative copper, equivocal Lyme IgG but negative Western blot, normal ACE, negative hepatitis C, and repeatedly normal C-reactive protein and erythrocyte sedimentation rate.  I am going to obtain a ferritin folate B12 myeloma panel, hepatitis B panel and HIV. These results should be ready by the time she returns to see me 03/01/2016.

## 2016-02-10 ENCOUNTER — Other Ambulatory Visit: Payer: Self-pay | Admitting: Neurology

## 2016-02-13 ENCOUNTER — Ambulatory Visit (HOSPITAL_COMMUNITY): Payer: Managed Care, Other (non HMO)

## 2016-02-20 ENCOUNTER — Telehealth: Payer: Self-pay

## 2016-02-20 NOTE — Telephone Encounter (Signed)
LMVOM - returning pt call.  Provided d/t CT appt.  Pt to return call to clinic with any questions.

## 2016-02-22 ENCOUNTER — Ambulatory Visit (HOSPITAL_COMMUNITY): Payer: Managed Care, Other (non HMO)

## 2016-02-26 ENCOUNTER — Other Ambulatory Visit (HOSPITAL_BASED_OUTPATIENT_CLINIC_OR_DEPARTMENT_OTHER): Payer: Managed Care, Other (non HMO)

## 2016-02-26 DIAGNOSIS — D539 Nutritional anemia, unspecified: Secondary | ICD-10-CM | POA: Diagnosis not present

## 2016-02-26 DIAGNOSIS — Z170 Estrogen receptor positive status [ER+]: Principal | ICD-10-CM

## 2016-02-26 DIAGNOSIS — R634 Abnormal weight loss: Secondary | ICD-10-CM

## 2016-02-26 DIAGNOSIS — D708 Other neutropenia: Secondary | ICD-10-CM

## 2016-02-26 DIAGNOSIS — C50211 Malignant neoplasm of upper-inner quadrant of right female breast: Secondary | ICD-10-CM

## 2016-02-26 LAB — CBC WITH DIFFERENTIAL/PLATELET
BASO%: 1.3 % (ref 0.0–2.0)
BASOS ABS: 0 10*3/uL (ref 0.0–0.1)
EOS ABS: 0.1 10*3/uL (ref 0.0–0.5)
EOS%: 2.3 % (ref 0.0–7.0)
HCT: 33.4 % — ABNORMAL LOW (ref 34.8–46.6)
HEMOGLOBIN: 11.1 g/dL — AB (ref 11.6–15.9)
LYMPH%: 46.2 % (ref 14.0–49.7)
MCH: 32.9 pg (ref 25.1–34.0)
MCHC: 33.3 g/dL (ref 31.5–36.0)
MCV: 98.8 fL (ref 79.5–101.0)
MONO#: 0.2 10*3/uL (ref 0.1–0.9)
MONO%: 6.5 % (ref 0.0–14.0)
NEUT%: 43.7 % (ref 38.4–76.8)
NEUTROS ABS: 1.5 10*3/uL (ref 1.5–6.5)
PLATELETS: 176 10*3/uL (ref 145–400)
RBC: 3.38 10*6/uL — ABNORMAL LOW (ref 3.70–5.45)
RDW: 13.4 % (ref 11.2–14.5)
WBC: 3.5 10*3/uL — AB (ref 3.9–10.3)
lymph#: 1.6 10*3/uL (ref 0.9–3.3)

## 2016-02-26 LAB — COMPREHENSIVE METABOLIC PANEL
ALBUMIN: 4.2 g/dL (ref 3.5–5.0)
ALK PHOS: 81 U/L (ref 40–150)
ALT: 69 U/L — ABNORMAL HIGH (ref 0–55)
ANION GAP: 9 meq/L (ref 3–11)
AST: 56 U/L — ABNORMAL HIGH (ref 5–34)
BILIRUBIN TOTAL: 0.48 mg/dL (ref 0.20–1.20)
BUN: 15.2 mg/dL (ref 7.0–26.0)
CO2: 27 mEq/L (ref 22–29)
Calcium: 9.6 mg/dL (ref 8.4–10.4)
Chloride: 102 mEq/L (ref 98–109)
Creatinine: 0.9 mg/dL (ref 0.6–1.1)
EGFR: 75 mL/min/{1.73_m2} — AB (ref >90)
Glucose: 77 mg/dl (ref 70–140)
Potassium: 4 mEq/L (ref 3.5–5.1)
Sodium: 137 mEq/L (ref 136–145)
TOTAL PROTEIN: 7 g/dL (ref 6.4–8.3)

## 2016-02-26 LAB — CHCC SMEAR

## 2016-02-26 LAB — IRON AND TIBC
%SAT: 24 % (ref 21–57)
Iron: 100 ug/dL (ref 41–142)
TIBC: 424 ug/dL (ref 236–444)
UIBC: 324 ug/dL (ref 120–384)

## 2016-02-26 LAB — FERRITIN: Ferritin: 10 ng/ml (ref 9–269)

## 2016-02-27 LAB — HEPATITIS B CORE ANTIBODY, IGM: Hep B Core Ab, IgM: NEGATIVE

## 2016-02-27 LAB — HEPATITIS B SURFACE ANTIGEN: HEP B S AG: NEGATIVE

## 2016-02-27 LAB — VITAMIN B12: Vitamin B12: 1116 pg/mL (ref 232–1245)

## 2016-02-27 LAB — HIV ANTIBODY (ROUTINE TESTING W REFLEX): HIV SCREEN 4TH GENERATION: NONREACTIVE

## 2016-02-28 ENCOUNTER — Ambulatory Visit (HOSPITAL_COMMUNITY)
Admission: RE | Admit: 2016-02-28 | Discharge: 2016-02-28 | Disposition: A | Discharge status: Home / Self Care | Payer: Managed Care, Other (non HMO) | Source: Ambulatory Visit | Attending: Oncology | Admitting: Oncology

## 2016-02-28 ENCOUNTER — Encounter (HOSPITAL_COMMUNITY): Payer: Self-pay | Admitting: Radiology

## 2016-02-28 DIAGNOSIS — C50211 Malignant neoplasm of upper-inner quadrant of right female breast: Secondary | ICD-10-CM | POA: Diagnosis not present

## 2016-02-28 DIAGNOSIS — Z170 Estrogen receptor positive status [ER+]: Secondary | ICD-10-CM | POA: Insufficient documentation

## 2016-02-28 DIAGNOSIS — R634 Abnormal weight loss: Secondary | ICD-10-CM | POA: Diagnosis present

## 2016-02-28 DIAGNOSIS — D708 Other neutropenia: Secondary | ICD-10-CM | POA: Insufficient documentation

## 2016-02-28 MED ORDER — IOPAMIDOL (ISOVUE-300) INJECTION 61%
INTRAVENOUS | Status: AC
  Filled 2016-02-28: qty 75

## 2016-02-28 MED ORDER — SODIUM CHLORIDE 0.9 % IJ SOLN
INTRAMUSCULAR | Status: AC
  Filled 2016-02-28: qty 50

## 2016-02-28 MED ORDER — IOPAMIDOL (ISOVUE-300) INJECTION 61%
75.0000 mL | Freq: Once | INTRAVENOUS | Status: AC | PRN
  Administered 2016-02-28: 75 mL via INTRAVENOUS

## 2016-02-29 ENCOUNTER — Other Ambulatory Visit: Payer: Self-pay | Admitting: Oncology

## 2016-02-29 DIAGNOSIS — D539 Nutritional anemia, unspecified: Secondary | ICD-10-CM

## 2016-02-29 DIAGNOSIS — D509 Iron deficiency anemia, unspecified: Secondary | ICD-10-CM | POA: Insufficient documentation

## 2016-02-29 LAB — MULTIPLE MYELOMA PANEL, SERUM
ALBUMIN/GLOB SERPL: 1.8 — AB (ref 0.7–1.7)
Albumin SerPl Elph-Mcnc: 4.1 g/dL (ref 2.9–4.4)
Alpha 1: 0.2 g/dL (ref 0.0–0.4)
Alpha2 Glob SerPl Elph-Mcnc: 0.5 g/dL (ref 0.4–1.0)
B-Globulin SerPl Elph-Mcnc: 0.8 g/dL (ref 0.7–1.3)
GAMMA GLOB SERPL ELPH-MCNC: 0.9 g/dL (ref 0.4–1.8)
GLOBULIN, TOTAL: 2.4 (ref 2.2–3.9)
IGA/IMMUNOGLOBULIN A, SERUM: 101 mg/dL (ref 87–352)
IGM (IMMUNOGLOBIN M), SRM: 180 mg/dL (ref 26–217)
IgG, Qn, Serum: 857 mg/dL (ref 700–1600)
Total Protein: 6.5 g/dL (ref 6.0–8.5)

## 2016-03-01 ENCOUNTER — Ambulatory Visit (HOSPITAL_BASED_OUTPATIENT_CLINIC_OR_DEPARTMENT_OTHER): Payer: Managed Care, Other (non HMO) | Admitting: Oncology

## 2016-03-01 VITALS — BP 112/69 | HR 45 | Temp 97.7°F | Resp 17 | Wt 119.1 lb

## 2016-03-01 DIAGNOSIS — R740 Nonspecific elevation of levels of transaminase and lactic acid dehydrogenase [LDH]: Secondary | ICD-10-CM

## 2016-03-01 DIAGNOSIS — Z853 Personal history of malignant neoplasm of breast: Secondary | ICD-10-CM

## 2016-03-01 DIAGNOSIS — Z171 Estrogen receptor negative status [ER-]: Principal | ICD-10-CM

## 2016-03-01 DIAGNOSIS — D500 Iron deficiency anemia secondary to blood loss (chronic): Secondary | ICD-10-CM

## 2016-03-01 DIAGNOSIS — E611 Iron deficiency: Secondary | ICD-10-CM

## 2016-03-01 DIAGNOSIS — C50211 Malignant neoplasm of upper-inner quadrant of right female breast: Secondary | ICD-10-CM

## 2016-03-01 NOTE — Progress Notes (Signed)
ID: Floydene Flock   DOB: 1960-08-02  MR#: [id number]  XTK#:[id number]  PCP: Garnet Koyanagi MD GYN: Princess Bruins MD SU: Fanny Skates MD OTHER MD: Morene Antu, Gery Pray, Jari Pigg, Lenor Coffin  CHIEF COMPLAINT: HER-2 positive breast cancer  CURRENT TREATMENT: feraheme   BREAST CANCER HISTORY:  From the original intake note:  She herself palpated a mass in her right breast in 04/2008.  The patient tells me that she had lost approximately 100 pounds by diet and exercise over the past year, and that this made her breasts lumpier feeling.  In any case, she brought this question to Dr. Truddie Hidden Lavoie's attention, and was set up for mammography at [facility] on 04/21/2008.  She had had a mammogram in 09/2007, which had been unremarkable.  The mammogram on 05/01/2008 showed a dense right breast with a suggestion of a possible mass in the upper central breast corresponding to the palpable abnormality.  Ultrasound was more revealing, and did show an irregularly bordered hypoechoic solid mass measuring 1.2 cm.  The patient was brought back for biopsy and clip placement on 04/23, and the pathology from that procedure ([id number] and [id number]) showed an invasive ductal carcinoma which was negative for the estrogen and progesterone receptor at 0 and 0 respectively.  The Ki-67 was 19%, and CISH was amplified with a ratio of 2.22 (this is just above the "indeterminate" range).  Bilateral breast MRIs were obtained on 04/30.  This showed in the right breast an enhancing rounded mass measuring up to 1.8 cm.  The left breast was unremarkable, and there were no enlarged or suspicious internal mammary or axillary nodes on either side.  With this information, the patient proceeded to definitive right lumpectomy and sentinel lymph node sampling on 05/16/2008.  The final pathology ([id number]) confirmed a 1.5 cm invasive ductal carcinoma, grade 2, with negative margins, no evidence of lymphovascular  invasion, and 0 of 3 sentinel lymph nodes involved.  The estrogen receptor test was repeated ([id number]), and was again negative with a strong positive control.  Her subsequent history is as detailed below.   INTERVAL HISTORY:  Talissa returns today for follow-up of her HER-2/neu positive remote breast cancer. Since her last visit here she had a very thorough restaging because she was very concerned about the possibility of recurrence, hepatitis, and other abnormalities. We obtained extensive lab work and a CT scan of the chest.  She was found to be HIV negative, and to have no evidence of hepatitis serology positivity. Her SPEP was normal as well. She had an excellent B-12 level, but for some reason a folate was not obtained. Her ferritin however was low at 10. This is consistent with iron deficiency. That could explain some of her malaise.  The CT scan of the chest was entirely unremarkable  REVIEW OF SYSTEMS:  Cadi has lost a little bit more weight. She is going to the gym 3-4 times a week. She is running 45 miles at a time, she sets. She tells me her job is fine and not stressful, but the home situation is complicated as previously discussed. She continues to have her hyperesthesia and neuropathic pain. This is followed by neurology. Aside from these issues a detailed review of systems today was stable .  PAST MEDICAL HISTORY: Past Medical History:  Diagnosis Date  . Cancer (HCC)    breast  . Chronic leg pain   . Endometriosis   . Obesity   . Paresthesias    Generalized  .  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 Thrombocytopenia, secondary    secondary to valtrex  1. Obesity.  As stated, the patient has been on a very strict 1200-1400 calorie diet and 5 days of exercise for over a year, and tells me she has lost over 100 pounds.  She tells me she has done this in the past, and that she loses weight easily, but also gains weight easily.  She is now a bit below where she would like to be, her  goal being approximately 140 pounds. 2. She had an episode of cholecystitis, and underwent cholecystectomy on 06/01/2008 under Dr. Dalbert Batman.  The pathology there ([id number]) showed only chronic cholecystitis and cholelithiasis with a benign cystic duct lymph node. 3. The patient has a history of endometriosis. 4. Status post umbilical hernia repair. 5. The patient tried for many years to get pregnant and took "large doses of fertility drugs" for about 8 or 9 years before giving up on those attempts.       6.   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          7.  History of poorly understood neurologic symptom of  hyperesthesia, controlled on neurontin    PAST SURGICAL HISTORY: S/p cholecystectomy  FAMILY HISTORY The patient's father died at the age of 20 from causes not quite clear to the patient, but he certainly had significant heart disease.  The patient's mother is alive.  She has a history of bladder cancer.  Both the patient's parents were single children.  The patient has one sister and two brothers.  There is no history of breast cancer in the immediate family.  GYNECOLOGIC HISTORY: She is GX, P0. She was having regular periods at the time of her cancer diagnosis, these were interrupted by chemotherapy, then resumed. Her periods are now irregular. She is having mild hot flashes.  SOCIAL HISTORY: Leoni works as a Paramedic.  Her husband, [name], is an Chief Financial Officer.  Their two children, [name] and [name],  were adopted from [place].  The patient attends the [employer].    ADVANCED DIRECTIVES Not in place    HEALTH MAINTENANCE: Social History  Substance Use Topics  . Smoking status: Former Research scientist (life sciences)  . Smokeless tobacco: Never Used  . Alcohol use Yes     Comment: 1 or 2 per week     Colonoscopy: 2011  PAP: UTD  Bone density:  Lipid panel:  Allergies  Allergen Reactions  . Clarithromycin     Thrush  . Penicillins Other  (See Comments)    Child hood     Current Outpatient Prescriptions  Medication Sig Dispense Refill  . B Complex Vitamins (B-COMPLEX/B-12 PO) Take by mouth.    . Biotin 1000 MCG tablet Take 1,000 mcg by mouth daily.     . cholecalciferol (VITAMIN D) 1000 UNITS tablet Take 2,000 Units by mouth daily.    . DULoxetine (CYMBALTA) 30 MG capsule Take 1 capsule (30 mg total) by mouth daily. 90 capsule 3  . gabapentin (NEURONTIN) 300 MG capsule Take 3 capsules (900 mg total) by mouth 4 (four) times daily. 1080 capsule 3  . Multiple Vitamin (MULTIVITAMIN) tablet Take 1 tablet by mouth daily.    . valACYclovir (VALTREX) 500 MG tablet Take 500 mg by mouth daily.      No current facility-administered medications for this visit.     OBJECTIVE: Middle-aged white woman In no acute distress Vitals:   03/01/16 1117  BP: 112/69  Pulse: (!) 45  Resp: 17  Temp: 97.7 F (36.5 C)  TempSrc: Oral  SpO2: 100%  Weight: 119 lb 1.6 oz (54 kg)   Sclerae unicteric, pupils round and equal Oropharynx clear and moist-- no thrush or other lesions No cervical or supraclavicular adenopathy Lungs no rales or rhonchi Heart regular rate and rhythm Abd soft, nontender, positive bowel sounds MSK no focal spinal tenderness, no upper extremity lymphedema Neuro: nonfocal, well oriented, appropriate affect Breasts: The right breast has undergone lumpectomy followed by radiation with no evidence of local recurrence. The left breast is benign. Both axillae are benign.  LAB RESULTS: CBC    Component Value Date/Time   WBC 3.5 (L) 02/26/2016 1322   WBC 3.9 (L) 09/07/2015 0825   RBC 3.38 (L) 02/26/2016 1322   RBC 3.66 (L) 09/07/2015 0825   HGB 11.1 (L) 02/26/2016 1322   HCT 33.4 (L) 02/26/2016 1322   PLT 176 02/26/2016 1322   MCV 98.8 02/26/2016 1322   MCH 32.9 02/26/2016 1322   MCH 33.4 01/17/2009 0933   MCHC 33.3 02/26/2016 1322   MCHC 33.8 09/07/2015 0825   RDW 13.4 02/26/2016 1322   LYMPHSABS 1.6 02/26/2016  1322   MONOABS 0.2 02/26/2016 1322   EOSABS 0.1 02/26/2016 1322   BASOSABS 0.0 02/26/2016 1322    CMP Latest Ref Rng & Units 02/26/2016 02/26/2016 02/06/2016  Glucose 70 - 140 mg/dl 77 - 85  BUN 7.0 - 26.0 mg/dL 15.2 - 13.3  Creatinine 0.6 - 1.1 mg/dL 0.9 - 0.9  Sodium 136 - 145 mEq/L 137 - 135(L)  Potassium 3.5 - 5.1 mEq/L 4.0 - 4.4  Chloride 96 - 112 mEq/L - - -  CO2 22 - 29 mEq/L 27 - 28  Calcium 8.4 - 10.4 mg/dL 9.6 - 9.3  Total Protein 6.4 - 8.3 g/dL 7.0 6.5 6.6  Total Bilirubin 0.20 - 1.20 mg/dL 0.48 - 0.50  Alkaline Phos 40 - 150 U/L 81 - 70  AST 5 - 34 U/L 56(H) - 70(H)  ALT 0 - 55 U/L 69(H) - 62(H)      STUDIES: Ct Chest W Contrast  Result Date: 02/28/2016 CLINICAL DATA:  Breast cancer. EXAM: CT CHEST WITH CONTRAST TECHNIQUE: Multidetector CT imaging of the chest was performed during intravenous contrast administration. CONTRAST:  68m ISOVUE-300 IOPAMIDOL (ISOVUE-300) INJECTION 61% COMPARISON:  None FINDINGS: Cardiovascular: The heart size appears normal. There is no pericardial effusion identified. Mediastinum/Nodes: The trachea appears patent and is midline. Normal appearance of the esophagus. No enlarged mediastinal or hilar lymph nodes. Lungs/Pleura: Pleural-parenchymal scarring within the anterior right upper lobe and right middle lobe identified. Right upper lobe measuring 3 mm, image 42 of series 7. No suspicious pulmonary nodules or masses identified. Upper Abdomen: No acute abnormality.  Previous cholecystectomy. Musculoskeletal: No aggressive lytic or sclerotic bone lesions. IMPRESSION: 1. No acute cardiopulmonary abnormalities. 2. No mass or adenopathy identified. Electronically Signed   By: TKerby MoorsM.D.   On: 02/28/2016 16:04     ASSESSMENT: 56y.o.  BRCA negative [place] woman  (1) status post right upper inner quadrant lumpectomy and sentinel lymph node dissection May 2010 for a T1c N0, stage 1A invasive ductal carcinoma,  grade 2,  estrogen and  progesterone receptor negative, but HER-2/neu positive.  The MIB-1 was 19%.   (2) received 4 cycles of docetaxel/carboplatin/trastuzumab adjuvantly  (3) she continued Herceptin to July 2010 with echocardiogram showing a well-preserved ejection fraction after the completion of treatment.  (4) she  completed radiation in December 2010.   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 Sequencing was performed for select regions of POLE and POLD1, and large rearrangement analysis was performed for select regions of GREM1.   PLAN:  I spent approximately 30 minutes with Katharine Look with most of that time spent discussing her complex problems. Lovey Newcomer was relieved that the CT scan of the chest, which also looks through most of her liver, did not show any evidence of abnormality I think the mild elevation in her transaminases, which is not progressive, may be due to either fatty liver, although perhaps that is less likely now that she has lost a little bit of weight and is exercising so vigorously, or at 2 some medication or supplement that she is taking. This really requires only follow-up.   We reviewed her B12 and hepatitis studies as well as her HIV and SPEP and all those were benign. The one significant finding is the ferritin which was low at 10. This does require replacement.  We discussed oral iron replacement. She has tried this in the past but has not been able to take it because it upsets her stomach and constipates her severely. We discussed Feraheme and she understands that while this is generally very well tolerated it can cause very severe  reactions and even death rarely. She is very motivated to receive this. We have tentatively scheduled her for March 9 and March 16 2 receive Feraheme here.  For some reason we did not obtain a folate. I will add that to her March labs.  She will then see me again in May. We will repeat a ferritin at that time as well as her baseline studies with a particular focus on her liver function tests  She knows to call for any other problems that may develop before her next visit here.     Damarrion Mimbs C    09/18/2015

## 2016-03-15 ENCOUNTER — Ambulatory Visit (HOSPITAL_BASED_OUTPATIENT_CLINIC_OR_DEPARTMENT_OTHER): Payer: Managed Care, Other (non HMO)

## 2016-03-15 VITALS — BP 101/60 | HR 49 | Temp 97.8°F | Resp 18

## 2016-03-15 DIAGNOSIS — E611 Iron deficiency: Secondary | ICD-10-CM

## 2016-03-15 DIAGNOSIS — D500 Iron deficiency anemia secondary to blood loss (chronic): Secondary | ICD-10-CM

## 2016-03-15 MED ORDER — SODIUM CHLORIDE 0.9 % IV SOLN
510.0000 mg | Freq: Once | INTRAVENOUS | Status: AC
  Administered 2016-03-15: 510 mg via INTRAVENOUS
  Filled 2016-03-15: qty 17

## 2016-03-15 MED ORDER — SODIUM CHLORIDE 0.9 % IV SOLN
Freq: Once | INTRAVENOUS | Status: AC
  Administered 2016-03-15: 09:00:00 via INTRAVENOUS

## 2016-03-15 NOTE — Patient Instructions (Signed)

## 2016-03-22 ENCOUNTER — Ambulatory Visit (HOSPITAL_BASED_OUTPATIENT_CLINIC_OR_DEPARTMENT_OTHER): Payer: Managed Care, Other (non HMO)

## 2016-03-22 ENCOUNTER — Other Ambulatory Visit (HOSPITAL_BASED_OUTPATIENT_CLINIC_OR_DEPARTMENT_OTHER): Payer: Managed Care, Other (non HMO)

## 2016-03-22 VITALS — BP 100/66 | HR 43 | Temp 98.6°F | Resp 16

## 2016-03-22 DIAGNOSIS — Z171 Estrogen receptor negative status [ER-]: Secondary | ICD-10-CM

## 2016-03-22 DIAGNOSIS — D509 Iron deficiency anemia, unspecified: Secondary | ICD-10-CM

## 2016-03-22 DIAGNOSIS — Z853 Personal history of malignant neoplasm of breast: Secondary | ICD-10-CM

## 2016-03-22 DIAGNOSIS — C50211 Malignant neoplasm of upper-inner quadrant of right female breast: Secondary | ICD-10-CM

## 2016-03-22 DIAGNOSIS — D500 Iron deficiency anemia secondary to blood loss (chronic): Secondary | ICD-10-CM

## 2016-03-22 LAB — COMPREHENSIVE METABOLIC PANEL
ALBUMIN: 4.3 g/dL (ref 3.5–5.0)
ALK PHOS: 79 U/L (ref 40–150)
ALT: 56 U/L — ABNORMAL HIGH (ref 0–55)
ANION GAP: 8 meq/L (ref 3–11)
AST: 47 U/L — ABNORMAL HIGH (ref 5–34)
BILIRUBIN TOTAL: 0.56 mg/dL (ref 0.20–1.20)
BUN: 17.8 mg/dL (ref 7.0–26.0)
CALCIUM: 10.2 mg/dL (ref 8.4–10.4)
CO2: 29 meq/L (ref 22–29)
CREATININE: 1 mg/dL (ref 0.6–1.1)
Chloride: 105 mEq/L (ref 98–109)
EGFR: 60 mL/min/{1.73_m2} — AB (ref >90)
GLUCOSE: 94 mg/dL (ref 70–140)
Potassium: 4.3 mEq/L (ref 3.5–5.1)
SODIUM: 141 meq/L (ref 136–145)
Total Protein: 7.3 g/dL (ref 6.4–8.3)

## 2016-03-22 LAB — CBC WITH DIFFERENTIAL/PLATELET
BASO%: 0.8 % (ref 0.0–2.0)
Basophils Absolute: 0 10*3/uL (ref 0.0–0.1)
EOS%: 3.9 % (ref 0.0–7.0)
Eosinophils Absolute: 0.1 10*3/uL (ref 0.0–0.5)
HEMATOCRIT: 38.4 % (ref 34.8–46.6)
HEMOGLOBIN: 12.7 g/dL (ref 11.6–15.9)
LYMPH%: 35.9 % (ref 14.0–49.7)
MCH: 32.6 pg (ref 25.1–34.0)
MCHC: 33.1 g/dL (ref 31.5–36.0)
MCV: 98.7 fL (ref 79.5–101.0)
MONO#: 0.3 10*3/uL (ref 0.1–0.9)
MONO%: 7.8 % (ref 0.0–14.0)
NEUT#: 1.8 10*3/uL (ref 1.5–6.5)
NEUT%: 51.6 % (ref 38.4–76.8)
Platelets: 166 10*3/uL (ref 145–400)
RBC: 3.89 10*6/uL (ref 3.70–5.45)
RDW: 13 % (ref 11.2–14.5)
WBC: 3.6 10*3/uL — ABNORMAL LOW (ref 3.9–10.3)
lymph#: 1.3 10*3/uL (ref 0.9–3.3)

## 2016-03-22 MED ORDER — SODIUM CHLORIDE 0.9 % IV SOLN
Freq: Once | INTRAVENOUS | Status: AC
  Administered 2016-03-22: 09:00:00 via INTRAVENOUS

## 2016-03-22 MED ORDER — FERUMOXYTOL INJECTION 510 MG/17 ML
510.0000 mg | Freq: Once | INTRAVENOUS | Status: AC
  Administered 2016-03-22: 510 mg via INTRAVENOUS
  Filled 2016-03-22: qty 17

## 2016-03-22 NOTE — Patient Instructions (Signed)

## 2016-03-25 LAB — FOLATE RBC
Folate, Hemolysate: 584.5 ng/mL
Folate, RBC: 1491 ng/mL (ref >498)
Hematocrit: 39.2 % (ref 34.0–46.6)

## 2016-05-14 ENCOUNTER — Telehealth: Payer: Self-pay | Admitting: *Deleted

## 2016-05-14 NOTE — Telephone Encounter (Signed)
Pt called requesting refill on valacyclovir 500 mg tablet, has appointment with Dr.Lavoie. Pt chart is not here at office, pt said she will have chart tomorrow and will bring to me so refill can be sent to pharmacy.

## 2016-05-17 ENCOUNTER — Encounter: Payer: 59 | Admitting: Obstetrics & Gynecology

## 2016-05-23 ENCOUNTER — Encounter: Payer: 59 | Admitting: Obstetrics & Gynecology

## 2016-05-30 ENCOUNTER — Other Ambulatory Visit (HOSPITAL_BASED_OUTPATIENT_CLINIC_OR_DEPARTMENT_OTHER): Payer: Managed Care, Other (non HMO)

## 2016-05-30 DIAGNOSIS — D500 Iron deficiency anemia secondary to blood loss (chronic): Secondary | ICD-10-CM

## 2016-05-30 DIAGNOSIS — C50211 Malignant neoplasm of upper-inner quadrant of right female breast: Secondary | ICD-10-CM

## 2016-05-30 DIAGNOSIS — Z853 Personal history of malignant neoplasm of breast: Secondary | ICD-10-CM

## 2016-05-30 DIAGNOSIS — E611 Iron deficiency: Secondary | ICD-10-CM

## 2016-05-30 DIAGNOSIS — Z171 Estrogen receptor negative status [ER-]: Secondary | ICD-10-CM

## 2016-05-30 LAB — COMPREHENSIVE METABOLIC PANEL
ALBUMIN: 4.1 g/dL (ref 3.5–5.0)
ALK PHOS: 72 U/L (ref 40–150)
ALT: 65 U/L — ABNORMAL HIGH (ref 0–55)
AST: 63 U/L — ABNORMAL HIGH (ref 5–34)
Anion Gap: 6 mEq/L (ref 3–11)
BUN: 15.3 mg/dL (ref 7.0–26.0)
CHLORIDE: 102 meq/L (ref 98–109)
CO2: 29 mEq/L (ref 22–29)
Calcium: 9.4 mg/dL (ref 8.4–10.4)
Creatinine: 0.9 mg/dL (ref 0.6–1.1)
EGFR: 71 mL/min/{1.73_m2} — AB (ref >90)
GLUCOSE: 63 mg/dL — AB (ref 70–140)
POTASSIUM: 4.8 meq/L (ref 3.5–5.1)
SODIUM: 137 meq/L (ref 136–145)
Total Bilirubin: 0.47 mg/dL (ref 0.20–1.20)
Total Protein: 6.6 g/dL (ref 6.4–8.3)

## 2016-05-30 LAB — CBC WITH DIFFERENTIAL/PLATELET
BASO%: 1.3 % (ref 0.0–2.0)
BASOS ABS: 0 10*3/uL (ref 0.0–0.1)
EOS ABS: 0.1 10*3/uL (ref 0.0–0.5)
EOS%: 3.1 % (ref 0.0–7.0)
HCT: 37.8 % (ref 34.8–46.6)
HEMOGLOBIN: 12.6 g/dL (ref 11.6–15.9)
LYMPH%: 39.9 % (ref 14.0–49.7)
MCH: 33.9 pg (ref 25.1–34.0)
MCHC: 33.3 g/dL (ref 31.5–36.0)
MCV: 101.8 fL — AB (ref 79.5–101.0)
MONO#: 0.2 10*3/uL (ref 0.1–0.9)
MONO%: 6.5 % (ref 0.0–14.0)
NEUT#: 1.6 10*3/uL (ref 1.5–6.5)
NEUT%: 49.2 % (ref 38.4–76.8)
Platelets: 140 10*3/uL — ABNORMAL LOW (ref 145–400)
RBC: 3.71 10*6/uL (ref 3.70–5.45)
RDW: 12.8 % (ref 11.2–14.5)
WBC: 3.2 10*3/uL — ABNORMAL LOW (ref 3.9–10.3)
lymph#: 1.3 10*3/uL (ref 0.9–3.3)

## 2016-05-30 LAB — FERRITIN: FERRITIN: 94 ng/mL (ref 9–269)

## 2016-06-04 ENCOUNTER — Ambulatory Visit (HOSPITAL_BASED_OUTPATIENT_CLINIC_OR_DEPARTMENT_OTHER): Payer: Managed Care, Other (non HMO) | Admitting: Oncology

## 2016-06-04 VITALS — BP 118/72 | HR 42 | Temp 98.0°F | Resp 18 | Ht 67.5 in | Wt 122.3 lb

## 2016-06-04 DIAGNOSIS — R740 Nonspecific elevation of levels of transaminase and lactic acid dehydrogenase [LDH]: Secondary | ICD-10-CM | POA: Diagnosis not present

## 2016-06-04 DIAGNOSIS — Z170 Estrogen receptor positive status [ER+]: Secondary | ICD-10-CM | POA: Diagnosis not present

## 2016-06-04 DIAGNOSIS — C50211 Malignant neoplasm of upper-inner quadrant of right female breast: Secondary | ICD-10-CM | POA: Diagnosis not present

## 2016-06-04 DIAGNOSIS — E611 Iron deficiency: Secondary | ICD-10-CM | POA: Diagnosis not present

## 2016-06-04 NOTE — Progress Notes (Signed)
ID: Floydene Flock   DOB: 27-Apr-1960  MR#: [id number]  WUJ#:[id number]  PCP: Garnet Koyanagi MD GYN: Princess Bruins MD SU: Fanny Skates MD OTHER MD: Morene Antu, Gery Pray, Jari Pigg, Lenor Coffin  CHIEF COMPLAINT: HER-2 positive breast cancer  CURRENT TREATMENT: Observation   BREAST CANCER HISTORY:  From the original intake note:  She herself palpated a mass in her right breast in 04/2008.  The patient tells me that she had lost approximately 100 pounds by diet and exercise over the past year, and that this made her breasts lumpier feeling.  In any case, she brought this question to Dr. Truddie Hidden Lavoie's attention, and was set up for mammography at [facility] on 04/21/2008.  She had had a mammogram in 09/2007, which had been unremarkable.  The mammogram on 05/01/2008 showed a dense right breast with a suggestion of a possible mass in the upper central breast corresponding to the palpable abnormality.  Ultrasound was more revealing, and did show an irregularly bordered hypoechoic solid mass measuring 1.2 cm.  The patient was brought back for biopsy and clip placement on 04/23, and the pathology from that procedure ([id number] and [id number]) showed an invasive ductal carcinoma which was negative for the estrogen and progesterone receptor at 0 and 0 respectively.  The Ki-67 was 19%, and CISH was amplified with a ratio of 2.22 (this is just above the "indeterminate" range).  Bilateral breast MRIs were obtained on 04/30.  This showed in the right breast an enhancing rounded mass measuring up to 1.8 cm.  The left breast was unremarkable, and there were no enlarged or suspicious internal mammary or axillary nodes on either side.  With this information, the patient proceeded to definitive right lumpectomy and sentinel lymph node sampling on 05/16/2008.  The final pathology ([id number]) confirmed a 1.5 cm invasive ductal carcinoma, grade 2, with negative margins, no evidence of lymphovascular  invasion, and 0 of 3 sentinel lymph nodes involved.  The estrogen receptor test was repeated ([id number]), and was again negative with a strong positive control.  Her subsequent history is as detailed below.   INTERVAL HISTORY:  Tasharra returns today for follow-up of her estrogen receptor negative breast cancer. The interval history is generally unremarkable. She is doing "okay", doing well in her work, and exercising by running, walking, and going to the gym, usually for at least an hour a day.  REVIEW OF SYSTEMS:  Valera tells me people think she is too skinny but she likes her current weight and has been maintaining it without difficulty. She does not want to gain. She is worried about her liver function tests however there is no trend in her transaminases as discussed below. She worries about hearing loss. She has some arthritis. She feels forgetful. She has hot flashes. A detailed review of systems today was otherwise stable  PAST MEDICAL HISTORY: Past Medical History:  Diagnosis Date  . Cancer (HCC)    breast  . Chronic leg pain   . Endometriosis   . Obesity   . Paresthesias    Generalized  . 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 Thrombocytopenia, secondary    secondary to valtrex  1. Obesity.  As stated, the patient has been on a very strict 1200-1400 calorie diet and 5 days of exercise for over a year, and tells me she has lost over 100 pounds.  She tells me she has done this in the past, and that she loses weight easily, but also gains weight easily.  She  is now a bit below where she would like to be, her goal being approximately 140 pounds. 2. She had an episode of cholecystitis, and underwent cholecystectomy on 06/01/2008 under Dr. Dalbert Batman.  The pathology there ([id number]) showed only chronic cholecystitis and cholelithiasis with a benign cystic duct lymph node. 3. The patient has a history of endometriosis. 4. Status post umbilical hernia repair. 5. The patient tried for many years  to get pregnant and took "large doses of fertility drugs" for about 8 or 9 years before giving up on those attempts.       6.   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          7.  History of poorly understood neurologic symptom of  hyperesthesia, controlled on neurontin    PAST SURGICAL HISTORY: S/p cholecystectomy  FAMILY HISTORY The patient's father died at the age of 16 from causes not quite clear to the patient, but he certainly had significant heart disease.  The patient's mother is alive.  She has a history of bladder cancer.  Both the patient's parents were single children.  The patient has one sister and two brothers.  There is no history of breast cancer in the immediate family.  GYNECOLOGIC HISTORY: She is GX, P0. She was having regular periods at the time of her cancer diagnosis, these were interrupted by chemotherapy, then resumed. Her periods are now irregular. She is having mild hot flashes.  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  Their two children, [name] and [name],  were adopted from [place].  The patient attends the [employer].    ADVANCED DIRECTIVES Not in place    HEALTH MAINTENANCE: Social History  Substance Use Topics  . Smoking status: Former Research scientist (life sciences)  . Smokeless tobacco: Never Used  . Alcohol use Yes     Comment: 1 or 2 per week     Colonoscopy: 2011  PAP: UTD  Bone density:  Lipid panel:  Allergies  Allergen Reactions  . Clarithromycin     Thrush  . Penicillins Other (See Comments)    Child hood     Current Outpatient Prescriptions  Medication Sig Dispense Refill  . B Complex Vitamins (B-COMPLEX/B-12 PO) Take by mouth.    . Biotin 1000 MCG tablet Take 1,000 mcg by mouth daily.     . cholecalciferol (VITAMIN D) 1000 UNITS tablet Take 2,000 Units by mouth daily.    . DULoxetine (CYMBALTA) 30 MG capsule Take 1 capsule (30 mg total) by mouth  daily. 90 capsule 3  . gabapentin (NEURONTIN) 300 MG capsule Take 3 capsules (900 mg total) by mouth 4 (four) times daily. 1080 capsule 3  . Multiple Vitamin (MULTIVITAMIN) tablet Take 1 tablet by mouth daily.    . valACYclovir (VALTREX) 500 MG tablet Take 500 mg by mouth daily.      No current facility-administered medications for this visit.     OBJECTIVE: Middle-aged white woman Who appears well  Vitals:   06/04/16 1214  BP: 118/72  Pulse: (!) 42  Resp: 18  Temp: 98 F (36.7 C)  TempSrc: Oral  SpO2: 100%  Weight: 122 lb 4.8 oz (55.5 kg)  Height: 5' 7.5" (1.715 m)   Sclerae unicteric, EOMs intact Oropharynx clear and moist No cervical or supraclavicular adenopathy Lungs no rales or rhonchi Heart regular rate and rhythm Abd soft, nontender, positive bowel sounds MSK no focal spinal tenderness,  no upper extremity lymphedema Neuro: nonfocal, well oriented, appropriate affect Breasts: The right breast is status post lumpectomy and radiation. There is no evidence of local recurrence. The left breast is unremarkable. Both axillae are benign.  LAB RESULTS: CBC    Component Value Date/Time   WBC 3.2 (L) 05/30/2016 1230   WBC 3.9 (L) 09/07/2015 0825   RBC 3.71 05/30/2016 1230   RBC 3.66 (L) 09/07/2015 0825   HGB 12.6 05/30/2016 1230   HCT 37.8 05/30/2016 1230   PLT 140 (L) 05/30/2016 1230   MCV 101.8 (H) 05/30/2016 1230   MCH 33.9 05/30/2016 1230   MCH 33.4 01/17/2009 0933   MCHC 33.3 05/30/2016 1230   MCHC 33.8 09/07/2015 0825   RDW 12.8 05/30/2016 1230   LYMPHSABS 1.3 05/30/2016 1230   MONOABS 0.2 05/30/2016 1230   EOSABS 0.1 05/30/2016 1230   BASOSABS 0.0 05/30/2016 1230    CMP Latest Ref Rng & Units 05/30/2016 03/22/2016 02/26/2016  Glucose 70 - 140 mg/dl 63(L) 94 77  BUN 7.0 - 26.0 mg/dL 15.3 17.8 15.2  Creatinine 0.6 - 1.1 mg/dL 0.9 1.0 0.9  Sodium 136 - 145 mEq/L 137 141 137  Potassium 3.5 - 5.1 mEq/L 4.8 4.3 4.0  Chloride 96 - 112 mEq/L - - -  CO2 22 - 29  mEq/L '29 29 27  ' Calcium 8.4 - 10.4 mg/dL 9.4 10.2 9.6  Total Protein 6.4 - 8.3 g/dL 6.6 7.3 7.0  Total Bilirubin 0.20 - 1.20 mg/dL 0.47 0.56 0.48  Alkaline Phos 40 - 150 U/L 72 79 81  AST 5 - 34 U/L 63(H) 47(H) 56(H)  ALT 0 - 55 U/L 65(H) 56(H) 69(H)      STUDIES: Her labwork was extensively reviewed today  ASSESSMENT: 56 y.o.  BRCA negative [place] woman  (1) status post right upper inner quadrant lumpectomy and sentinel lymph node dissection May 2010 for a T1c N0, stage 1A invasive ductal carcinoma,  grade 2,  estrogen and progesterone receptor negative, but HER-2/neu positive.  The MIB-1 was 19%.   (2) received 4 cycles of docetaxel/carboplatin/trastuzumab adjuvantly  (3) she continued Herceptin to July 2010 with echocardiogram showing a well-preserved ejection fraction after the completion of treatment.  (4) she completed radiation in December 2010.   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 Sequencing was performed for select regions of POLE and POLD1, and large rearrangement analysis was performed for select regions of GREM1.   (7) persistent mild transaminase elevation, with negative hepatitis and HIV serologies and no evidence of metastases by CT scan of the chest 02/28/2016  (8) an deficiency, status post Feraheme times 03/08/2016   PLAN:  Ajiah is now 8 years out from definitive surgery for her breast cancer with no evidence of disease recurrence. This is very favorable.  She continues to have her hyperesthesia symptoms but these are very long-standing, have not progressed, and she barely mentions them  at present. They have just becoming apart of who she is.  We discussed the transaminase elevation which shows no trend. I think this is going to be due to fatty liver. If that is what it is she is doing the right thing with her current exercise  and diet program.  We reviewed her ferritin which has corrected. She currently has no anemia.  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 This is likely therefore familial  She is going to see me again in a year. She knows to call for any problems that may develop before that visit.  MAGRINAT,GUSTAV C   06/04/2016

## 2016-06-05 ENCOUNTER — Encounter: Payer: Managed Care, Other (non HMO) | Admitting: Obstetrics & Gynecology

## 2016-06-13 ENCOUNTER — Other Ambulatory Visit: Payer: Self-pay | Admitting: *Deleted

## 2016-06-13 ENCOUNTER — Telehealth: Payer: Self-pay | Admitting: *Deleted

## 2016-06-13 DIAGNOSIS — C50211 Malignant neoplasm of upper-inner quadrant of right female breast: Secondary | ICD-10-CM

## 2016-06-13 DIAGNOSIS — R1011 Right upper quadrant pain: Secondary | ICD-10-CM

## 2016-06-13 DIAGNOSIS — R740 Nonspecific elevation of levels of transaminase and lactic acid dehydrogenase [LDH]: Secondary | ICD-10-CM

## 2016-06-13 DIAGNOSIS — R7401 Elevation of levels of liver transaminase levels: Secondary | ICD-10-CM

## 2016-06-13 DIAGNOSIS — D500 Iron deficiency anemia secondary to blood loss (chronic): Secondary | ICD-10-CM

## 2016-06-13 NOTE — Telephone Encounter (Signed)
Pt called to this RN to state per last visit with Dr Jannifer Rodney - noted continued elevation in transaminase levels.  Jeanette Boone reviewed her records and noted elevation ongoing since 2015 as well as " I have had an intermittent pain in my upper right quadrant but due to my issue with chronic nerve pain - I tend to ignore pain "  Pt is requesting a referral to a " hepatologist " -   This RN discussed with pt above reviewed with MD and due to unknown etiology of elevated levels best to start with a GI doctor.  Jeanette Boone verbalized understanding .  Referral placed to Dr Watt Climes at [place].

## 2016-06-27 ENCOUNTER — Encounter: Payer: Managed Care, Other (non HMO) | Admitting: Obstetrics & Gynecology

## 2016-07-01 ENCOUNTER — Encounter: Payer: Managed Care, Other (non HMO) | Admitting: Obstetrics & Gynecology

## 2016-07-15 ENCOUNTER — Other Ambulatory Visit: Payer: Self-pay | Admitting: Gastroenterology

## 2016-07-15 DIAGNOSIS — R7989 Other specified abnormal findings of blood chemistry: Secondary | ICD-10-CM

## 2016-07-15 DIAGNOSIS — R945 Abnormal results of liver function studies: Principal | ICD-10-CM

## 2016-07-19 ENCOUNTER — Ambulatory Visit
Admission: RE | Admit: 2016-07-19 | Discharge: 2016-07-19 | Disposition: A | Discharge status: Home / Self Care | Payer: Managed Care, Other (non HMO) | Source: Ambulatory Visit | Attending: Gastroenterology | Admitting: Gastroenterology

## 2016-07-19 DIAGNOSIS — R7989 Other specified abnormal findings of blood chemistry: Secondary | ICD-10-CM

## 2016-07-19 DIAGNOSIS — R945 Abnormal results of liver function studies: Principal | ICD-10-CM

## 2016-07-29 ENCOUNTER — Ambulatory Visit (INDEPENDENT_AMBULATORY_CARE_PROVIDER_SITE_OTHER): Payer: Managed Care, Other (non HMO) | Admitting: Obstetrics & Gynecology

## 2016-07-29 ENCOUNTER — Encounter: Payer: Self-pay | Admitting: Obstetrics & Gynecology

## 2016-07-29 VITALS — BP 114/70 | Ht 67.5 in | Wt 121.0 lb

## 2016-07-29 DIAGNOSIS — C50211 Malignant neoplasm of upper-inner quadrant of right female breast: Secondary | ICD-10-CM | POA: Diagnosis not present

## 2016-07-29 DIAGNOSIS — R748 Abnormal levels of other serum enzymes: Secondary | ICD-10-CM

## 2016-07-29 DIAGNOSIS — Z01419 Encounter for gynecological examination (general) (routine) without abnormal findings: Secondary | ICD-10-CM

## 2016-07-29 DIAGNOSIS — Z780 Asymptomatic menopausal state: Secondary | ICD-10-CM

## 2016-07-29 DIAGNOSIS — B0089 Other herpesviral infection: Secondary | ICD-10-CM | POA: Diagnosis not present

## 2016-07-29 MED ORDER — VALACYCLOVIR HCL 500 MG PO TABS: 500.0000 mg | ORAL_TABLET | Freq: Every day | ORAL | 4 refills | Status: DC

## 2016-07-29 NOTE — Progress Notes (Signed)
Jeanette Boone 1960/10/09 [id number]   History:    56 y.o.  G1P0A1 married.  2 adopted children 87 and 76 yo.  RP:  Established patient presenting for annual gyn exam   HPI:  Menopausal.  No HRT.  No PMB.  H/O Breast Cancer x 8 yrs, f/u Dr Jana Hakim.  Breasts wnl currently.  Mild increase in Liver Enzymes, Fatty liver, will f/u Hepatic specialist.  No pelvic pain.  Pelvic US neg 06/2015.   BMI 18.7.   On Valacyclovir for oral HSV.  Past medical history,surgical history, family history and social history were all reviewed and documented in the EPIC chart.  Gynecologic History Patient's last menstrual period was 05/13/2010. Contraception: post menopausal status Last Pap: 05/2015. Results were: normal Last mammogram: 09/2015. Results were: Negative Colonoscopy 2011 No BD yet  Obstetric History OB History  Gravida Para Term Preterm AB Living  1 0     1 0  SAB TAB Ectopic Multiple Live Births  1            # Outcome Date GA Lbr Len/2nd Weight Sex Delivery Anes PTL Lv  1 SAB                ROS: A ROS was performed and pertinent positives and negatives are included in the history.  GENERAL: No fevers or chills. HEENT: No change in vision, no earache, sore throat or sinus congestion. NECK: No pain or stiffness. CARDIOVASCULAR: No chest pain or pressure. No palpitations. PULMONARY: No shortness of breath, cough or wheeze. GASTROINTESTINAL: No abdominal pain, nausea, vomiting or diarrhea, melena or bright red blood per rectum. GENITOURINARY: No urinary frequency, urgency, hesitancy or dysuria. MUSCULOSKELETAL: No joint or muscle pain, no back pain, no recent trauma. DERMATOLOGIC: No rash, no itching, no lesions. ENDOCRINE: No polyuria, polydipsia, no heat or cold intolerance. No recent change in weight. HEMATOLOGICAL: No anemia or easy bruising or bleeding. NEUROLOGIC: No headache, seizures, numbness, tingling or weakness. PSYCHIATRIC: No depression, no loss of interest in normal activity or  change in sleep pattern.     Exam:   BP 114/70   Ht 5' 7.5" (1.715 m)   Wt 121 lb (54.9 kg)   LMP 05/13/2010   BMI 18.67 kg/m   Body mass index is 18.67 kg/m.  General appearance : Well developed, but emaciated and yellow tone to skin. No acute distress HEENT: Eyes: no retinal hemorrhage or exudates,  Neck supple, trachea midline, no carotid bruits, no thyroidmegaly Lungs: Clear to auscultation, no rhonchi or wheezes, or rib retractions  Heart: Regular rate and rhythm, no murmurs or gallops Breast:Examined in sitting and supine position were symmetrical in appearance, no palpable masses or tenderness,  no skin retraction, no nipple inversion, no nipple discharge, no skin discoloration, no axillary or supraclavicular lymphadenopathy Abdomen: no palpable masses or tenderness, no rebound or guarding Extremities: no edema or skin discoloration or tenderness  Pelvic:  Bartholin, Urethra, Skene Glands: Within normal limits             Vagina: No gross lesions or discharge  Cervix: No gross lesions or discharge.  Pap reflex done.  Uterus  AV, normal size, shape and consistency, non-tender and mobile  Adnexa  Without masses or tenderness  Anus and perineum  normal     Assessment/Plan:  56 y.o. female for annual exam   1. Encounter for routine gynecological examination with Papanicolaou smear of cervix Gyn exam wnl, but atrophic vaginitis.  Pap reflex done.  Breasts  wnl.  2. Menopause present No HRT.  No PMB.  Vit D supplement, Ca++ in nutrition.  Will schedule BD asap.  3. Recurrent oral herpes simplex infection  Valacyclovir prophylaxis represcribed.  4. Malignant neoplasm of upper-inner quadrant of right female breast, unspecified estrogen receptor status ([place]) F/U Dr Jana Hakim.  5. Elevated liver enzymes Skin appears mildly icteric.  F/U Hepatic specialist as planned.  Counseling on above issues >50% x 10 minutes.  Princess Bruins MD, 2:10 PM 07/29/2016

## 2016-07-31 NOTE — Patient Instructions (Signed)
1. Encounter for routine gynecological examination with Papanicolaou smear of cervix Gyn exam wnl, but atrophic vaginitis.  Pap reflex done.  Breasts wnl.  2. Menopause present No HRT.  No PMB.  Vit D supplement, Ca++ in nutrition.  Will schedule BD asap.  3. Recurrent oral herpes simplex infection  Valacyclovir prophylaxis represcribed.  4. Malignant neoplasm of upper-inner quadrant of right female breast, unspecified estrogen receptor status ([place]) F/U Dr Jana Hakim.  5. Elevated liver enzymes Skin appears mildly icteric.  F/U Hepatic specialist as planned.  Jeanette Boone, it was a pleasure to see you today!  I will inform you of your results as soon as available.

## 2016-08-02 LAB — PAP IG W/ RFLX HPV ASCU

## 2016-08-28 ENCOUNTER — Other Ambulatory Visit: Payer: Self-pay | Admitting: Obstetrics & Gynecology

## 2016-08-28 DIAGNOSIS — Z13820 Encounter for screening for osteoporosis: Secondary | ICD-10-CM

## 2016-08-29 ENCOUNTER — Other Ambulatory Visit: Payer: Self-pay | Admitting: Gynecology

## 2016-08-29 DIAGNOSIS — Z13820 Encounter for screening for osteoporosis: Secondary | ICD-10-CM

## 2016-09-05 ENCOUNTER — Encounter: Payer: Managed Care, Other (non HMO) | Admitting: Family Medicine

## 2016-09-17 ENCOUNTER — Other Ambulatory Visit: Payer: Managed Care, Other (non HMO)

## 2016-09-24 ENCOUNTER — Ambulatory Visit: Payer: Managed Care, Other (non HMO) | Admitting: Oncology

## 2016-09-26 ENCOUNTER — Other Ambulatory Visit: Payer: Self-pay | Admitting: Gastroenterology

## 2016-09-26 DIAGNOSIS — R748 Abnormal levels of other serum enzymes: Secondary | ICD-10-CM

## 2016-09-26 DIAGNOSIS — R932 Abnormal findings on diagnostic imaging of liver and biliary tract: Secondary | ICD-10-CM

## 2016-10-01 ENCOUNTER — Encounter: Payer: Self-pay | Admitting: Gynecology

## 2016-10-01 ENCOUNTER — Ambulatory Visit (INDEPENDENT_AMBULATORY_CARE_PROVIDER_SITE_OTHER): Payer: Managed Care, Other (non HMO)

## 2016-10-01 DIAGNOSIS — Z13820 Encounter for screening for osteoporosis: Secondary | ICD-10-CM

## 2016-10-08 ENCOUNTER — Other Ambulatory Visit: Payer: Managed Care, Other (non HMO)

## 2016-10-21 ENCOUNTER — Ambulatory Visit
Admission: RE | Admit: 2016-10-21 | Discharge: 2016-10-21 | Disposition: A | Discharge status: Home / Self Care | Payer: Managed Care, Other (non HMO) | Source: Ambulatory Visit | Attending: Gastroenterology | Admitting: Gastroenterology

## 2016-10-21 DIAGNOSIS — R932 Abnormal findings on diagnostic imaging of liver and biliary tract: Secondary | ICD-10-CM

## 2016-10-21 DIAGNOSIS — R748 Abnormal levels of other serum enzymes: Secondary | ICD-10-CM

## 2016-11-19 ENCOUNTER — Ambulatory Visit (INDEPENDENT_AMBULATORY_CARE_PROVIDER_SITE_OTHER): Payer: Managed Care, Other (non HMO) | Admitting: Neurology

## 2016-11-19 ENCOUNTER — Encounter: Payer: Self-pay | Admitting: Neurology

## 2016-11-19 VITALS — BP 99/60 | HR 44 | Ht 67.5 in | Wt 124.0 lb

## 2016-11-19 DIAGNOSIS — G549 Nerve root and plexus disorder, unspecified: Secondary | ICD-10-CM | POA: Diagnosis not present

## 2016-11-19 DIAGNOSIS — R252 Cramp and spasm: Secondary | ICD-10-CM

## 2016-11-19 HISTORY — DX: Cramp and spasm: R25.2

## 2016-11-19 MED ORDER — DULOXETINE HCL 30 MG PO CPEP: ORAL_CAPSULE | ORAL | 3 refills | Status: DC

## 2016-11-19 MED ORDER — GABAPENTIN 300 MG PO CAPS: 900.0000 mg | ORAL_CAPSULE | Freq: Four times a day (QID) | ORAL | 3 refills | Status: DC

## 2016-11-19 NOTE — Progress Notes (Signed)
Reason for visit: Sensory neuropathy  Jeanette Boone is an 56 y.o. 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  The patient got Taxotere during treatment for breast cancer, but the neuropathy was present before chemotherapy was initiated.  The patient has been clinically stable since treatment of the breast cancer, she continues to have sensory changes with burning and stinging sensations in the feet and hands.  She has not had any change in balance, she does have leg cramps that occur frequently during the day and night.  She does not believe that she gets in enough fluids during the day.  The patient has been diagnosed with fatty liver, she is being followed for this.  The patient is on gabapentin and Cymbalta, she is tolerating the pain level fairly well on these medications.  She is sleeping fairly well at night with exception of the muscle cramps.  Past Medical History:  Diagnosis Date  . Cancer (HCC)    breast  . Chronic leg pain   . Endometriosis   . Family history of bladder cancer   . Family history of breast cancer   . Obesity   . Paresthesias    Generalized  . 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 Thrombocytopenia, secondary    secondary to valtrex    Past Surgical History:  Procedure Laterality Date  . BREAST SURGERY  05/2008   lumpectomy - right  . CHOLECYSTECTOMY  05/2008  . COLONOSCOPY  08/2009  . HERNIA REPAIR  1443   umbilical  . HERNIA REPAIR  1996  . LAPAROSCOPY    . port a cath insertion  06/2008  . uterine polyp removal  06/2008    Family History  Problem Relation Age of Onset  . Heart disease Father        pacemaker----tachycardia  . Stroke Maternal Grandmother   . Bladder Cancer Maternal Grandmother        dx in her 28s  . Heart disease Maternal Grandfather 27       MI  . Stroke  Maternal Grandfather   . Heart disease Paternal Grandfather 61       MI  . Hypertension Mother   . Breast cancer Other        MGF's sister    Social history:  reports that she has quit smoking. she has never used smokeless tobacco. She reports that she drinks alcohol. She reports that she uses drugs. Drug: Methaqualone.    Allergies  Allergen Reactions  . Clarithromycin     Thrush  . Penicillins Other (See Comments)    Child hood     Medications:  Prior to Admission medications   Medication Sig Start Date End Date Taking? Authorizing Provider  B Complex Vitamins (B-COMPLEX/B-12 PO) Take by mouth.   Yes [provider]  Biotin 1000 MCG tablet Take 1,000 mcg by mouth daily.    Yes [provider]  cholecalciferol (VITAMIN D) 1000 UNITS tablet Take 2,000 Units by mouth daily.   Yes [provider]  DULoxetine (CYMBALTA) 30 MG capsule TAKE 1 CAPSULE(30 MG) BY MOUTH DAILY 11/20/15  Yes Kathrynn Ducking, MD  gabapentin (NEURONTIN) 300 MG capsule Take 3 capsules (900 mg total) by mouth 4 (four) times daily. 11/20/15  Yes Kathrynn Ducking, MD  Multiple Vitamin (MULTIVITAMIN) tablet Take  1 tablet by mouth daily.   Yes [provider]  valACYclovir (VALTREX) 500 MG tablet Take 1 tablet (500 mg total) by mouth daily. 07/29/16  Yes Princess Bruins, MD    ROS:  Out of a complete 14 system review of symptoms, the patient complains only of the following symptoms, and all other reviewed systems are negative.  Numbness, weakness Joint pain, aching muscles, muscle cramps  Blood pressure 99/60, pulse (!) 44, height 5' 7.5" (1.715 m), weight 124 lb (56.2 kg), last menstrual period 05/13/2010.  Physical Exam  General: The patient is alert and cooperative at the time of the examination.  Skin: No significant peripheral edema is noted.   Neurologic Exam  Mental status: The patient is alert and oriented x 3 at the time of the examination. The patient  has apparent normal recent and remote memory, with an apparently normal attention span and concentration ability.   Cranial nerves: Facial symmetry is present. Speech is normal, no aphasia or dysarthria is noted. Extraocular movements are full. Visual fields are full.  Motor: The patient has good strength in all 4 extremities.  Sensory examination: Soft touch sensation is symmetric on the face, arms, and legs.  Coordination: The patient has good finger-nose-finger and heel-to-shin bilaterally.  Gait and station: The patient has a normal gait. Tandem gait is normal. Romberg is negative. No drift is seen.  Reflexes: Deep tendon reflexes are symmetric, ankle jerk reflexes are depressed.   Assessment/Plan:  1.  Sensory neuropathy  2.  Leg cramps  The patient is stable on her current dose of Cymbalta and gabapentin.  A prescription was sent in for these medications.  The patient may try magnesium for the nocturnal leg cramps.  The patient will follow-up in 1 year, sooner if needed.  Jill Alexanders MD 11/19/2016 8:54 AM  [facility] [address] [place], [place] [address]  Phone [phone] Fax [phone]

## 2016-12-02 ENCOUNTER — Encounter: Payer: Managed Care, Other (non HMO) | Admitting: Family Medicine

## 2016-12-11 ENCOUNTER — Ambulatory Visit (INDEPENDENT_AMBULATORY_CARE_PROVIDER_SITE_OTHER): Payer: Managed Care, Other (non HMO)

## 2016-12-11 DIAGNOSIS — Z23 Encounter for immunization: Secondary | ICD-10-CM

## 2017-01-13 ENCOUNTER — Other Ambulatory Visit: Payer: Self-pay | Admitting: Oncology

## 2017-01-13 DIAGNOSIS — Z139 Encounter for screening, unspecified: Secondary | ICD-10-CM

## 2017-02-03 ENCOUNTER — Encounter: Payer: Managed Care, Other (non HMO) | Admitting: Family Medicine

## 2017-02-06 ENCOUNTER — Ambulatory Visit
Admission: RE | Admit: 2017-02-06 | Discharge: 2017-02-06 | Disposition: A | Discharge status: Home / Self Care | Payer: Managed Care, Other (non HMO) | Source: Ambulatory Visit | Attending: Oncology | Admitting: Oncology

## 2017-02-06 DIAGNOSIS — Z139 Encounter for screening, unspecified: Secondary | ICD-10-CM

## 2017-03-21 ENCOUNTER — Encounter: Payer: Managed Care, Other (non HMO) | Admitting: Family Medicine

## 2017-03-24 ENCOUNTER — Telehealth: Payer: Self-pay | Admitting: *Deleted

## 2017-03-24 ENCOUNTER — Encounter: Payer: Managed Care, Other (non HMO) | Admitting: Family Medicine

## 2017-03-24 ENCOUNTER — Telehealth: Payer: Self-pay | Admitting: Family Medicine

## 2017-03-24 NOTE — Telephone Encounter (Signed)
Called and left message to try to reschedule pt's CPE.

## 2017-03-24 NOTE — Telephone Encounter (Signed)
Copied from [place]. Topic: General - Other >> Mar 24, 2017  1:22 PM Marin Olp L wrote: Reason for CRM: Patient calling to r/s cpe w/ [place] who is out of the office w/ personal matter. Patient would like to be worked in [place] as [place] is booked for CPE's out to June.

## 2017-03-24 NOTE — Telephone Encounter (Signed)
Dr. Etter Sjogren is willing to do an extra CPE to accommodate for the CPEs that we had to reschedule

## 2017-03-24 NOTE — Telephone Encounter (Signed)
Patient called and scheduled for next week

## 2017-03-24 NOTE — Telephone Encounter (Signed)
Left message appt needs to be rescheduled per pcp.

## 2017-03-31 ENCOUNTER — Telehealth: Payer: Self-pay | Admitting: *Deleted

## 2017-03-31 ENCOUNTER — Ambulatory Visit (INDEPENDENT_AMBULATORY_CARE_PROVIDER_SITE_OTHER): Payer: Managed Care, Other (non HMO) | Admitting: Family Medicine

## 2017-03-31 ENCOUNTER — Encounter: Payer: Self-pay | Admitting: Family Medicine

## 2017-03-31 VITALS — BP 120/68 | HR 47 | Temp 98.0°F | Resp 16 | Ht 68.0 in | Wt 147.8 lb

## 2017-03-31 DIAGNOSIS — E559 Vitamin D deficiency, unspecified: Secondary | ICD-10-CM

## 2017-03-31 DIAGNOSIS — Z8249 Family history of ischemic heart disease and other diseases of the circulatory system: Secondary | ICD-10-CM

## 2017-03-31 DIAGNOSIS — K760 Fatty (change of) liver, not elsewhere classified: Secondary | ICD-10-CM

## 2017-03-31 DIAGNOSIS — Z0000 Encounter for general adult medical examination without abnormal findings: Secondary | ICD-10-CM

## 2017-03-31 LAB — COMPREHENSIVE METABOLIC PANEL
ALT: 25 U/L (ref 0–35)
AST: 29 U/L (ref 0–37)
Albumin: 3.8 g/dL (ref 3.5–5.2)
Alkaline Phosphatase: 73 U/L (ref 39–117)
BILIRUBIN TOTAL: 0.5 mg/dL (ref 0.2–1.2)
BUN: 17 mg/dL (ref 6–23)
CHLORIDE: 101 meq/L (ref 96–112)
CO2: 34 meq/L — AB (ref 19–32)
CREATININE: 0.71 mg/dL (ref 0.40–1.20)
Calcium: 9.3 mg/dL (ref 8.4–10.5)
GFR: 90.53 mL/min (ref >60.00)
Glucose, Bld: 90 mg/dL (ref 70–99)
Potassium: 4.3 mEq/L (ref 3.5–5.1)
SODIUM: 138 meq/L (ref 135–145)
Total Protein: 6.9 g/dL (ref 6.0–8.3)

## 2017-03-31 LAB — LIPID PANEL
CHOL/HDL RATIO: 2
Cholesterol: 213 mg/dL — ABNORMAL HIGH (ref 0–200)
HDL: 93.9 mg/dL (ref >39.00)
LDL Cholesterol: 109 mg/dL — ABNORMAL HIGH (ref 0–99)
NonHDL: 119.54
TRIGLYCERIDES: 55 mg/dL (ref 0.0–149.0)
VLDL: 11 mg/dL (ref 0.0–40.0)

## 2017-03-31 LAB — CBC WITH DIFFERENTIAL/PLATELET
BASOS ABS: 0.1 10*3/uL (ref 0.0–0.1)
BASOS PCT: 1.1 % (ref 0.0–3.0)
EOS ABS: 0.2 10*3/uL (ref 0.0–0.7)
Eosinophils Relative: 3.7 % (ref 0.0–5.0)
HEMATOCRIT: 37.3 % (ref 36.0–46.0)
Hemoglobin: 12.3 g/dL (ref 12.0–15.0)
LYMPHS ABS: 1.6 10*3/uL (ref 0.7–4.0)
LYMPHS PCT: 35.8 % (ref 12.0–46.0)
MCHC: 33.1 g/dL (ref 30.0–36.0)
MCV: 99.2 fl (ref 78.0–100.0)
MONO ABS: 0.4 10*3/uL (ref 0.1–1.0)
Monocytes Relative: 8.8 % (ref 3.0–12.0)
NEUTROS ABS: 2.3 10*3/uL (ref 1.4–7.7)
NEUTROS PCT: 50.6 % (ref 43.0–77.0)
PLATELETS: 187 10*3/uL (ref 150.0–400.0)
RBC: 3.75 Mil/uL — ABNORMAL LOW (ref 3.87–5.11)
RDW: 12.9 % (ref 11.5–15.5)
WBC: 4.5 10*3/uL (ref 4.0–10.5)

## 2017-03-31 LAB — POC URINALSYSI DIPSTICK (AUTOMATED)
BILIRUBIN UA: NEGATIVE
GLUCOSE UA: NEGATIVE
Ketones, UA: NEGATIVE
Leukocytes, UA: NEGATIVE
Nitrite, UA: NEGATIVE
PROTEIN UA: NEGATIVE
RBC UA: NEGATIVE
SPEC GRAV UA: 1.01 (ref 1.010–1.025)
Urobilinogen, UA: 0.2 E.U./dL
pH, UA: 7 (ref 5.0–8.0)

## 2017-03-31 LAB — TSH: TSH: 2.3 u[IU]/mL (ref 0.35–4.50)

## 2017-03-31 LAB — VITAMIN D 25 HYDROXY (VIT D DEFICIENCY, FRACTURES): VITD: 45.68 ng/mL (ref 30.00–100.00)

## 2017-03-31 NOTE — Patient Instructions (Signed)
Preventive Care 40-64 Years, Female Preventive care refers to lifestyle choices and visits with your health care provider that can promote health and wellness. What does preventive care include?  A yearly physical exam. This is also called an annual well check.  Dental exams once or twice a year.  Routine eye exams. Ask your health care provider how often you should have your eyes checked.  Personal lifestyle choices, including: ? Daily care of your teeth and gums. ? Regular physical activity. ? Eating a healthy diet. ? Avoiding tobacco and drug use. ? Limiting alcohol use. ? Practicing safe sex. ? Taking low-dose aspirin daily starting at age 58. ? Taking vitamin and mineral supplements as recommended by your health care provider. What happens during an annual well check? The services and screenings done by your health care provider during your annual well check will depend on your age, overall health, lifestyle risk factors, and family history of disease. Counseling Your health care provider may ask you questions about your:  Alcohol use.  Tobacco use.  Drug use.  Emotional well-being.  Home and relationship well-being.  Sexual activity.  Eating habits.  Work and work Statistician.  Method of birth control.  Menstrual cycle.  Pregnancy history.  Screening You may have the following tests or measurements:  Height, weight, and BMI.  Blood pressure.  Lipid and cholesterol levels. These may be checked every 5 years, or more frequently if you are over 81 years old.  Skin check.  Lung cancer screening. You may have this screening every year starting at age 78 if you have a 30-pack-year history of smoking and currently smoke or have quit within the past 15 years.  Fecal occult blood test (FOBT) of the stool. You may have this test every year starting at age 65.  Flexible sigmoidoscopy or colonoscopy. You may have a sigmoidoscopy every 5 years or a colonoscopy  every 10 years starting at age 30.  Hepatitis C blood test.  Hepatitis B blood test.  Sexually transmitted disease (STD) testing.  Diabetes screening. This is done by checking your blood sugar (glucose) after you have not eaten for a while (fasting). You may have this done every 1-3 years.  Mammogram. This may be done every 1-2 years. Talk to your health care provider about when you should start having regular mammograms. This may depend on whether you have a family history of breast cancer.  BRCA-related cancer screening. This may be done if you have a family history of breast, ovarian, tubal, or peritoneal cancers.  Pelvic exam and Pap test. This may be done every 3 years starting at age 80. Starting at age 36, this may be done every 5 years if you have a Pap test in combination with an HPV test.  Bone density scan. This is done to screen for osteoporosis. You may have this scan if you are at high risk for osteoporosis.  Discuss your test results, treatment options, and if necessary, the need for more tests with your health care provider. Vaccines Your health care provider may recommend certain vaccines, such as:  Influenza vaccine. This is recommended every year.  Tetanus, diphtheria, and acellular pertussis (Tdap, Td) vaccine. You may need a Td booster every 10 years.  Varicella vaccine. You may need this if you have not been vaccinated.  Zoster vaccine. You may need this after age 5.  Measles, mumps, and rubella (MMR) vaccine. You may need at least one dose of MMR if you were born in  1957 or later. You may also need a second dose.  Pneumococcal 13-valent conjugate (PCV13) vaccine. You may need this if you have certain conditions and were not previously vaccinated.  Pneumococcal polysaccharide (PPSV23) vaccine. You may need one or two doses if you smoke cigarettes or if you have certain conditions.  Meningococcal vaccine. You may need this if you have certain  conditions.  Hepatitis A vaccine. You may need this if you have certain conditions or if you travel or work in places where you may be exposed to hepatitis A.  Hepatitis B vaccine. You may need this if you have certain conditions or if you travel or work in places where you may be exposed to hepatitis B.  Haemophilus influenzae type b (Hib) vaccine. You may need this if you have certain conditions.  Talk to your health care provider about which screenings and vaccines you need and how often you need them. This information is not intended to replace advice given to you by your health care provider. Make sure you discuss any questions you have with your health care provider. Document Released: 01/20/2015 Document Revised: 09/13/2015 Document Reviewed: 10/25/2014 Elsevier Interactive Patient Education  2018 Elsevier Inc.  

## 2017-03-31 NOTE — Progress Notes (Signed)
Subjective:     Jeanette Boone is a 57 y.o. female and is here for a comprehensive physical exam. The patient reports no problems.   Pt dx by dr Watt Climes with fatty liver     She also is requesting an ekg due to family hx of heart disease.     Social History   Socioeconomic History  . Marital status: Married    Spouse name: donald  . Number of children: 0  . Years of education: college  . Highest education level: Not on file  Occupational History  . Occupation: Pharmacist, hospital.    Employer: [employer]  . Financial resource strain: Not on file  . Food insecurity:    Worry: Not on file    Inability: Not on file  . Transportation needs:    Medical: Not on file    Non-medical: Not on file  Tobacco Use  . Smoking status: Former Research scientist (life sciences)  . Smokeless tobacco: Never Used  Substance and Sexual Activity  . Alcohol use: Not Currently    Comment: 0  . Drug use: Never  . Sexual activity: Yes    Partners: Male    Comment: refused to answer insurance questions  Lifestyle  . Physical activity:    Days per week: Not on file    Minutes per session: Not on file  . Stress: Not on file  Relationships  . Social connections:    Talks on phone: Not on file    Gets together: Not on file    Attends religious service: Not on file    Active member of club or organization: Not on file    Attends meetings of clubs or organizations: Not on file    Relationship status: Not on file  . Intimate partner violence:    Fear of current or ex partner: Not on file    Emotionally abused: Not on file    Physically abused: Not on file    Forced sexual activity: Not on file  Other Topics Concern  . Not on file  Social History Narrative   Exercise-- yes      Patient is right handed.   Patient does not drink caffeine.   Health Maintenance  Topic Date Due  . TETANUS/TDAP  02/02/2018  . MAMMOGRAM  02/06/2018  . PAP SMEAR  08/01/2019  . COLONOSCOPY  09/08/2019  . INFLUENZA VACCINE   Completed  . Hepatitis C Screening  Completed  . HIV Screening  Completed    The following portions of the patient's history were reviewed and updated as appropriate:  She  has a past medical history of Cancer [facility]), Chronic leg pain, Endometriosis, Family history of bladder cancer, Family history of breast cancer, Leg cramps (11/19/2016), Obesity, Paresthesias, Sensory neuronopathy (11/02/2012), and Thrombocytopenia, secondary. She does not have any pertinent problems on file. She  has a past surgical history that includes Hernia repair (1995); Hernia repair (1996); Cholecystectomy (05/2008); uterine polyp removal (06/2008); Colonoscopy (08/2009); port a cath insertion (06/2008); Breast surgery (05/2008); and laparoscopy. Her family history includes Bladder Cancer in her maternal grandmother; Breast cancer in her other; Heart disease in her father; Heart disease (age of onset: 30) in her maternal grandfather; Heart disease (age of onset: 76) in her paternal grandfather; Hypertension in her mother; Stroke in her maternal grandfather and maternal grandmother. She  reports that she has quit smoking. She has never used smokeless tobacco. She reports that she drank alcohol. She reports that she does  not use drugs. She has a current medication list which includes the following prescription(s): b complex vitamins, biotin, cholecalciferol, duloxetine, gabapentin, multivitamin, and valacyclovir. Current Outpatient Medications on File Prior to Visit  Medication Sig Dispense Refill  . B Complex Vitamins (B-COMPLEX/B-12 PO) Take by mouth.    . Biotin 1000 MCG tablet Take 1,000 mcg by mouth daily.     . cholecalciferol (VITAMIN D) 1000 UNITS tablet Take 2,000 Units by mouth daily.    . DULoxetine (CYMBALTA) 30 MG capsule TAKE 1 CAPSULE(30 MG) BY MOUTH DAILY 90 capsule 3  . gabapentin (NEURONTIN) 300 MG capsule Take 3 capsules (900 mg total) 4 (four) times daily by mouth. 1080 capsule 3  . Multiple Vitamin  (MULTIVITAMIN) tablet Take 1 tablet by mouth daily.    . valACYclovir (VALTREX) 500 MG tablet Take 1 tablet (500 mg total) by mouth daily. 90 tablet 4   No current facility-administered medications on file prior to visit.    She is allergic to clarithromycin and penicillins.. EKG-- sinus brady Review of Systems Review of Systems  Constitutional: Negative for activity change, appetite change and fatigue.  HENT: Negative for hearing loss, congestion, tinnitus and ear discharge.  dentist q59m Eyes: Negative for visual disturbance (see optho q1y -- vision corrected to 20/20 with glasses).  Respiratory: Negative for cough, chest tightness and shortness of breath.   Cardiovascular: Negative for chest pain, palpitations and leg swelling.  Gastrointestinal: Negative for abdominal pain, diarrhea, constipation and abdominal distention.  Genitourinary: Negative for urgency, frequency, decreased urine volume and difficulty urinating.  Musculoskeletal: Negative for back pain, arthralgias and gait problem.  Skin: Negative for color change, pallor and rash.  Neurological: Negative for dizziness, light-headedness, numbness and headaches.  Hematological: Negative for adenopathy. Does not bruise/bleed easily.  Psychiatric/Behavioral: Negative for suicidal ideas, confusion, sleep disturbance, self-injury, dysphoric mood, decreased concentration and agitation.       Objective:    BP 120/68 (BP Location: Left Arm, Cuff Size: Normal)   Pulse (!) 47   Temp 98 F (36.7 C) (Oral)   Resp 16   Ht 5\' 8"  (1.727 m)   Wt 147 lb 12.8 oz (67 kg)   LMP 05/13/2010   SpO2 97%   BMI 22.47 kg/m  General appearance: alert, cooperative, appears stated age and no distress Head: Normocephalic, without obvious abnormality, atraumatic Eyes: conjunctivae/corneas clear. PERRL, EOM's intact. Fundi benign. Ears: normal TM's and external ear canals both ears Nose: Nares normal. Septum midline. Mucosa normal. No drainage or  sinus tenderness. Throat: lips, mucosa, and tongue normal; teeth and gums normal Neck: no adenopathy, no carotid bruit, no JVD, supple, symmetrical, trachea midline and thyroid not enlarged, symmetric, no tenderness/mass/nodules Back: symmetric, no curvature. ROM normal. No CVA tenderness. Lungs: clear to auscultation bilaterally Breasts: normal appearance, no masses or tenderness, gyn Heart: regular rate and rhythm, S1, S2 normal, no murmur, click, rub or gallop Abdomen: soft, non-tender; bowel sounds normal; no masses,  no organomegaly Pelvic: deferred--gyn Extremities: extremities normal, atraumatic, no cyanosis or edema Pulses: 2+ and symmetric Skin: Skin color, texture, turgor normal. No rashes or lesions Lymph nodes: Cervical, supraclavicular, and axillary nodes normal. Neurologic: Alert and oriented X 3, normal strength and tone. Normal symmetric reflexes. Normal coordination and gait    Assessment:    Healthy female exam.      Plan:     [facility] UTD CHECK LABS See After Visit Summary for Counseling Recommendations

## 2017-03-31 NOTE — Telephone Encounter (Signed)
Called patient back around 1110am to be triaged.  When we got to the room patient stated that "I do not have my med list, I left it in the lobby."  I asked where she was sitting and she told me.  I went to check the lobby and no paper was in the chair.  I asked the front staff did they receive a med list with patient name.  They stated no.  I also asked each assistant did their patients give them someone else's med list and the answer was no.  An assistant was cleaning her room and found patients med list in her room.  Patient notified that it was found.

## 2017-05-18 ENCOUNTER — Other Ambulatory Visit: Payer: Self-pay | Admitting: Neurology

## 2017-05-27 ENCOUNTER — Telehealth: Payer: Self-pay | Admitting: Oncology

## 2017-05-27 NOTE — Telephone Encounter (Signed)
GM PAL - Per GM moved f/u and associated labs from 5/29 to June/July/August. Spoke with patient re new appointments for lab 7/8 and GM 7/10.

## 2017-05-28 ENCOUNTER — Inpatient Hospital Stay: Payer: Managed Care, Other (non HMO)

## 2017-06-04 ENCOUNTER — Ambulatory Visit: Payer: Managed Care, Other (non HMO) | Admitting: Oncology

## 2017-07-14 ENCOUNTER — Inpatient Hospital Stay: Payer: Managed Care, Other (non HMO)

## 2017-07-16 ENCOUNTER — Ambulatory Visit: Payer: Managed Care, Other (non HMO) | Admitting: Oncology

## 2017-07-24 ENCOUNTER — Telehealth: Payer: Self-pay | Admitting: Oncology

## 2017-07-24 NOTE — Telephone Encounter (Signed)
Patient called to reschedule lab appointment from 7/19 to 7/22.

## 2017-07-25 ENCOUNTER — Other Ambulatory Visit: Payer: Managed Care, Other (non HMO)

## 2017-07-28 ENCOUNTER — Inpatient Hospital Stay: Payer: Managed Care, Other (non HMO) | Attending: Oncology

## 2017-07-28 DIAGNOSIS — Z853 Personal history of malignant neoplasm of breast: Secondary | ICD-10-CM | POA: Insufficient documentation

## 2017-07-28 DIAGNOSIS — R203 Hyperesthesia: Secondary | ICD-10-CM | POA: Diagnosis not present

## 2017-07-28 DIAGNOSIS — C50211 Malignant neoplasm of upper-inner quadrant of right female breast: Secondary | ICD-10-CM

## 2017-07-28 LAB — COMPREHENSIVE METABOLIC PANEL
ALBUMIN: 4 g/dL (ref 3.5–5.0)
ALK PHOS: 90 U/L (ref 38–126)
ALT: 25 U/L (ref 0–44)
ANION GAP: 6 (ref 5–15)
AST: 24 U/L (ref 15–41)
BILIRUBIN TOTAL: 0.3 mg/dL (ref 0.3–1.2)
BUN: 23 mg/dL — ABNORMAL HIGH (ref 6–20)
CALCIUM: 9.5 mg/dL (ref 8.9–10.3)
CO2: 30 mmol/L (ref 22–32)
CREATININE: 0.77 mg/dL (ref 0.44–1.00)
Chloride: 103 mmol/L (ref 98–111)
GFR calc Af Amer: >60 mL/min (ref >60)
GFR calc non Af Amer: >60 mL/min (ref >60)
GLUCOSE: 74 mg/dL (ref 70–99)
Potassium: 5.1 mmol/L (ref 3.5–5.1)
Sodium: 139 mmol/L (ref 135–145)
Total Protein: 7.1 g/dL (ref 6.5–8.1)

## 2017-07-28 LAB — CBC WITH DIFFERENTIAL/PLATELET
BASOS ABS: 0 10*3/uL (ref 0.0–0.1)
BASOS PCT: 1 %
Eosinophils Absolute: 0.4 10*3/uL (ref 0.0–0.5)
Eosinophils Relative: 9 %
HCT: 38 % (ref 34.8–46.6)
Hemoglobin: 12.3 g/dL (ref 11.6–15.9)
Lymphocytes Relative: 33 %
Lymphs Abs: 1.6 10*3/uL (ref 0.9–3.3)
MCH: 32.1 pg (ref 25.1–34.0)
MCHC: 32.4 g/dL (ref 31.5–36.0)
MCV: 99.2 fL (ref 79.5–101.0)
MONO ABS: 0.4 10*3/uL (ref 0.1–0.9)
Monocytes Relative: 8 %
Neutro Abs: 2.5 10*3/uL (ref 1.5–6.5)
Neutrophils Relative %: 49 %
Platelets: 228 10*3/uL (ref 145–400)
RBC: 3.83 MIL/uL (ref 3.70–5.45)
RDW: 13 % (ref 11.2–14.5)
WBC: 4.9 10*3/uL (ref 3.9–10.3)

## 2017-07-28 NOTE — Progress Notes (Signed)
ID: Jeanette Boone   DOB: Nov 11, 1960  MR#: [id number]  TIW#:[id number]  PCP: Garnet Koyanagi MD GYN: Princess Bruins MD SU: Fanny Skates MD OTHER MD: Morene Antu, Gery Pray, Jari Pigg, Lenor Coffin  CHIEF COMPLAINT: HER-2 positive breast cancer  CURRENT TREATMENT: Observation   BREAST CANCER HISTORY:  From the original intake note:  She herself palpated a mass in her right breast in 04/2008.  The patient tells me that she had lost approximately 100 pounds by diet and exercise over the past year, and that this made her breasts lumpier feeling.  In any case, she brought this question to Dr. Truddie Hidden Lavoie's attention, and was set up for mammography at [place] on 04/21/2008.  She had had a mammogram in 09/2007, which had been unremarkable.  The mammogram on 05/01/2008 showed a dense right breast with a suggestion of a possible mass in the upper central breast corresponding to the palpable abnormality.  Ultrasound was more revealing, and did show an irregularly bordered hypoechoic solid mass measuring 1.2 cm.  The patient was brought back for biopsy and clip placement on 04/23, and the pathology from that procedure ([id number] and [id number]) showed an invasive ductal carcinoma which was negative for the estrogen and progesterone receptor at 0 and 0 respectively.  The Ki-67 was 19%, and CISH was amplified with a ratio of 2.22 (this is just above the "indeterminate" range).  Bilateral breast MRIs were obtained on 04/30.  This showed in the right breast an enhancing rounded mass measuring up to 1.8 cm.  The left breast was unremarkable, and there were no enlarged or suspicious internal mammary or axillary nodes on either side.  With this information, the patient proceeded to definitive right lumpectomy and sentinel lymph node sampling on 05/16/2008.  The final pathology ([id number]) confirmed a 1.5 cm invasive ductal carcinoma, grade 2, with negative margins, no evidence of lymphovascular  invasion, and 0 of 3 sentinel lymph nodes involved.  The estrogen receptor test was repeated ([id number]), and was again negative with a strong positive control.  Her subsequent history is as detailed below.   INTERVAL HISTORY:  Jeanette Boone returns today for follow-up of her estrogen receptor negative breast cancer. She continues under observation. The interval history is generally unremarkable. She is doing well.   Since her last visit, she underwent routine screening bilateral mammography with CAD and tomography on 02/06/2017 at [facility] showing: breast density category D. There was no evidence of malignancy.   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 She sits a fair amount of the day, but she walks occasionally. She belongs to a gym, but she has not been exercising regularly. She denies unusual headaches, visual changes, nausea, vomiting, or dizziness. There has been no unusual cough, phlegm production, or pleurisy. This been no change in bowel or bladder habits. She denies unexplained fatigue or unexplained weight loss, bleeding, rash, or fever. A detailed review of systems was otherwise stable.    PAST MEDICAL HISTORY: Past Medical History:  Diagnosis Date  . Cancer (HCC)    breast  . Chronic leg pain   . Endometriosis   . Family history of bladder cancer   . Family history of breast cancer   . Leg cramps 11/19/2016  . Obesity   . Paresthesias    Generalized  . 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 Thrombocytopenia, secondary    secondary to valtrex  Obesity.  As stated, the patient has been on a  very strict 1200-1400 calorie diet and 5 days of exercise for over a year, and tells me she has lost over 100 pounds.  She tells me she has done this in the past, and that she loses weight easily, but also gains weight easily.  She is now a bit below where she would like to be, her goal being approximately 140 pounds. 1. She had an episode of cholecystitis,  and underwent cholecystectomy on 06/01/2008 under Dr. Dalbert Batman.  The pathology there ([id number]) showed only chronic cholecystitis and cholelithiasis with a benign cystic duct lymph node. 2. The patient has a history of endometriosis. 3. Status post umbilical hernia repair. 4. The patient tried for many years to get pregnant and took "large doses of fertility drugs" for about 8 or 9 years before giving up on those attempts.       6.   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          7.  History of poorly understood neurologic symptom of  hyperesthesia, controlled on neurontin   PAST SURGICAL HISTORY: Past Surgical History:  Procedure Laterality Date  . BREAST SURGERY  05/2008   lumpectomy - right  . CHOLECYSTECTOMY  05/2008  . COLONOSCOPY  08/2009  . HERNIA REPAIR  5361   umbilical  . HERNIA REPAIR  1996  . LAPAROSCOPY    . port a cath insertion  06/2008  . uterine polyp removal  06/2008   FAMILY HISTORY Family History  Problem Relation Age of Onset  . Heart disease Father        pacemaker----tachycardia  . Stroke Maternal Grandmother   . Bladder Cancer Maternal Grandmother        dx in her 55s  . Heart disease Maternal Grandfather 14       MI  . Stroke Maternal Grandfather   . Heart disease Paternal Grandfather 97       MI  . Hypertension Mother   . Breast cancer Other        MGF's sister   The patient's father died at the age of 4 from causes not quite clear to the patient, but he certainly had significant heart disease.  The patient's mother is alive.  She has a history of bladder cancer.  Both the patient's parents were single children.  The patient has one sister and two brothers.  There is no history of breast cancer in the immediate family.  GYNECOLOGIC HISTORY: She is GX, P0. She was having regular periods at the time of her cancer diagnosis, these were interrupted by chemotherapy, then resumed. Her periods are now irregular. She is  having mild hot flashes.  SOCIAL HISTORY: updated 07/29/2017  Jeanette Boone works as a Music therapist for [employer].  Her husband, [name], is an Chief Financial Officer.  Their two children, Trinna Balloon and Kerri Perches,  were adopted from [place].  The patient attends the [employer].    ADVANCED DIRECTIVES Not in place    HEALTH MAINTENANCE: Social History   Tobacco Use  . Smoking status: Former Research scientist (life sciences)  . Smokeless tobacco: Never Used  Substance Use Topics  . Alcohol use: Not Currently    Comment: 0     Colonoscopy: 2011  PAP: UTD/ Lavoie  Bone density:  Lipid panel:   Allergies  Allergen Reactions  . Clarithromycin     Thrush  . Penicillins Other (See Comments)    Child hood     Current Outpatient  Medications on File Prior to Visit  Medication Sig Dispense Refill  . B Complex Vitamins (B-COMPLEX/B-12 PO) Take by mouth.    . Biotin 1000 MCG tablet Take 1,000 mcg by mouth daily.     . cholecalciferol (VITAMIN D) 1000 UNITS tablet Take 2,000 Units by mouth daily.    . DULoxetine (CYMBALTA) 30 MG capsule TAKE 1 CAPSULE(30 MG) BY MOUTH DAILY 90 capsule 3  . gabapentin (NEURONTIN) 300 MG capsule Take 3 capsules (900 mg total) 4 (four) times daily by mouth. 1080 capsule 3  . Multiple Vitamin (MULTIVITAMIN) tablet Take 1 tablet by mouth daily.    . valACYclovir (VALTREX) 500 MG tablet Take 1 tablet (500 mg total) by mouth daily. 90 tablet 4   No current facility-administered medications on file prior to visit.     OBJECTIVE: Middle-aged white woman no acute distress  Vitals:   07/29/17 1532  BP: 114/70  Pulse: (!) 54  Resp: 17  Temp: 98 F (36.7 C)  TempSrc: Oral  SpO2: 100%  Weight: 156 lb 12.8 oz (71.1 kg)  Height: '5\' 8"'  (1.727 m)   Sclerae unicteric, pupils round and equal Oropharynx clear and moist No cervical or supraclavicular adenopathy Lungs no rales or rhonchi Heart regular rate and rhythm Abd soft, nontender, positive bowel sounds MSK no  focal spinal tenderness, no upper extremity lymphedema Neuro: nonfocal, well oriented, appropriate affect Breasts: The right breast is status post lumpectomy followed by radiation.  There is no evidence of local recurrence.  The left breast is benign.  Both axillae are benign.  LAB RESULTS: CBC    Component Value Date/Time   WBC 4.9 07/28/2017 1217   RBC 3.83 07/28/2017 1217   HGB 12.3 07/28/2017 1217   HGB 12.6 05/30/2016 1230   HCT 38.0 07/28/2017 1217   HCT 37.8 05/30/2016 1230   PLT 228 07/28/2017 1217   PLT 140 (L) 05/30/2016 1230   MCV 99.2 07/28/2017 1217   MCV 101.8 (H) 05/30/2016 1230   MCH 32.1 07/28/2017 1217   MCHC 32.4 07/28/2017 1217   RDW 13.0 07/28/2017 1217   RDW 12.8 05/30/2016 1230   LYMPHSABS 1.6 07/28/2017 1217   LYMPHSABS 1.3 05/30/2016 1230   MONOABS 0.4 07/28/2017 1217   MONOABS 0.2 05/30/2016 1230   EOSABS 0.4 07/28/2017 1217   EOSABS 0.1 05/30/2016 1230   BASOSABS 0.0 07/28/2017 1217   BASOSABS 0.0 05/30/2016 1230    CMP Latest Ref Rng & Units 07/28/2017 03/31/2017 05/30/2016  Glucose 70 - 99 mg/dL 74 90 63(L)  BUN 6 - 20 mg/dL 23(H) 17 15.3  Creatinine 0.44 - 1.00 mg/dL 0.77 0.71 0.9  Sodium 135 - 145 mmol/L 139 138 137  Potassium 3.5 - 5.1 mmol/L 5.1 4.3 4.8  Chloride 98 - 111 mmol/L 103 101 -  CO2 22 - 32 mmol/L 30 34(H) 29  Calcium 8.9 - 10.3 mg/dL 9.5 9.3 9.4  Total Protein 6.5 - 8.1 g/dL 7.1 6.9 6.6  Total Bilirubin 0.3 - 1.2 mg/dL 0.3 0.5 0.47  Alkaline Phos 38 - 126 U/L 90 73 72  AST 15 - 41 U/L 24 29 63(H)  ALT 0 - 44 U/L 25 25 65(H)    STUDIES: Since her last visit, she underwent routine screening bilateral mammography with CAD and tomography on 02/06/2017 at [facility] showing: breast density category D. There was no evidence of malignancy.   ASSESSMENT: 57 y.o.  BRCA negative [place] woman  (1) status post right upper inner quadrant lumpectomy and  sentinel lymph node dissection May 2010 for a T1c N0, stage 1A invasive  ductal carcinoma,  grade 2,  estrogen and progesterone receptor negative, but HER-2/neu positive.  The MIB-1 was 19%.   (2) received 4 cycles of docetaxel/carboplatin/trastuzumab adjuvantly  (3) she continued Herceptin to July 2010 with echocardiogram showing a well-preserved ejection fraction after the completion of treatment.  (4) she completed radiation in December 2010.   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 Sequencing was performed for select regions of POLE and POLD1, and large rearrangement analysis was performed for select regions of GREM1.   (7) persistent mild transaminase elevation, with negative hepatitis and HIV serologies and no evidence of metastases by CT scan of the chest 02/28/2016  (a) hepatic steatosis noted on abdominal MRI/MRCP 10/21/2016  (8) iron deficiency, status post Feraheme 03/08/2016  (9) category D breast density   PLAN:  Jeanette Boone is now 9 years out from definitive surgery for breast cancer with no evidence of disease recurrence.  This is very favorable.  I am concerned that her breasts are not getting any less dense.  She has not had a breast MRI in some time.  I think it would be prudent to obtain one and we discussed that at length today.  She is agreeable and I have placed the order.  Note that there is a new indication from the [employer] of radiology that patients with a history of breast cancer and very dense breast should consider regular MRIs of the breast for additional screening  Unfortunately her hyperesthesia has never cleared.  She is managing this with gabapentin.  She is  able to hold a complex job despite the high dose that she takes  I encouraged her to increase her exercise program.  She will see me again in 1 year.  She knows to call for any problems that may develop before the next visit. Magrinat, Virgie Dad, MD  07/29/17 3:45 PM Medical Oncology and Hematology [facility] [address] [place], [place] [address] Tel. [phone]    Fax. [phone]  Alice Rieger, am acting as scribe for Chauncey Cruel MD.  I, Lurline Del MD, have reviewed the above documentation for accuracy and completeness, and I agree with the above.

## 2017-07-29 ENCOUNTER — Inpatient Hospital Stay (HOSPITAL_BASED_OUTPATIENT_CLINIC_OR_DEPARTMENT_OTHER): Payer: Managed Care, Other (non HMO) | Admitting: Oncology

## 2017-07-29 ENCOUNTER — Telehealth: Payer: Self-pay | Admitting: Oncology

## 2017-07-29 VITALS — BP 114/70 | HR 54 | Temp 98.0°F | Resp 17 | Ht 68.0 in | Wt 156.8 lb

## 2017-07-29 DIAGNOSIS — Z853 Personal history of malignant neoplasm of breast: Secondary | ICD-10-CM

## 2017-07-29 DIAGNOSIS — Z171 Estrogen receptor negative status [ER-]: Principal | ICD-10-CM

## 2017-07-29 DIAGNOSIS — R922 Inconclusive mammogram: Secondary | ICD-10-CM

## 2017-07-29 DIAGNOSIS — R203 Hyperesthesia: Secondary | ICD-10-CM | POA: Diagnosis not present

## 2017-07-29 DIAGNOSIS — C50211 Malignant neoplasm of upper-inner quadrant of right female breast: Secondary | ICD-10-CM

## 2017-07-29 NOTE — Telephone Encounter (Signed)
Patient declined avs and calendar  °

## 2017-08-12 ENCOUNTER — Ambulatory Visit
Admission: RE | Admit: 2017-08-12 | Discharge: 2017-08-12 | Disposition: A | Discharge status: Home / Self Care | Payer: Managed Care, Other (non HMO) | Source: Ambulatory Visit | Attending: Oncology | Admitting: Oncology

## 2017-08-12 DIAGNOSIS — R922 Inconclusive mammogram: Secondary | ICD-10-CM

## 2017-08-12 DIAGNOSIS — C50211 Malignant neoplasm of upper-inner quadrant of right female breast: Secondary | ICD-10-CM

## 2017-08-12 DIAGNOSIS — Z171 Estrogen receptor negative status [ER-]: Principal | ICD-10-CM

## 2017-08-12 MED ORDER — GADOBENATE DIMEGLUMINE 529 MG/ML IV SOLN
14.0000 mL | Freq: Once | INTRAVENOUS | Status: AC | PRN
  Administered 2017-08-12: 14 mL via INTRAVENOUS

## 2017-08-15 ENCOUNTER — Other Ambulatory Visit: Payer: Self-pay | Admitting: Obstetrics & Gynecology

## 2017-08-15 NOTE — Telephone Encounter (Signed)
annual on 08/26/17

## 2017-08-26 ENCOUNTER — Encounter: Payer: Managed Care, Other (non HMO) | Admitting: Obstetrics & Gynecology

## 2017-08-26 IMAGING — MG MM DIAG BREAST TOMO BILATERAL
6 of 9 series · 6 of 25 positions shown · non-contrast
Comparison: Previous exam(s).

CLINICAL DATA: Patient is a currently asymptomatic 54-year-old
female with a personal history of right breast cancer status post
conservation therapy with lumpectomy, radiation and chemotherapy in
9171.

EXAM:
DIGITAL DIAGNOSTIC BILATERAL MAMMOGRAM WITH 3D TOMOSYNTHESIS AND CAD

[R MLO (1 of 2)]
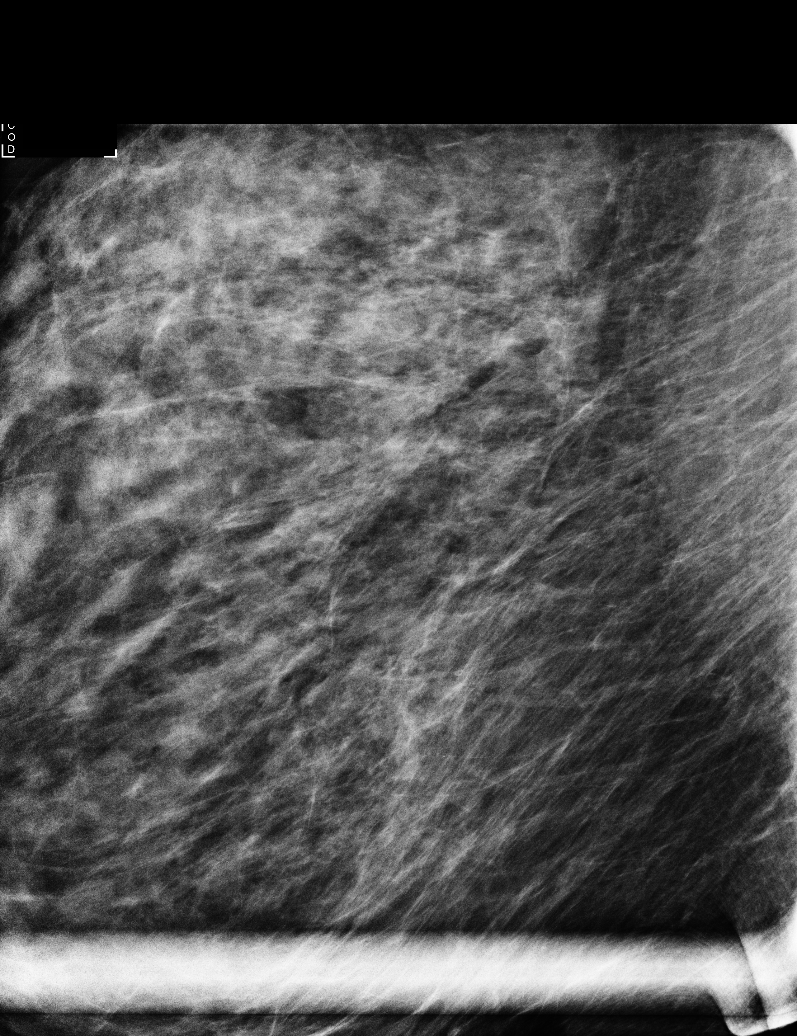

[L CC]
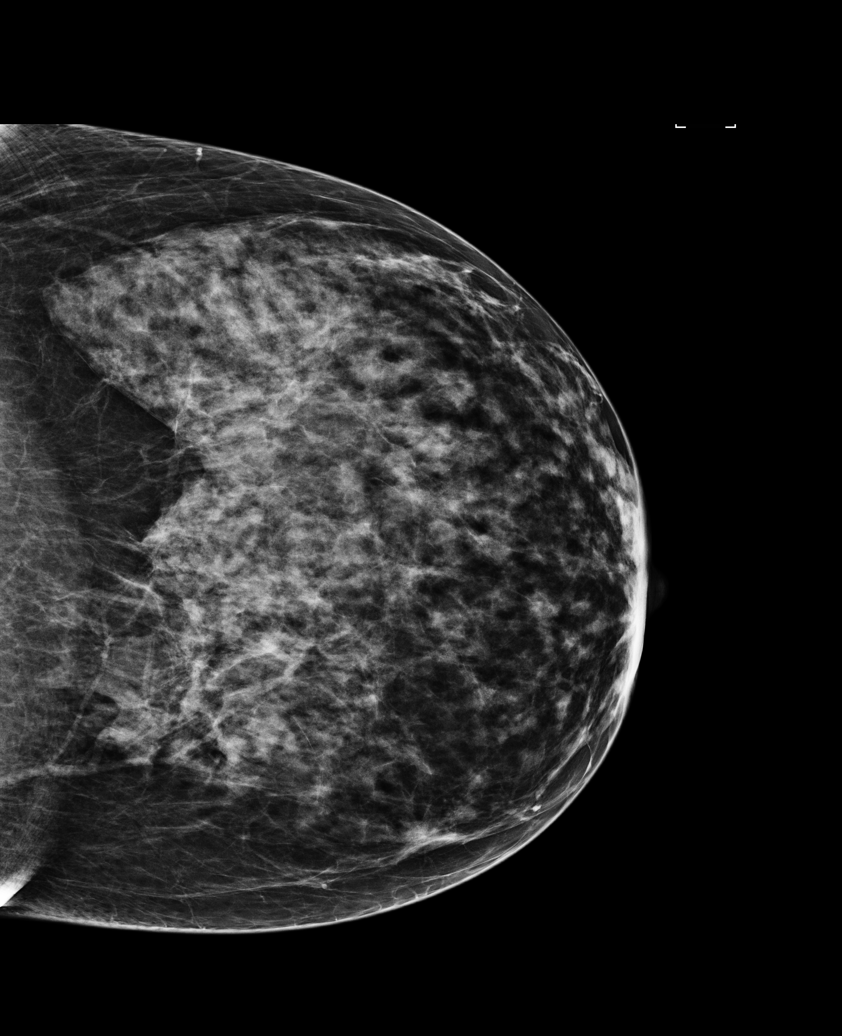

[R CC]
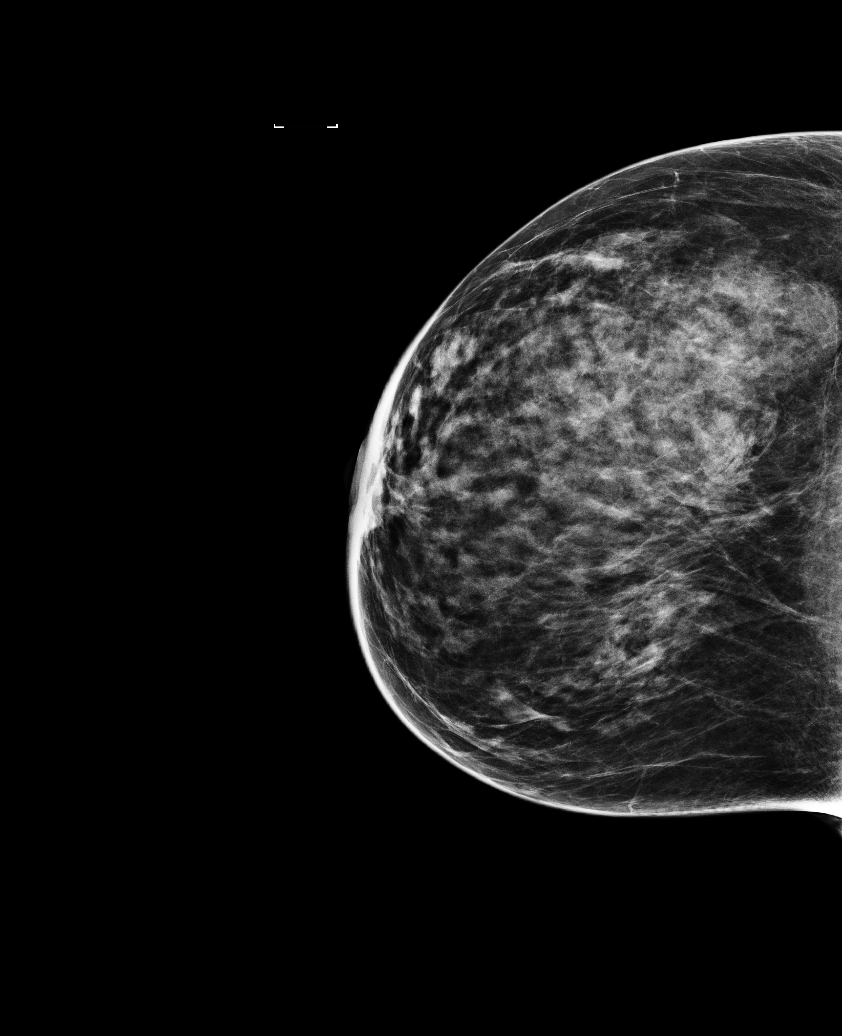

[L MLO]
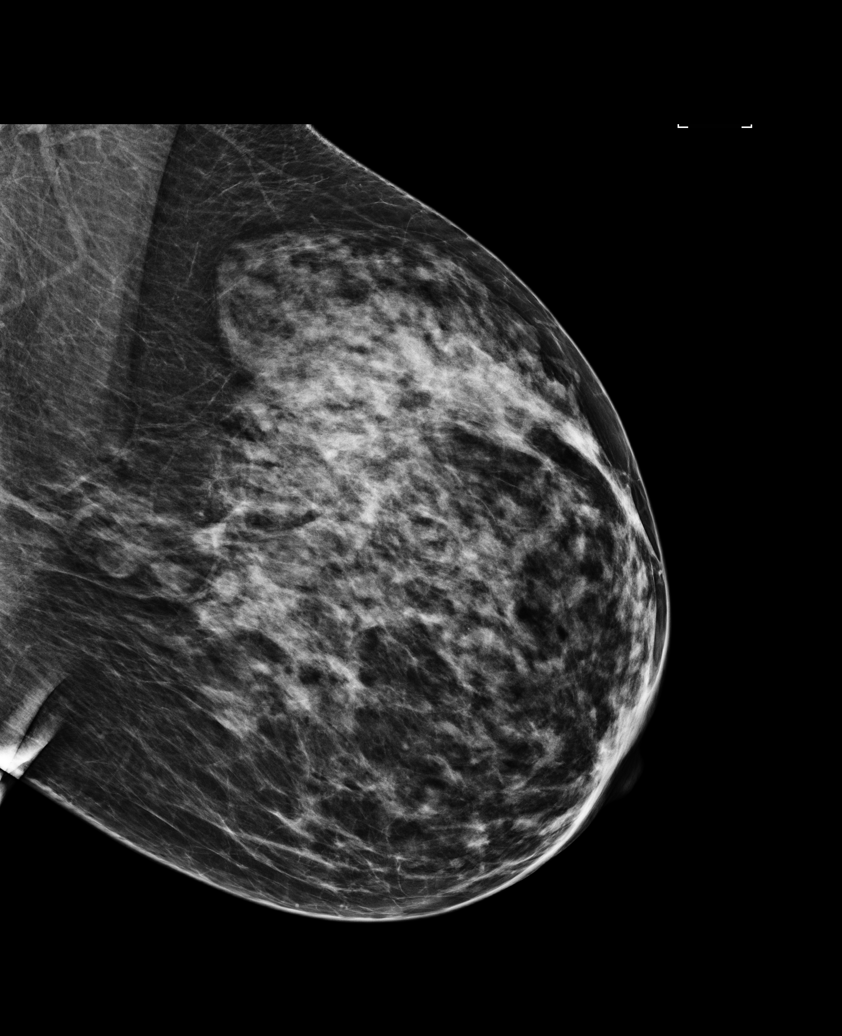

[R MLO (2 of 2)]
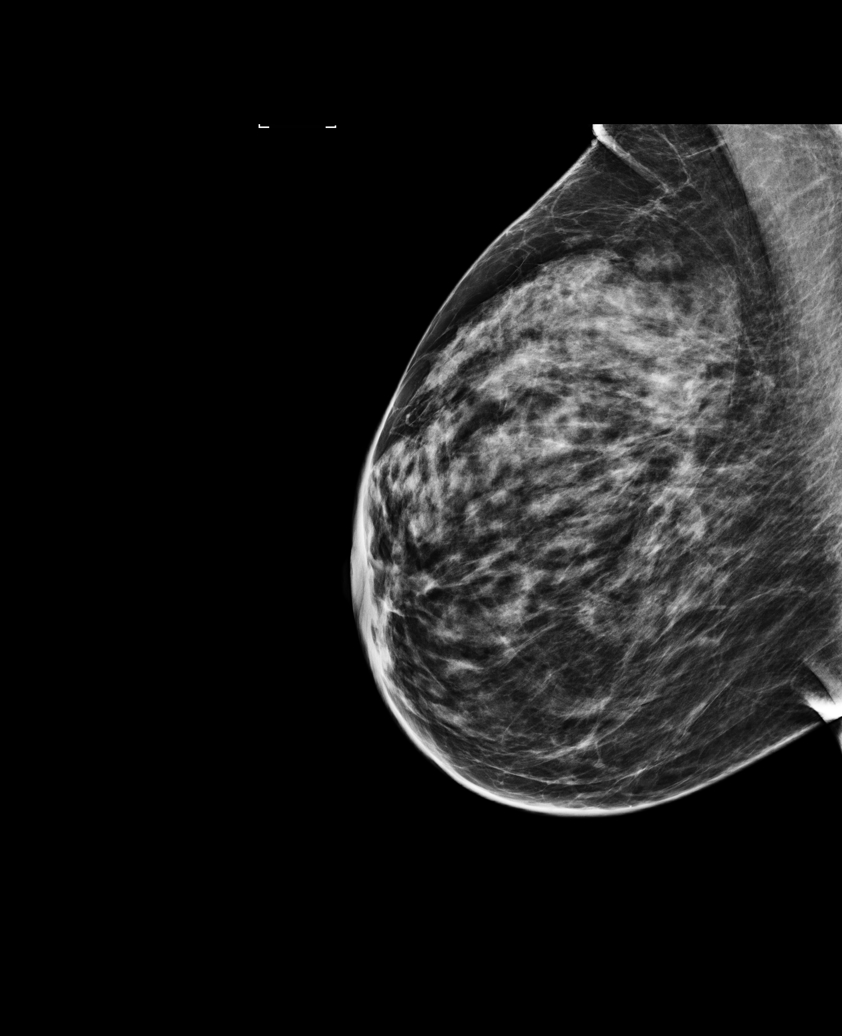

[R MLO tomo · tomo slice 34/67.0]
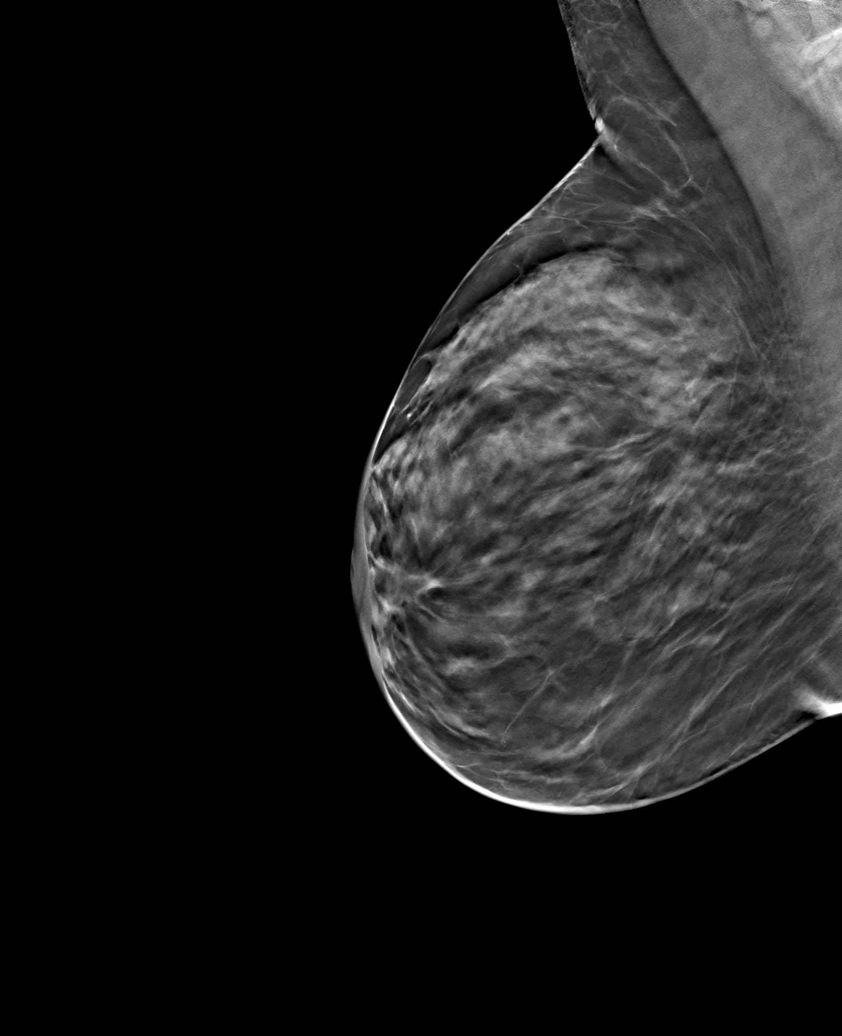

[6 of 25 positions shown; findings below may reference images not displayed]

ACR Breast Density Category c: The breast tissue is heterogeneously
dense, which may obscure small masses.
FINDINGS: Digital bilateral diagnostic mammography demonstrates a
heterogeneously dense parenchymal pattern which may obscure
detection of small masses. Mild postsurgical scarring is again
identified in the posterior central right breast at site of
lumpectomy. Spot-compression magnification view of the lumpectomy
bed demonstrates effacement of normal fibroglandular tissue. No new
mass or suspicious microcalcification. There has been no significant
interval change from comparison mammograms.

Mammographic images were processed with CAD.
IMPRESSION: Post lumpectomy scarring of the right breast without mammographic
findings of malignancy.

RECOMMENDATION:
Diagnostic mammogram is suggested in 1 year. (Code:MT-K-002)

I have discussed the findings and recommendations with the patient.
Results were also provided in writing at the conclusion of the
visit. If applicable, a reminder letter will be sent to the patient
regarding the next appointment.

BI-RADS CATEGORY  2: Benign.

## 2017-11-12 ENCOUNTER — Other Ambulatory Visit: Payer: Self-pay | Admitting: Obstetrics & Gynecology

## 2017-11-20 ENCOUNTER — Other Ambulatory Visit: Payer: Self-pay

## 2017-11-20 ENCOUNTER — Ambulatory Visit (INDEPENDENT_AMBULATORY_CARE_PROVIDER_SITE_OTHER): Payer: Managed Care, Other (non HMO) | Admitting: Neurology

## 2017-11-20 ENCOUNTER — Encounter: Payer: Self-pay | Admitting: Neurology

## 2017-11-20 VITALS — BP 136/80 | HR 78 | Resp 16 | Ht 68.0 in | Wt 191.0 lb

## 2017-11-20 DIAGNOSIS — G549 Nerve root and plexus disorder, unspecified: Secondary | ICD-10-CM | POA: Diagnosis not present

## 2017-11-20 MED ORDER — DULOXETINE HCL 30 MG PO CPEP
ORAL_CAPSULE | ORAL | 3 refills | Status: DC
Start: 2017-11-20 — End: 2018-02-12

## 2017-11-20 MED ORDER — GABAPENTIN 300 MG PO CAPS: 900.0000 mg | ORAL_CAPSULE | Freq: Four times a day (QID) | ORAL | 3 refills | Status: DC

## 2017-11-20 NOTE — Progress Notes (Signed)
Reason for visit: Sensory neuronopathy  Jeanette Boone is an 57 y.o. female  History of present illness:  Jeanette Boone is a 57 year old right-handed white female with a history of breast cancer in the past.  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  The patient has been stable for many years, she is on high-dose gabapentin and low-dose Cymbalta.  The patient does have some mild cognitive side effects on the gabapentin and some mild gait instability, she reports no falls.  She has hypersensitivity of the skin to light touch proximally and distally.  She recently has had some tingling sensations in the eyelids.  She does not wish to alter her medication dosing, she is tolerating the pain levels fairly well.  The patient generally is able to sleep well at night.  She returns to this office for an evaluation.  Previously, nerve conduction studies were normal in 2014, again the patient may potentially have a neuronopathy not a neuropathy.  Past Medical History:  Diagnosis Date  . Cancer (HCC)    breast  . Chronic leg pain   . Endometriosis   . Family history of bladder cancer   . Family history of breast cancer   . Leg cramps 11/19/2016  . Obesity   . Paresthesias    Generalized  . 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 Thrombocytopenia, secondary    secondary to valtrex    Past Surgical History:  Procedure Laterality Date  . BREAST SURGERY  05/2008   lumpectomy - right  . CHOLECYSTECTOMY  05/2008  . COLONOSCOPY  08/2009  . HERNIA REPAIR  3212   umbilical  . HERNIA REPAIR  1996  . LAPAROSCOPY    . port a cath insertion  06/2008  . uterine polyp removal  06/2008    Family History  Problem Relation Age of Onset  . Heart disease Father        pacemaker----tachycardia  . Stroke Maternal  Grandmother   . Bladder Cancer Maternal Grandmother        dx in her 36s  . Heart disease Maternal Grandfather 88       MI  . Stroke Maternal Grandfather   . Heart disease Paternal Grandfather 50       MI  . Hypertension Mother   . Breast cancer Other        MGF's sister    Social history:  reports that she has quit smoking. She has never used smokeless tobacco. She reports that she drank alcohol. She reports that she does not use drugs.    Allergies  Allergen Reactions  . Clarithromycin     Thrush  . Penicillins Other (See Comments)    Child hood     Medications:  Prior to Admission medications   Medication Sig Start Date End Date Taking? Authorizing Provider  B Complex Vitamins (B-COMPLEX/B-12 PO) Take by mouth.   Yes [provider]  Biotin 1000 MCG tablet Take 1,000 mcg by mouth daily.    Yes [provider]  cholecalciferol (VITAMIN D) 1000 UNITS tablet Take 2,000 Units by mouth daily.   Yes [provider]  DULoxetine (CYMBALTA) 30 MG capsule TAKE 1 CAPSULE(30 MG) BY MOUTH DAILY 11/19/16  Yes Kathrynn Ducking, MD  gabapentin (NEURONTIN) 300 MG capsule Take 3 capsules (900 mg total) 4 (  four) times daily by mouth. 11/19/16  Yes Kathrynn Ducking, MD  Multiple Vitamin (MULTIVITAMIN) tablet Take 1 tablet by mouth daily.   Yes [provider]  valACYclovir (VALTREX) 500 MG tablet TAKE 1 TABLET(500 MG) BY MOUTH DAILY 11/14/17  Yes Princess Bruins, MD    ROS:  Out of a complete 14 system review of symptoms, the patient complains only of the following symptoms, and all other reviewed systems are negative.  Numbness Burning  Blood pressure 136/80, pulse 78, resp. rate 16, height 5\' 8"  (1.727 m), weight 191 lb (86.6 kg), last menstrual period 05/13/2010.  Physical Exam  General: The patient is alert and cooperative at the time of the examination.  Skin: No significant peripheral edema is noted.   Neurologic Exam  Mental status:  The patient is alert and oriented x 3 at the time of the examination. The patient has apparent normal recent and remote memory, with an apparently normal attention span and concentration ability.   Cranial nerves: Facial symmetry is present. Speech is normal, no aphasia or dysarthria is noted. Extraocular movements are full. Visual fields are full.  Motor: The patient has good strength in all 4 extremities.  Sensory examination: Soft touch sensation is symmetric on the face, arms, and legs.  Coordination: The patient has good finger-nose-finger and heel-to-shin bilaterally.  Gait and station: The patient has a normal gait. Tandem gait is normal. Romberg is negative. No drift is seen.  Reflexes: Deep tendon reflexes are symmetric.   Assessment/Plan:  1.  Sensory alteration, sensory neuronopathy  The patient will continue the gabapentin and Cymbalta, prescriptions were sent in, she will follow-up in 1 year.  Jill Alexanders MD 11/20/2017 8:02 AM  [facility] [address] [place], [place] [address]  Phone [phone] Fax [phone]

## 2017-11-28 ENCOUNTER — Encounter: Payer: Managed Care, Other (non HMO) | Admitting: Obstetrics & Gynecology

## 2018-02-11 ENCOUNTER — Other Ambulatory Visit: Payer: Self-pay | Admitting: Neurology

## 2018-02-12 ENCOUNTER — Encounter: Payer: Managed Care, Other (non HMO) | Admitting: Obstetrics & Gynecology

## 2018-02-17 ENCOUNTER — Telehealth: Payer: Self-pay | Admitting: Neurology

## 2018-02-17 MED ORDER — DULOXETINE HCL 30 MG PO CPEP: ORAL_CAPSULE | ORAL | 3 refills | Status: DC

## 2018-02-17 NOTE — Telephone Encounter (Signed)
Chart reviewed, refill appropriate. Rx submitted to [facility] on [address] for brand name Cymbalta via e-script.

## 2018-02-17 NOTE — Telephone Encounter (Signed)
Pt is needing a refill for her DULoxetine (CYMBALTA) 30 MG capsule 90 day supply sent to [facility] on [address] name only. Pt states the generic does not work well with her. Pt ran out yesterday.

## 2018-02-23 ENCOUNTER — Encounter: Payer: Self-pay | Admitting: Family Medicine

## 2018-02-23 ENCOUNTER — Ambulatory Visit (INDEPENDENT_AMBULATORY_CARE_PROVIDER_SITE_OTHER): Payer: Managed Care, Other (non HMO) | Admitting: Family Medicine

## 2018-02-23 VITALS — BP 120/62 | HR 54 | Ht 67.0 in | Wt 213.0 lb

## 2018-02-23 DIAGNOSIS — E559 Vitamin D deficiency, unspecified: Secondary | ICD-10-CM | POA: Diagnosis not present

## 2018-02-23 DIAGNOSIS — R200 Anesthesia of skin: Secondary | ICD-10-CM

## 2018-02-23 DIAGNOSIS — R202 Paresthesia of skin: Secondary | ICD-10-CM

## 2018-02-23 LAB — COMPREHENSIVE METABOLIC PANEL
ALBUMIN: 4.2 g/dL (ref 3.5–5.2)
ALK PHOS: 127 U/L — AB (ref 39–117)
ALT: 20 U/L (ref 0–35)
AST: 22 U/L (ref 0–37)
BUN: 13 mg/dL (ref 6–23)
CO2: 31 mEq/L (ref 19–32)
CREATININE: 0.89 mg/dL (ref 0.40–1.20)
Calcium: 9.7 mg/dL (ref 8.4–10.5)
Chloride: 103 mEq/L (ref 96–112)
GFR: 65.41 mL/min (ref >60.00)
GLUCOSE: 87 mg/dL (ref 70–99)
Potassium: 5.4 mEq/L — ABNORMAL HIGH (ref 3.5–5.1)
SODIUM: 140 meq/L (ref 135–145)
TOTAL PROTEIN: 6.7 g/dL (ref 6.0–8.3)
Total Bilirubin: 0.5 mg/dL (ref 0.2–1.2)

## 2018-02-23 LAB — CBC WITH DIFFERENTIAL/PLATELET
BASOS ABS: 0 10*3/uL (ref 0.0–0.1)
Basophils Relative: 1 % (ref 0.0–3.0)
EOS ABS: 0.1 10*3/uL (ref 0.0–0.7)
Eosinophils Relative: 2.3 % (ref 0.0–5.0)
HEMATOCRIT: 39.7 % (ref 36.0–46.0)
HEMOGLOBIN: 13.3 g/dL (ref 12.0–15.0)
LYMPHS PCT: 32.4 % (ref 12.0–46.0)
Lymphs Abs: 1.4 10*3/uL (ref 0.7–4.0)
MCHC: 33.5 g/dL (ref 30.0–36.0)
MCV: 96 fl (ref 78.0–100.0)
MONOS PCT: 9.9 % (ref 3.0–12.0)
Monocytes Absolute: 0.4 10*3/uL (ref 0.1–1.0)
Neutro Abs: 2.4 10*3/uL (ref 1.4–7.7)
Neutrophils Relative %: 54.4 % (ref 43.0–77.0)
Platelets: 258 10*3/uL (ref 150.0–400.0)
RBC: 4.14 Mil/uL (ref 3.87–5.11)
RDW: 12.8 % (ref 11.5–15.5)
WBC: 4.4 10*3/uL (ref 4.0–10.5)

## 2018-02-23 LAB — HEMOGLOBIN A1C: Hgb A1c MFr Bld: 5.2 % (ref 4.6–6.5)

## 2018-02-23 NOTE — Patient Instructions (Signed)

## 2018-02-23 NOTE — Progress Notes (Signed)
Patient ID: Jeanette Boone, female    DOB: 06-18-1960  Age: 58 y.o. MRN: [id number]    Subjective:  Subjective  HPI Jeanette Boone presents for numbness and tingling in arms / hands  This has been going on for years but recently has gotten worse.  She has not called Dr Jannifer Franklin'  Office yet .   She is asking for some labs to be done   Review of Systems  Constitutional: Negative for appetite change, diaphoresis, fatigue and unexpected weight change.  Eyes: Negative for pain, redness and visual disturbance.  Respiratory: Negative for cough, chest tightness, shortness of breath and wheezing.   Cardiovascular: Negative for chest pain, palpitations and leg swelling.  Endocrine: Negative for cold intolerance, heat intolerance, polydipsia, polyphagia and polyuria.  Genitourinary: Negative for difficulty urinating, dysuria and frequency.  Neurological: Positive for weakness and numbness. Negative for dizziness, light-headedness and headaches.    History Past Medical History:  Diagnosis Date  . Cancer (HCC)    breast  . Chronic leg pain   . Endometriosis   . Family history of bladder cancer   . Family history of breast cancer   . Leg cramps 11/19/2016  . Obesity   . Paresthesias    Generalized  . 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 Thrombocytopenia, secondary    secondary to valtrex    She has a past surgical history that includes Hernia repair (1995); Hernia repair (1996); Cholecystectomy (05/2008); uterine polyp removal (06/2008); Colonoscopy (08/2009); port a cath insertion (06/2008); Breast surgery (05/2008); and laparoscopy.   Her family history includes Bladder Cancer in her maternal grandmother; Breast cancer in an other family member; Heart disease in her father; Heart disease (age of onset: 52) in her maternal grandfather; Heart disease (age of onset: 44) in her paternal grandfather; Hypertension in her mother; Stroke in her maternal grandfather and maternal grandmother.She reports that she  has quit smoking. She has never used smokeless tobacco. She reports previous alcohol use. She reports that she does not use drugs.  Current Outpatient Medications on File Prior to Visit  Medication Sig Dispense Refill  . B Complex Vitamins (B-COMPLEX/B-12 PO) Take by mouth.    . Biotin 1000 MCG tablet Take 1,000 mcg by mouth daily.     . cholecalciferol (VITAMIN D) 1000 UNITS tablet Take 2,000 Units by mouth daily.    . DULoxetine (CYMBALTA) 30 MG capsule TAKE ONE CAPSULE BY MOUTH EVERY DAY 90 capsule 3  . gabapentin (NEURONTIN) 300 MG capsule TAKE 3 CAPSULES BY MOUTH FOUR TIMES DAILY 1080 capsule 3  . Multiple Vitamin (MULTIVITAMIN) tablet Take 1 tablet by mouth daily.    . valACYclovir (VALTREX) 500 MG tablet TAKE 1 TABLET(500 MG) BY MOUTH DAILY 90 tablet 3   No current facility-administered medications on file prior to visit.      Objective:  Objective  Physical Exam Vitals signs and nursing note reviewed.  Constitutional:      Appearance: She is well-developed.  HENT:     Head: Normocephalic and atraumatic.  Eyes:     Conjunctiva/sclera: Conjunctivae normal.  Neck:     Musculoskeletal: Normal range of motion and neck supple.     Thyroid: No thyromegaly.     Vascular: No carotid bruit or JVD.  Cardiovascular:     Rate and Rhythm: Normal rate and regular rhythm.     Heart sounds: Normal heart sounds. No murmur.  Pulmonary:     Effort: Pulmonary effort is normal. No respiratory distress.  Breath sounds: Normal breath sounds. No wheezing or rales.  Chest:     Chest wall: No tenderness.  Neurological:     Mental Status: She is alert and oriented to person, place, and time.    BP 120/62   Pulse (!) 54   Ht 5\' 7"  (1.702 m)   Wt 213 lb (96.6 kg)   LMP 05/13/2010   SpO2 96%   BMI 33.36 kg/m  Wt Readings from Last 3 Encounters:  02/23/18 213 lb (96.6 kg)  11/20/17 191 lb (86.6 kg)  07/29/17 156 lb 12.8 oz (71.1 kg)     Lab Results  Component Value Date   WBC 4.9  07/28/2017   HGB 12.3 07/28/2017   HCT 38.0 07/28/2017   PLT 228 07/28/2017   GLUCOSE 74 07/28/2017   CHOL 213 (H) 03/31/2017   TRIG 55.0 03/31/2017   HDL 93.90 03/31/2017   LDLDIRECT 140.9 12/18/2011   LDLCALC 109 (H) 03/31/2017   ALT 25 07/28/2017   AST 24 07/28/2017   NA 139 07/28/2017   K 5.1 07/28/2017   CL 103 07/28/2017   CREATININE 0.77 07/28/2017   BUN 23 (H) 07/28/2017   CO2 30 07/28/2017   TSH 2.30 03/31/2017   HGBA1C 5.6 03/26/2013    Mr Breast Bilateral W Wo Contrast Inc Cad  Result Date: 08/12/2017 CLINICAL DATA:  58 year old female with new bilateral breast pain and RIGHT breast discomfort. High lifetime risk for developing breast cancer and very dense breasts. Personal history of RIGHT breast cancer, lumpectomy, radiation and chemotherapy in 2010. LABS:  None performed today EXAM: BILATERAL BREAST MRI WITH AND WITHOUT CONTRAST TECHNIQUE: Multiplanar, multisequence MR images of both breasts were obtained prior to and following the intravenous administration of 14 ml of MultiHance. THREE-DIMENSIONAL MR IMAGE RENDERING ON INDEPENDENT WORKSTATION: Three-dimensional MR images were rendered by post-processing of the original MR data on an independent workstation. The three-dimensional MR images were interpreted, and findings are reported in the following complete MRI report for this study. Three dimensional images were evaluated at the independent DynaCad workstation COMPARISON:  Previous exam(s). FINDINGS: Breast composition: c. Heterogeneous fibroglandular tissue. Background parenchymal enhancement: Mild Right breast: No mass or abnormal enhancement. RIGHT breast lumpectomy changes again noted. Left breast: No mass or abnormal enhancement. Lymph nodes: No abnormal appearing lymph nodes. Ancillary findings:  None. IMPRESSION: No MR evidence of breast malignancy. RIGHT breast lumpectomy changes. RECOMMENDATION: Bilateral screening mammograms in January 2020 to resume annual mammogram  schedule. Consider bilateral screening breast MRI in 1 year in this high risk patient. BI-RADS CATEGORY  2: Benign. Electronically Signed   By: Margarette Canada M.D.   On: 08/12/2017 14:17     Assessment & Plan:  Plan  I am having Jeanette Boone maintain her cholecalciferol, multivitamin, B Complex Vitamins (B-COMPLEX/B-12 PO), Biotin, valACYclovir, gabapentin, and DULoxetine.  No orders of the defined types were placed in this encounter.   Problem List Items Addressed This Visit    None    Visit Diagnoses    Numbness and tingling    -  Primary   Relevant Orders   CBC with Differential/Platelet   TSH   Vitamin B12   Comprehensive metabolic panel   Hemoglobin A1c   Vitamin D deficiency       Relevant Orders   Vitamin D (25 hydroxy)    recommend pt call neuro  Labs checked con't gabepentin  Follow-up: Return in about 3 months (around 05/24/2018), or if symptoms worsen or fail to improve, for  annual exam, fasting.  Ann Held, DO    +

## 2018-02-24 LAB — TSH: TSH: 2.21 u[IU]/mL (ref 0.35–4.50)

## 2018-02-25 LAB — VITAMIN B12: VITAMIN B 12: 433 pg/mL (ref 211–911)

## 2018-02-25 LAB — VITAMIN D 25 HYDROXY (VIT D DEFICIENCY, FRACTURES): VITD: 51.23 ng/mL (ref 30.00–100.00)

## 2018-03-03 ENCOUNTER — Encounter: Payer: Self-pay | Admitting: *Deleted

## 2018-03-19 ENCOUNTER — Other Ambulatory Visit: Payer: Self-pay | Admitting: Family Medicine

## 2018-03-19 ENCOUNTER — Other Ambulatory Visit: Payer: Self-pay | Admitting: Oncology

## 2018-03-19 DIAGNOSIS — Z1231 Encounter for screening mammogram for malignant neoplasm of breast: Secondary | ICD-10-CM

## 2018-03-20 ENCOUNTER — Ambulatory Visit
Admission: RE | Admit: 2018-03-20 | Discharge: 2018-03-20 | Disposition: A | Discharge status: Home / Self Care | Payer: Managed Care, Other (non HMO) | Source: Ambulatory Visit | Attending: Family Medicine | Admitting: Family Medicine

## 2018-03-20 ENCOUNTER — Other Ambulatory Visit: Payer: Self-pay

## 2018-03-20 DIAGNOSIS — Z1231 Encounter for screening mammogram for malignant neoplasm of breast: Secondary | ICD-10-CM

## 2018-04-21 ENCOUNTER — Telehealth: Payer: Self-pay | Admitting: Neurology

## 2018-04-21 NOTE — Telephone Encounter (Signed)
04-21-2018 Pt has given verbal consent for insurance to be filled for a Virtual visit on 04-29-2018 pt email:[email or web address]  pt asked to download [employer] webex app/program [email or web address]://[email or web address]

## 2018-04-21 NOTE — Telephone Encounter (Signed)
Webex e-mail invitation submitted to the e-mail listed. Pre charting will be completed closer to visit.

## 2018-04-28 ENCOUNTER — Encounter: Payer: Managed Care, Other (non HMO) | Admitting: Obstetrics & Gynecology

## 2018-04-28 ENCOUNTER — Telehealth: Payer: Self-pay

## 2018-04-28 NOTE — Telephone Encounter (Signed)
I updated the pt's meds, allergies  and PMH.   Pt understands that although there may be some limitations with this type of visit, we will take all precautions to reduce any security or privacy concerns.  Pt understands that this will be treated like an in office visit and we will file with pt's insurance, and there may be a patient responsible charge related to this service.  Pt's email is [email or web address]. Pt understands that the [employer] webex software must be downloaded and operational on the device pt plans to use for the visit.

## 2018-04-29 ENCOUNTER — Other Ambulatory Visit: Payer: Self-pay

## 2018-04-29 ENCOUNTER — Encounter: Payer: Self-pay | Admitting: Neurology

## 2018-04-29 ENCOUNTER — Ambulatory Visit (INDEPENDENT_AMBULATORY_CARE_PROVIDER_SITE_OTHER): Payer: Managed Care, Other (non HMO) | Admitting: Neurology

## 2018-04-29 DIAGNOSIS — G549 Nerve root and plexus disorder, unspecified: Secondary | ICD-10-CM | POA: Diagnosis not present

## 2018-04-29 NOTE — Progress Notes (Addendum)
    Virtual Visit via Video Note  I connected with Jeanette Boone on 04/29/18 at  9:00 AM EDT by a video enabled telemedicine application and verified that I am speaking with the correct person using two identifiers.   I discussed the limitations of evaluation and management by telemedicine and the availability of in person appointments. The patient expressed understanding and agreed to proceed.  The patient was at home, physician in the office  History of Present Illness: Jeanette Boone is a 58 year old right-handed white female with a history of breast cancer.  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  Within the last 4 months or so, she has had increased sensory symptoms after years of stability of symptoms.  She is on high-dose gabapentin and on low-dose Cymbalta for her neuropathy pain.  The patient has had increased tingling and burning sensations, she will have sensations around the lips and in the buttocks area and primarily in the hands and feet.  The patient has not had any significant balance changes.  She is having difficulty resting at night because of some of these sensory complaints.  She has not had any weakness.  Prior nerve conduction studies done in 2014 were normal, sensory latencies were normal.  The patient claimed that she had antibody testing for paraneoplastic syndrome in the past with Dr. Erling Cruz, the paraneoplastic antibodies were negative.   Observations/Objective: WebEx evaluation reveals that the patient is alert and cooperative, facial symmetry is present.  Extraocular wounds are full.  Speech is well enunciated.  No aphasia or dysarthria is noted.  Assessment and Plan: 1.  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  Increased  screening for breast cancer should be undertaken, the patient claims that she had a recent mammogram.  The patient will be seeing her oncologist in July 2020.  The patient will be sent for further blood work.  At some point, nerve conduction studies on both the legs and one arm and EMG on one leg will be done.  The patient will follow-up in 4 months.  The patient does not wish to go up on her medications for her neuropathy at this time.  Follow Up Instructions: 47-month follow-up with me.   I discussed the assessment and treatment plan with the patient. The patient was provided an opportunity to ask questions and all were answered. The patient agreed with the plan and demonstrated an understanding of the instructions.   The patient was advised to call back or seek an in-person evaluation if the symptoms worsen or if the condition fails to improve as anticipated.  I provided 30 minutes of non-face-to-face time during this encounter.   Kathrynn Ducking, MD

## 2018-05-14 ENCOUNTER — Other Ambulatory Visit (INDEPENDENT_AMBULATORY_CARE_PROVIDER_SITE_OTHER): Payer: Self-pay

## 2018-05-14 ENCOUNTER — Other Ambulatory Visit: Payer: Self-pay

## 2018-05-14 DIAGNOSIS — G549 Nerve root and plexus disorder, unspecified: Secondary | ICD-10-CM

## 2018-05-14 DIAGNOSIS — Z0289 Encounter for other administrative examinations: Secondary | ICD-10-CM

## 2018-05-18 LAB — MULTIPLE MYELOMA PANEL, SERUM
Albumin SerPl Elph-Mcnc: 3.6 g/dL (ref 2.9–4.4)
Albumin/Glob SerPl: 1.3 (ref 0.7–1.7)
Alpha 1: 0.2 g/dL (ref 0.0–0.4)
Alpha2 Glob SerPl Elph-Mcnc: 0.5 g/dL (ref 0.4–1.0)
B-Globulin SerPl Elph-Mcnc: 1 g/dL (ref 0.7–1.3)
Gamma Glob SerPl Elph-Mcnc: 1.1 g/dL (ref 0.4–1.8)
Globulin, Total: 2.8 g/dL (ref 2.2–3.9)
IgA/Immunoglobulin A, Serum: 98 mg/dL (ref 87–352)
IgG (Immunoglobin G), Serum: 1095 mg/dL (ref 586–1602)
IgM (Immunoglobulin M), Srm: 197 mg/dL (ref 26–217)
Total Protein: 6.4 g/dL (ref 6.0–8.5)

## 2018-05-18 LAB — ANGIOTENSIN CONVERTING ENZYME: Angio Convert Enzyme: 41 U/L (ref 14–82)

## 2018-05-18 LAB — HIV ANTIBODY (ROUTINE TESTING W REFLEX): HIV Screen 4th Generation wRfx: NONREACTIVE

## 2018-05-18 LAB — COPPER, SERUM: Copper: 116 ug/dL (ref 72–166)

## 2018-05-18 LAB — ANA W/REFLEX: Anti Nuclear Antibody (ANA): NEGATIVE

## 2018-05-18 LAB — B. BURGDORFI ANTIBODIES: Lyme IgG/IgM Ab: <0.91 {ISR} (ref 0.00–0.90)

## 2018-05-18 LAB — RPR: RPR Ser Ql: NONREACTIVE

## 2018-05-18 LAB — SEDIMENTATION RATE: Sed Rate: 16 mm/hr (ref 0–40)

## 2018-05-26 ENCOUNTER — Encounter: Payer: Managed Care, Other (non HMO) | Admitting: Family Medicine

## 2018-06-16 ENCOUNTER — Telehealth: Payer: Self-pay

## 2018-06-16 ENCOUNTER — Other Ambulatory Visit: Payer: Self-pay

## 2018-06-16 ENCOUNTER — Ambulatory Visit (INDEPENDENT_AMBULATORY_CARE_PROVIDER_SITE_OTHER): Payer: Managed Care, Other (non HMO) | Admitting: Neurology

## 2018-06-16 ENCOUNTER — Telehealth: Payer: Self-pay | Admitting: Neurology

## 2018-06-16 ENCOUNTER — Encounter: Payer: Self-pay | Admitting: Neurology

## 2018-06-16 DIAGNOSIS — R202 Paresthesia of skin: Secondary | ICD-10-CM | POA: Diagnosis not present

## 2018-06-16 DIAGNOSIS — G549 Nerve root and plexus disorder, unspecified: Secondary | ICD-10-CM

## 2018-06-16 NOTE — Progress Notes (Signed)
Please refer to EMG and nerve conduction procedure note.  

## 2018-06-16 NOTE — Telephone Encounter (Signed)
Dr. Jannifer Franklin has ordered NeoSenory Neuropathy Paraneoplastic Profile with [employer] through [facility].   Order form requires patients signature. I reached out to Jeanette Boone and advised of this and she was agreeable with coming by the office tomorrow morning and signing the form. Once signed form is received I will contact [employer] and determine next steps.

## 2018-06-16 NOTE — Procedures (Signed)
     HISTORY:  Jeanette Boone is a 58 year old patient with a history of breast cancer.  The patient has diffuse sensory abnormalities that were initially felt to be a paraneoplastic syndrome, the patient began having symptoms several months prior to the diagnosis of breast cancer.  She has been stable for a number of years but recently has started having progression of sensory symptoms once again.  Since last seen, she has developed numbness of the tongue and of the teeth.  She is being evaluated for possible sensory neuropathy.  NERVE CONDUCTION STUDIES:  Nerve conduction studies were performed on the right upper extremity. The distal motor latencies and motor amplitudes for the median and ulnar nerves were within normal limits. The nerve conduction velocities for these nerves were also normal. The sensory latencies for the median and ulnar nerves were normal. The F wave latency for the ulnar nerve was within normal limits.  Nerve conduction studies were performed on both lower extremities. The distal motor latencies and motor amplitudes for the peroneal and posterior tibial nerves were within normal limits. The nerve conduction velocities for these nerves were also normal. The sensory latencies for the peroneal and sural nerves were within normal limits. The F wave latencies for the posterior tibial nerves were within normal limits.   EMG STUDIES:  EMG study was performed on the right lower extremity:  The tibialis anterior muscle reveals 2 to 4K motor units with full recruitment. No fibrillations or positive waves were seen. The peroneus tertius muscle reveals 2 to 4K motor units with full recruitment. No fibrillations or positive waves were seen. The medial gastrocnemius muscle reveals 1 to 3K motor units with full recruitment. No fibrillations or positive waves were seen. The vastus lateralis muscle reveals 2 to 4K motor units with full recruitment. No fibrillations or positive waves were  seen. The iliopsoas muscle reveals 2 to 4K motor units with full recruitment. No fibrillations or positive waves were seen. The biceps femoris muscle (long head) reveals 2 to 4K motor units with full recruitment. No fibrillations or positive waves were seen. The lumbosacral paraspinal muscles were tested at 3 levels, and revealed no abnormalities of insertional activity at all 3 levels tested. There was good relaxation.   IMPRESSION:  Nerve conduction studies done on the right upper extremity and both lower extremities were relatively unremarkable.  No clear evidence of a peripheral neuropathy is seen.  This study is similar to what was seen previously in 2014.  EMG evaluation of the right lower extremity was unremarkable, no evidence of an overlying lumbosacral radiculopathy was seen.  This study does not completely exclude a possible low-grade neuronopathy associated with a paraneoplastic syndrome.  Jill Alexanders MD 06/16/2018 10:47 AM  [facility] [address] [place], [place] [address]  Phone [phone] Fax [phone]

## 2018-06-16 NOTE — Telephone Encounter (Signed)
[employer] order sent to [facility]. They obtain the auth and will reach out to the patient to schedule.

## 2018-06-16 NOTE — Progress Notes (Addendum)
The patient comes in today for EMG nerve conduction study evaluation.  Nerve conduction studies are normal, EMG of the right leg was normal.  The patient is still have a mild sensory neuronopathy.  She now complains of tingling sensations in the tongue and teeth, this is new for her.  We will check MRI of the brain with and without gadolinium.  We will pursue paraneoplastic antibody testing.     [place]    Nerve / Sites Muscle Latency Ref. Amplitude Ref. Rel Amp Segments Distance Velocity Ref. Area    ms ms mV mV %  cm m/s m/s mVms  R Median - APB     Wrist APB 3.4 ?4.4 7.6 ?4.0 100 Wrist - APB 7   30.2     Upper arm APB 7.3  7.0  91.6 Upper arm - Wrist 21 54 ?49 27.5  R Ulnar - ADM     Wrist ADM 2.2 ?3.3 7.8 ?6.0 100 Wrist - ADM 7   31.1     B.Elbow ADM 5.5  7.8  100 B.Elbow - Wrist 19 58 ?49 29.3     A.Elbow ADM 7.3  7.9  101 A.Elbow - B.Elbow 10 55 ?49 30.2         A.Elbow - Wrist      L Peroneal - EDB     Ankle EDB 4.2 ?6.5 5.9 ?2.0 100 Ankle - EDB 9   20.5     Fib head EDB 10.1  5.2  89.1 Fib head - Ankle 29 49 ?44 19.8     Pop fossa EDB 12.2  5.0  96 Pop fossa - Fib head 10 48 ?44 19.5         Pop fossa - Ankle      R Peroneal - EDB     Ankle EDB 4.6 ?6.5 4.5 ?2.0 100 Ankle - EDB 9   17.7     Fib head EDB 10.7  3.9  87.2 Fib head - Ankle 29 48 ?44 16.9     Pop fossa EDB 12.8  3.8  98.4 Pop fossa - Fib head 10 47 ?44 16.9         Pop fossa - Ankle      L Tibial - AH     Ankle AH 3.2 ?5.8 10.1 ?4.0 100 Ankle - AH 9   22.3     Pop fossa AH 12.1  5.5  54.3 Pop fossa - Ankle 36 41 ?41 16.7  R Tibial - AH     Ankle AH 3.5 ?5.8 10.7 ?4.0 100 Ankle - AH 9   27.4     Pop fossa AH 12.6  7.8  72.2 Pop fossa - Ankle 40 44 ?41 22.1                  SNC    Nerve / Sites Rec. Site Peak Lat Ref.  Amp Ref. Segments Distance Peak Diff Ref.    ms ms V V  cm ms ms  L Sural - Ankle (Calf)     Calf Ankle 3.3 ?4.4 6 ?6 Calf - Ankle 14    R Sural - Ankle (Calf)     Calf Ankle 3.4 ?4.4 6 ?6  Calf - Ankle 14    L Superficial peroneal - Ankle     Lat leg Ankle 4.0 ?4.4 4 ?6 Lat leg - Ankle 14    R Superficial peroneal - Ankle     Lat leg Ankle 4.0 ?  4.4 4 ?6 Lat leg - Ankle 14    R Median, Ulnar - Transcarpal comparison     Median Palm Wrist 2.6 ?2.2 46 ?35 Median Palm - Wrist 8       Ulnar Palm Wrist 1.9 ?2.2 12 ?12 Ulnar Palm - Wrist 8          Median Palm - Ulnar Palm  0.7 ?0.4  R Median - Orthodromic (Dig II, Mid palm)     Dig II Wrist 3.6 ?3.4 14 ?10 Dig II - Wrist 13    R Ulnar - Orthodromic, (Dig V, Mid palm)     Dig V Wrist 2.5 ?3.1 7 ?5 Dig V - Wrist 71                     F  Wave    Nerve F Lat Ref.   ms ms  L Tibial - AH 45.8 ?56.0  R Ulnar - ADM 27.4 ?32.0  R Tibial - AH 52.3 ?56.0

## 2018-06-17 NOTE — Telephone Encounter (Signed)
Pt came by the office today and signed the insurance investigation form.  I reached out to [place] and spoke with Learta Codding to review the process for this blood test.  She states once the investigation form is received the pt would receive the blood drawing kit and either a phlebotomist would be sent to the pt's house or pt would be scheduled for an appt with [facility]. Liza confirmed Chesley Noon is responsible for scheduling this appt.   I have completed the paper work and faxed to [employer] Press [employer]) and received confirmation.  I contacted the pt next and left a vm asking her to call me back so I could review this process.

## 2018-06-18 NOTE — Telephone Encounter (Signed)
Pt returned my call and I was able to update her on the status of the request and the process.  Pt verbalized understanding and had no questions.

## 2018-06-21 ENCOUNTER — Other Ambulatory Visit: Payer: Self-pay | Admitting: Oncology

## 2018-07-13 ENCOUNTER — Ambulatory Visit
Admission: RE | Admit: 2018-07-13 | Discharge: 2018-07-13 | Disposition: A | Discharge status: Home / Self Care | Payer: Managed Care, Other (non HMO) | Source: Ambulatory Visit | Attending: Neurology | Admitting: Neurology

## 2018-07-13 ENCOUNTER — Telehealth: Payer: Self-pay | Admitting: Neurology

## 2018-07-13 ENCOUNTER — Other Ambulatory Visit: Payer: Self-pay

## 2018-07-13 DIAGNOSIS — R202 Paresthesia of skin: Secondary | ICD-10-CM | POA: Diagnosis not present

## 2018-07-13 MED ORDER — GADOBENATE DIMEGLUMINE 529 MG/ML IV SOLN
17.0000 mL | Freq: Once | INTRAVENOUS | Status: AC | PRN
  Administered 2018-07-13: 17 mL via INTRAVENOUS

## 2018-07-13 NOTE — Telephone Encounter (Signed)
I spoke with her about the MRI brain --- it shows some foci most likely chronic microvascular ischemic change.   Unlikely related to her numbness.

## 2018-07-14 ENCOUNTER — Telehealth: Payer: Self-pay | Admitting: *Deleted

## 2018-07-14 ENCOUNTER — Telehealth: Payer: Self-pay | Admitting: Oncology

## 2018-07-14 NOTE — Telephone Encounter (Signed)
Scheduled appt per 7/7 sch message. - pt is aware of appt date and time

## 2018-07-14 NOTE — Telephone Encounter (Signed)
LOS sent to reschedule appointment to MD ASAP and may use an 830 am appointment.

## 2018-07-14 NOTE — Telephone Encounter (Signed)
Christella Scheuermann Auth: [id number] (exp. 07/08/18 to 01/04/19) patient had MRI on 07/13/18 at [facility].

## 2018-07-14 NOTE — Telephone Encounter (Signed)
"  Jeanette Boone [phone]).  Need help with message left with scheduling to change my annual appointment on 07-30-2018 to be seen by Dr. Jana Hakim not nurse practitioner.  Do not want appointment moved out three months but first available.  Neurology symptoms increased the last six months.  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    My neurologist has performed several tests but found nothing.    Previously experienced neurologic problems as a warning sign or precursor of my cancer.    Do not need to be seen today but need to see him with his first available.    Return call to my mobile number [phone]."

## 2018-07-15 NOTE — Progress Notes (Signed)
ID: Jeanette Boone   DOB: November 02, 1960  MR#: [id number]  TKP#:[id number]  Patient Care Team: Carollee Herter, Alferd Apa, DO as PCP - General Kathrynn Ducking, MD as Consulting Physician (Neurology) Jari Pigg, MD as Consulting Physician (Dermatology) Clarene Essex, MD as Consulting Physician (Gastroenterology) , Virgie Dad, MD as Consulting Physician (Oncology) Princess Bruins, MD as Consulting Physician (Obstetrics and Gynecology) OTHER MD:  CHIEF COMPLAINT: HER-2 positive breast cancer  CURRENT TREATMENT: Observation   INTERVAL HISTORY:  Jeanette Boone returns today for follow-up of her estrogen receptor negative breast cancer. She continues under observation. 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 She presented with increased tingling and burning sensations on 04/29/2018. She reports numbness to her feet (mainly her right), and burning to her toes, tingling to her lips, change in her hearing, and sore upper arms (right more than left).She notes this began in December 2018. She underwent nerve conduction studies on 06/16/2018, with relatively unremarkable results and no clear evidence of a peripheral neuropathy seen.  She also underwent brain MRI on 07/13/2018, with results showing: nonspecific scattered T2/flair hyperintense foci in the subcortical and deep white matter most consistent with chronic microvascular ischemic change or the sequela of migraine headaches, the number of foci has progressed compared to 2010 MRI; no acute findings; normal enhancement pattern.  Following her last visit, she underwent bilateral breast MRI on 08/12/2017 to follow up on her dense breasts. This showed breast density category C and no evidence of malignancy.  She then underwent routine screening bilateral mammogram on 03/20/2018. This showed breast density category C  and no evidence of malignancy.   REVIEW OF SYSTEMS:  Jeanette Boone states concern over her recent neurological symptoms. 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 She walks and bikes for exercise 6 days a week. She notes she has been working to lose weight but cutting calories. Her kids (adopted) are now 100 and 42. A detailed review of systems was otherwise stable.   BREAST CANCER HISTORY:  From the original intake note:  She herself palpated a mass in her right breast in 04/2008.  The patient tells me that she had lost approximately 100 pounds by diet and exercise over the past year, and that this made her breasts lumpier feeling.  In any case, she brought this question to Dr. Truddie Hidden Lavoie's attention, and was set up for mammography at [place] on 04/21/2008.  She had had a mammogram in 09/2007, which had been unremarkable.  The mammogram on 05/01/2008 showed a dense right breast with a suggestion of a possible mass in the upper central breast corresponding to the palpable abnormality.  Ultrasound was more revealing, and did show an irregularly bordered hypoechoic solid mass measuring 1.2 cm.  The patient was brought back for biopsy and clip placement on 04/23, and the pathology from that procedure ([id number] and [id number]) showed an invasive ductal carcinoma which was negative for the estrogen and progesterone receptor at 0 and 0 respectively.  The Ki-67 was 19%, and CISH was amplified with a ratio of 2.22 (this is just above the "indeterminate" range).  Bilateral breast MRIs were obtained on 04/30.  This showed in the right breast an enhancing rounded mass measuring up to 1.8 cm.  The left breast was unremarkable, and there were no enlarged or suspicious internal mammary or  axillary nodes on either side.  With this information, the patient proceeded to definitive right lumpectomy and sentinel lymph node sampling on 05/16/2008.  The final pathology ([id number])  confirmed a 1.5 cm invasive ductal carcinoma, grade 2, with negative margins, no evidence of lymphovascular invasion, and 0 of 3 sentinel lymph nodes involved.  The estrogen receptor test was repeated ([id number]), and was again negative with a strong positive control.  Her subsequent history is as detailed below.    PAST MEDICAL HISTORY: Past Medical History:  Diagnosis Date  . Cancer (HCC)    breast  . Chronic leg pain   . Endometriosis   . Family history of bladder cancer   . Family history of breast cancer   . Leg cramps 11/19/2016  . Obesity   . Paresthesias    Generalized  . 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 Thrombocytopenia, secondary    secondary to valtrex  She had an episode of cholecystitis, and underwent cholecystectomy on 06/01/2008 under Dr. Dalbert Batman.  The pathology there ([id number]) showed only chronic cholecystitis and cholelithiasis with a benign cystic duct lymph node. History of cold sores, and takes chronic suppressive therapy with Valtrex.      PAST SURGICAL HISTORY: Past Surgical History:  Procedure Laterality Date  . BREAST SURGERY  05/2008   lumpectomy - right  . CHOLECYSTECTOMY  05/2008  . COLONOSCOPY  08/2009  . HERNIA REPAIR  6389   umbilical  . HERNIA REPAIR  1996  . LAPAROSCOPY    . port a cath insertion  06/2008  . uterine polyp removal  06/2008    FAMILY HISTORY Family History  Problem Relation Age of Onset  . Heart disease Father        pacemaker----tachycardia  . Stroke Maternal Grandmother   . Bladder Cancer Maternal Grandmother        dx in her 76s  . Heart disease Maternal Grandfather 52       MI  . Stroke Maternal Grandfather   . Heart disease Paternal Grandfather 4       MI  . Hypertension Mother   . Breast cancer Other        MGF's sister   The patient's father died at the age of 73 from causes not quite clear to the patient, but he certainly had significant heart disease.  The patient's mother is alive.  She has a history of  bladder cancer.  Both the patient's parents were single children.  The patient has one sister and two brothers.  There is no history of breast cancer in the immediate family.   GYNECOLOGIC HISTORY: Patient's last menstrual period was 05/13/2010. She is GX, P0. The patient tried for many years to get pregnant and took "large doses of fertility drugs" for about 8 or 9 years before giving up on those attempts.   SOCIAL HISTORY: (updated 07/16/2018)  Evett works as a Music therapist for [employer].  Her husband, [name], is an Chief Financial Officer.  Their two children, [name] 17 and Jeanice Lim, were adopted from [place].  The patient attends the [employer].    ADVANCED DIRECTIVES Not in place    HEALTH MAINTENANCE: Social History   Tobacco Use  . Smoking status: Former Research scientist (life sciences)  . Smokeless tobacco: Never Used  Substance Use Topics  . Alcohol use: Not Currently    Comment: 0     Colonoscopy: 2011  PAP: UTD/ Lavoie  Bone density:  Lipid panel:   Allergies  Allergen Reactions  .  Clarithromycin     Thrush  . Penicillins Other (See Comments)    Child hood     Current Outpatient Medications on File Prior to Visit  Medication Sig Dispense Refill  . B Complex Vitamins (B-COMPLEX/B-12 PO) Take by mouth.    . Biotin 1000 MCG tablet Take 1,000 mcg by mouth daily.     . cholecalciferol (VITAMIN D) 1000 UNITS tablet Take 2,000 Units by mouth daily.    . DULoxetine (CYMBALTA) 30 MG capsule TAKE ONE CAPSULE BY MOUTH EVERY DAY 90 capsule 3  . gabapentin (NEURONTIN) 300 MG capsule TAKE 3 CAPSULES BY MOUTH FOUR TIMES DAILY 1080 capsule 3  . Multiple Vitamin (MULTIVITAMIN) tablet Take 1 tablet by mouth daily.    . valACYclovir (VALTREX) 500 MG tablet TAKE 1 TABLET(500 MG) BY MOUTH DAILY 90 tablet 3   No current facility-administered medications on file prior to visit.     OBJECTIVE: Middle-aged white woman who appears stated age  58:   07/16/18 0817  BP: 106/65   Pulse: (!) 57  Resp: 18  Temp: 97.7 F (36.5 C)  TempSrc: Oral  SpO2: 99%  Weight: 181 lb 11.2 oz (82.4 kg)  Height: _0  (1.702 m)   Sclerae unicteric, EOMs intact Wearing a mask No cervical or supraclavicular adenopathy Lungs no rales or rhonchi Heart regular rate and rhythm Abd soft, nontender, positive bowel sounds MSK no focal spinal tenderness, no upper extremity lymphedema, but limited range of motion right upper extremity Neuro: nonfocal, well oriented, appropriate affect Breasts: The right breast is status post lumpectomy and radiation with no evidence of disease recurrence.  The left breast is benign.  Both axillae are benign.  Breasts: The right breast is status post lumpectomy followed by radiation.  There is no evidence of local recurrence.  The left breast is benign.  Both axillae are benign.  LAB RESULTS: CBC    Component Value Date/Time   WBC 4.3 07/16/2018 0800   RBC 4.46 07/16/2018 0800   HGB 14.0 07/16/2018 0800   HGB 12.6 05/30/2016 1230   HCT 42.8 07/16/2018 0800   HCT 37.8 05/30/2016 1230   PLT 174 07/16/2018 0800   PLT 140 (L) 05/30/2016 1230   MCV 96.0 07/16/2018 0800   MCV 101.8 (H) 05/30/2016 1230   MCH 31.4 07/16/2018 0800   MCHC 32.7 07/16/2018 0800   RDW 12.1 07/16/2018 0800   RDW 12.8 05/30/2016 1230   LYMPHSABS 1.9 07/16/2018 0800   LYMPHSABS 1.3 05/30/2016 1230   MONOABS 0.3 07/16/2018 0800   MONOABS 0.2 05/30/2016 1230   EOSABS 0.1 07/16/2018 0800   EOSABS 0.1 05/30/2016 1230   BASOSABS 0.0 07/16/2018 0800   BASOSABS 0.0 05/30/2016 1230    CMP Latest Ref Rng & Units 07/16/2018 05/14/2018 02/23/2018  Glucose 70 - 99 mg/dL 97 - 87  BUN 6 - 20 mg/dL 12 - 13  Creatinine 0.44 - 1.00 mg/dL 0.91 - 0.89  Sodium 135 - 145 mmol/L 142 - 140  Potassium 3.5 - 5.1 mmol/L 4.5 - 5.4(H)  Chloride 98 - 111 mmol/L 106 - 103  CO2 22 - 32 mmol/L 27 - 31  Calcium 8.9 - 10.3 mg/dL 9.4 - 9.7  Total Protein 6.5 - 8.1 g/dL 7.2 6.4 6.7  Total Bilirubin  0.3 - 1.2 mg/dL 0.4 - 0.5  Alkaline Phos 38 - 126 U/L 146(H) - 127(H)  AST 15 - 41 U/L 26 - 22  ALT 0 - 44 U/L 24 - 20  STUDIES: Mr Jeri Cos FV Contrast  Result Date: 07/13/2018  [facility] [address], [place], [place] [address] [phone] NEUROIMAGING REPORT STUDY DATE: 07/13/2018 PATIENT NAME: Monai Hindes DOB: 1960/09/01 MRN: [id number] EXAM: MRI Brain with and without contrast ORDERING CLINICIAN: Kathrynn Ducking, MD CLINICAL HISTORY: 58 year old woman with paresthesias COMPARISON FILMS: 05/26/2008 TECHNIQUE:MRI of the brain with and without contrast was obtained utilizing 5 mm axial slices with T1, T2, T2 flair, SWI and diffusion weighted views.  T1 sagittal, T2 coronal and postcontrast views in the axial and coronal plane were obtained. CONTRAST: 17 ml [employer] IMAGING SITE: [facility], [place]. FINDINGS: On sagittal images, the spinal cord is imaged caudally to C4 and is normal in caliber.   The contents of the posterior fossa are of normal size and position.   The pituitary gland and optic chiasm appear normal.    Brain volume appears normal.   The ventricles are normal in size and without distortion.  There are no abnormal extra-axial collections of fluid.  The cerebellum and brainstem appears normal.   The deep gray matter appears normal.  In the hemispheres, there are scattered T2/flair hyperintense foci predominantly in the subcortical and deep white matter.  None of these appear to be acute and they do not enhance.  However, there has been progression and only a couple of the foci were present on the MRI from 2010.   Diffusion weighted images are normal.  Susceptibility weighted images are normal.   The orbits appear normal.   The VIIth/VIIIth nerve complex appears normal.  The mastoid air cells appear normal.  The paranasal sinuses appear normal.  Flow voids are identified within the major intracerebral arteries.  After the infusion of contrast  material, a normal enhancement pattern is noted.   This MRI of the brain with and without contrast shows the following: 1.   Scattered T2/flair hyperintense foci in the subcortical and deep white matter.  This is a nonspecific finding but is most consistent with chronic microvascular ischemic change or the sequela of migraine headaches.  These are less consistent with demyelination.  The number of foci has progressed compared to the 2010 MRI. 2.   There are no acute findings and there is a normal enhancement pattern. INTERPRETING PHYSICIAN: Richard A. Felecia Shelling, MD, PhD, FAAN Certified in  Neuroimaging by [employer]    ASSESSMENT: 58 y.o.  BRCA negative [place] woman  (1) status post right upper inner quadrant lumpectomy and sentinel lymph node dissection May 2010 for a T1c N0, stage 1A invasive ductal carcinoma,  grade 2,  estrogen and progesterone receptor negative, but HER-2/neu positive.  The MIB-1 was 19%.   (2) received 4 cycles of docetaxel/carboplatin/trastuzumab adjuvantly  (3) she continued Herceptin to July 2010 with echocardiogram showing a well-preserved ejection fraction after the completion of treatment.  (4) she completed radiation in December 2010.   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 Sequencing was performed for select regions of POLE and POLD1, and large rearrangement analysis was performed for select regions of GREM1.   (7) persistent mild transaminase elevation, with negative hepatitis and HIV serologies and no evidence of  metastases by CT scan of the chest 02/28/2016  (a) hepatic  steatosis noted on abdominal MRI/MRCP 10/21/2016  (8) iron deficiency, status post Feraheme 03/08/2016  (9) category D breast density  (a) breast MRI 08/12/2017 shows no evidence of disease  (b) mammography with tomography 03/20/2018 unremarkable   PLAN:  Mikael is now 10 years out from definitive surgery for her breast cancer with no evidence of disease recurrence.  This is very favorable.  She has been having worsening of her poorly understood neuropathy.  Dr. Jannifer Franklin has been working this up with a working diagnosis of para neoplasia, but of course this is notoriously difficult to nail down.  Her main concern from a cancer point of view is that when she first developed this 10 years ago with an acute exacerbation of this problem she had cancer.  She is very concerned right now that she may have cancer somewhere, not necessarily breast since we did do an MRI of the breast and recent mammogram and they did not show evidence of cancer there.  It is very difficult to prove a negative but I think the best test for her is a PET scan and I will try to obtain that for her I do hope and expect that this will be negative.  In that case at least she has the reassurance that if there is cancer somewhere we are not able to find it and therefore he would have to be microscopic.  She has a good understanding of this plan.  Assuming the test does not reveal anything obvious she will return to see me April of next year after her next mammogram  She knows to call for any other issue that may develop before that visit.  , Virgie Dad, MD  07/16/18 8:52 AM Medical Oncology and Hematology [facility] [address] [place], [place] [address] Tel. [phone]    Fax. [phone]   I, Wilburn Mylar, am acting as scribe for Dr. Virgie Dad. .  I, Lurline Del MD, have reviewed the above documentation for accuracy and completeness, and I agree with the above.

## 2018-07-16 ENCOUNTER — Inpatient Hospital Stay: Payer: Managed Care, Other (non HMO)

## 2018-07-16 ENCOUNTER — Other Ambulatory Visit: Payer: Self-pay

## 2018-07-16 ENCOUNTER — Inpatient Hospital Stay: Payer: Managed Care, Other (non HMO) | Attending: Oncology | Admitting: Oncology

## 2018-07-16 VITALS — BP 106/65 | HR 57 | Temp 97.7°F | Resp 18 | Ht 67.0 in | Wt 181.7 lb

## 2018-07-16 DIAGNOSIS — C50211 Malignant neoplasm of upper-inner quadrant of right female breast: Secondary | ICD-10-CM

## 2018-07-16 DIAGNOSIS — Z853 Personal history of malignant neoplasm of breast: Secondary | ICD-10-CM | POA: Insufficient documentation

## 2018-07-16 DIAGNOSIS — G0489 Other myelitis: Secondary | ICD-10-CM | POA: Insufficient documentation

## 2018-07-16 DIAGNOSIS — D499 Neoplasm of unspecified behavior of unspecified site: Secondary | ICD-10-CM | POA: Insufficient documentation

## 2018-07-16 LAB — COMPREHENSIVE METABOLIC PANEL
ALT: 24 U/L (ref 0–44)
AST: 26 U/L (ref 15–41)
Albumin: 4.1 g/dL (ref 3.5–5.0)
Alkaline Phosphatase: 146 U/L — ABNORMAL HIGH (ref 38–126)
Anion gap: 9 (ref 5–15)
BUN: 12 mg/dL (ref 6–20)
CO2: 27 mmol/L (ref 22–32)
Calcium: 9.4 mg/dL (ref 8.9–10.3)
Chloride: 106 mmol/L (ref 98–111)
Creatinine, Ser: 0.91 mg/dL (ref 0.44–1.00)
GFR calc Af Amer: >60 mL/min (ref >60)
GFR calc non Af Amer: >60 mL/min (ref >60)
Glucose, Bld: 97 mg/dL (ref 70–99)
Potassium: 4.5 mmol/L (ref 3.5–5.1)
Sodium: 142 mmol/L (ref 135–145)
Total Bilirubin: 0.4 mg/dL (ref 0.3–1.2)
Total Protein: 7.2 g/dL (ref 6.5–8.1)

## 2018-07-16 LAB — CBC WITH DIFFERENTIAL/PLATELET
Abs Immature Granulocytes: 0.01 10*3/uL (ref 0.00–0.07)
Basophils Absolute: 0 10*3/uL (ref 0.0–0.1)
Basophils Relative: 1 %
Eosinophils Absolute: 0.1 10*3/uL (ref 0.0–0.5)
Eosinophils Relative: 3 %
HCT: 42.8 % (ref 36.0–46.0)
Hemoglobin: 14 g/dL (ref 12.0–15.0)
Immature Granulocytes: 0 %
Lymphocytes Relative: 43 %
Lymphs Abs: 1.9 10*3/uL (ref 0.7–4.0)
MCH: 31.4 pg (ref 26.0–34.0)
MCHC: 32.7 g/dL (ref 30.0–36.0)
MCV: 96 fL (ref 80.0–100.0)
Monocytes Absolute: 0.3 10*3/uL (ref 0.1–1.0)
Monocytes Relative: 8 %
Neutro Abs: 1.9 10*3/uL (ref 1.7–7.7)
Neutrophils Relative %: 45 %
Platelets: 174 10*3/uL (ref 150–400)
RBC: 4.46 MIL/uL (ref 3.87–5.11)
RDW: 12.1 % (ref 11.5–15.5)
WBC: 4.3 10*3/uL (ref 4.0–10.5)
nRBC: 0 % (ref 0.0–0.2)

## 2018-07-17 ENCOUNTER — Telehealth: Payer: Self-pay | Admitting: Oncology

## 2018-07-17 NOTE — Telephone Encounter (Signed)
I left a message regarding schedule  

## 2018-07-20 ENCOUNTER — Other Ambulatory Visit: Payer: Self-pay

## 2018-07-21 ENCOUNTER — Encounter: Payer: Self-pay | Admitting: Obstetrics & Gynecology

## 2018-07-21 ENCOUNTER — Ambulatory Visit (INDEPENDENT_AMBULATORY_CARE_PROVIDER_SITE_OTHER): Payer: Managed Care, Other (non HMO) | Admitting: Obstetrics & Gynecology

## 2018-07-21 VITALS — BP 124/80 | Ht 67.25 in | Wt 181.0 lb

## 2018-07-21 DIAGNOSIS — B0089 Other herpesviral infection: Secondary | ICD-10-CM

## 2018-07-21 DIAGNOSIS — E663 Overweight: Secondary | ICD-10-CM

## 2018-07-21 DIAGNOSIS — Z780 Asymptomatic menopausal state: Secondary | ICD-10-CM | POA: Diagnosis not present

## 2018-07-21 DIAGNOSIS — C50211 Malignant neoplasm of upper-inner quadrant of right female breast: Secondary | ICD-10-CM | POA: Diagnosis not present

## 2018-07-21 DIAGNOSIS — C801 Malignant (primary) neoplasm, unspecified: Secondary | ICD-10-CM

## 2018-07-21 DIAGNOSIS — Z01419 Encounter for gynecological examination (general) (routine) without abnormal findings: Secondary | ICD-10-CM

## 2018-07-21 DIAGNOSIS — G988 Other disorders of nervous system: Secondary | ICD-10-CM | POA: Diagnosis not present

## 2018-07-21 MED ORDER — VALACYCLOVIR HCL 500 MG PO TABS: 500.0000 mg | ORAL_TABLET | Freq: Every day | ORAL | 4 refills | Status: DC

## 2018-07-21 NOTE — Progress Notes (Signed)
Jeanette Boone [birth year] [id number]   History:    58 y.o. G1P0A1 Married  RP:  Established patient presenting for annual gyn exam   HPI:  Menopausal.  No HRT.  No PMB.  H/O Rt breast 1.5 cm invasive ductal carcinoma, grade 2, with negative margins, no evidence of lymphovascular invasion, and 0 of 3 sentinel lymph nodes involved, estrogen receptor negative in 05/2008, f/u Dr Jana Hakim.  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  Recent increase in neurologic symptoms throughout her body but especially around the face.  For that reason, she had a brain MRI recently and is contemplating doing a PET scan.  Mild increase in Liver Enzymes, Fatty liver, f/u Hepatic specialist.  No pelvic pain.    Last pelvic US neg 06/2015.   BMI 28.14.   On Valacyclovir for oral HSV.  Past medical history,surgical history, family history and social history were all reviewed and documented in the EPIC chart.  Gynecologic History Patient's last menstrual period was 05/13/2010. Contraception: post menopausal status Last Pap: 07/2016. Results were: Negative Last mammogram: 03/2018. Results were: Negative Bone Density: 09/2016 Normal Colonoscopy: 2011  Obstetric History OB History  Gravida Para Term Preterm AB Living  1 0     1 0  SAB TAB Ectopic Multiple Live Births  1            # Outcome Date GA Lbr Len/2nd Weight Sex Delivery Anes PTL Lv  1 SAB              ROS: A ROS was performed and pertinent positives and negatives are included in the history.  GENERAL: No fevers or chills. HEENT: No change in vision, no earache, sore throat or sinus congestion. NECK: No pain or stiffness. CARDIOVASCULAR: No chest pain or pressure. No palpitations. PULMONARY: No shortness of breath, cough or wheeze. GASTROINTESTINAL: No abdominal pain, nausea, vomiting or diarrhea, melena or bright red blood per rectum. GENITOURINARY: No urinary frequency, urgency, hesitancy or dysuria. MUSCULOSKELETAL: No joint  or muscle pain, no back pain, no recent trauma. DERMATOLOGIC: No rash, no itching, no lesions. ENDOCRINE: No polyuria, polydipsia, no heat or cold intolerance. No recent change in weight. HEMATOLOGICAL: No anemia or easy bruising or bleeding. NEUROLOGIC: No headache, seizures, numbness, tingling or weakness. PSYCHIATRIC: No depression, no loss of interest in normal activity or change in sleep pattern.     Exam:   BP 124/80   Ht 5' 7.25" (1.708 m)   Wt 181 lb (82.1 kg)   LMP 05/13/2010   BMI 28.14 kg/m   Body mass index is 28.14 kg/m.  General appearance : Well developed well nourished female. No acute distress HEENT: Eyes: no retinal hemorrhage or exudates,  Neck supple, trachea midline, no carotid bruits, no thyroidmegaly Lungs: Clear to auscultation, no rhonchi or wheezes, or rib retractions  Heart: Regular rate and rhythm, no murmurs or gallops Breast:Examined in sitting and supine position were symmetrical in appearance, no palpable masses or tenderness,  no skin retraction, no nipple inversion, no nipple discharge, no skin discoloration, no axillary or supraclavicular lymphadenopathy Abdomen: no palpable masses or tenderness, no rebound or guarding Extremities: no edema or skin discoloration or tenderness  Pelvic: Vulva: Normal             Vagina: No gross lesions or discharge  Cervix: No gross lesions or discharge.  Pap reflex done.  Uterus  AV, normal size, shape and consistency, non-tender and mobile  Adnexa  Without masses or tenderness  Anus: Normal   Assessment/Plan:  58 y.o. female for annual exam   1. Encounter for routine gynecological examination with Papanicolaou smear of cervix Normal gynecologic exam.  Pap reflex done.  Breast exam normal.  Screening mammogram March 2020 was negative.  Health labs with family physician.  2. Menopause present Well on no hormone replacement therapy.  No postmenopausal bleeding.  Vitamin D supplements, calcium intake of 1200 mg  daily and regular weightbearing physical activities.  3. Malignant neoplasm of upper-inner quadrant of right female breast, unspecified estrogen receptor status ([place]) Right breast adenocarcinoma, estrogen receptor negative.  Possibly a paraneoplastic neurologic disorder with recent increase in neurologic symptoms.  Decision to proceed with a pelvic ultrasound to rule out ovarian pathology.  Ca125 done today. 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  Considering a PET scan. - US Transvaginal Non-OB; Future - CA 125  5. Recurrent oral herpes simplex infection Valacyclovir 500 mg/tab. 1 tablet per mouth daily represcribed as prophylaxis.  6. Overweight (BMI 25.0-29.9) Recommend a slightly lower calorie/carb diet such as [employer].  Regular physical activities with aerobic activities 5 times a week and weightlifting every 2 days.  Other orders - valACYclovir (VALTREX) 500 MG tablet; Take 1 tablet (500 mg total) by mouth daily.  Princess Bruins MD, 2:23 PM 07/21/2018

## 2018-07-21 NOTE — Patient Instructions (Signed)
1. Encounter for routine gynecological examination with Papanicolaou smear of cervix Normal gynecologic exam.  Pap reflex done.  Breast exam normal.  Screening mammogram March 2020 was negative.  Health labs with family physician.  2. Menopause present Well on no hormone replacement therapy.  No postmenopausal bleeding.  Vitamin D supplements, calcium intake of 1200 mg daily and regular weightbearing physical activities.  3. Malignant neoplasm of upper-inner quadrant of right female breast, unspecified estrogen receptor status ([place]) Right breast adenocarcinoma, estrogen receptor negative.  Possibly a paraneoplastic neurologic disorder with recent increase in neurologic symptoms.  Decision to proceed with a pelvic ultrasound to rule out ovarian pathology.  Ca125 done today. 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  Considering a PET scan. - US Transvaginal Non-OB; Future - CA 125  5. Recurrent oral herpes simplex infection Valacyclovir 500 mg/tab. 1 tablet per mouth daily represcribed as prophylaxis.  6. Overweight (BMI 25.0-29.9) Recommend a slightly lower calorie/carb diet such as [employer].  Regular physical activities with aerobic activities 5 times a week and weightlifting every 2 days.  Other orders - valACYclovir (VALTREX) 500 MG tablet; Take 1 tablet (500 mg total) by mouth daily.  Jeanette Boone, it was a pleasure seeing you today!  I will inform you of your results as soon as they are available.

## 2018-07-22 ENCOUNTER — Telehealth: Payer: Self-pay | Admitting: Oncology

## 2018-07-22 LAB — CA 125: CA 125: 5 U/mL (ref <35)

## 2018-07-22 NOTE — Telephone Encounter (Signed)
GM PAL 7/23 f/u moved to [facility] per GM. Spoke with patient and per patient cx appointments for 7/20 and 7/23 due to she was seen 7/9, with next f/u being April 2021.

## 2018-07-22 NOTE — Addendum Note (Signed)
Addended by: Thurnell Garbe A on: 07/22/2018 03:25 PM   Modules accepted: Orders

## 2018-07-23 ENCOUNTER — Ambulatory Visit (HOSPITAL_COMMUNITY): Payer: Managed Care, Other (non HMO)

## 2018-07-23 LAB — PAP IG W/ RFLX HPV ASCU

## 2018-07-27 ENCOUNTER — Other Ambulatory Visit: Payer: Managed Care, Other (non HMO)

## 2018-07-30 ENCOUNTER — Ambulatory Visit: Payer: Managed Care, Other (non HMO) | Admitting: Adult Health

## 2018-08-19 ENCOUNTER — Telehealth: Payer: Self-pay | Admitting: Neurology

## 2018-08-19 ENCOUNTER — Other Ambulatory Visit: Payer: Self-pay

## 2018-08-19 NOTE — Telephone Encounter (Signed)
I called the patient.  MRI of the brain and blood work through [facility] labs does not show any significant abnormalities, the paraneoplastic antibody panel from [place] labs was negative.  Etiology of her sensory complaints is still not clear.  We have not been able to determine a definite relationship with her breast cancer.  EMG nerve conduction study did not show an actual neuropathy.   MRI brain 07/13/18:  IMPRESSION: This MRI of the brain with and without contrast shows the following: 1.   Scattered T2/flair hyperintense foci in the subcortical and deep white matter.  This is a nonspecific finding but is most consistent with chronic microvascular ischemic change or the sequela of migraine headaches.  These are less consistent with demyelination.  The number of foci has progressed compared to the 2010 MRI. 2.   There are no acute findings and there is a normal enhancement pattern.

## 2018-08-20 ENCOUNTER — Ambulatory Visit (INDEPENDENT_AMBULATORY_CARE_PROVIDER_SITE_OTHER): Payer: Managed Care, Other (non HMO)

## 2018-08-20 ENCOUNTER — Encounter: Payer: Self-pay | Admitting: Obstetrics & Gynecology

## 2018-08-20 ENCOUNTER — Encounter: Payer: Managed Care, Other (non HMO) | Admitting: Family Medicine

## 2018-08-20 ENCOUNTER — Ambulatory Visit (INDEPENDENT_AMBULATORY_CARE_PROVIDER_SITE_OTHER): Payer: Managed Care, Other (non HMO) | Admitting: Obstetrics & Gynecology

## 2018-08-20 DIAGNOSIS — C801 Malignant (primary) neoplasm, unspecified: Secondary | ICD-10-CM

## 2018-08-20 DIAGNOSIS — Z853 Personal history of malignant neoplasm of breast: Secondary | ICD-10-CM

## 2018-08-20 DIAGNOSIS — C50211 Malignant neoplasm of upper-inner quadrant of right female breast: Secondary | ICD-10-CM

## 2018-08-20 DIAGNOSIS — G988 Other disorders of nervous system: Secondary | ICD-10-CM | POA: Diagnosis not present

## 2018-08-20 NOTE — Patient Instructions (Signed)
1. Malignant neoplasm of upper-inner quadrant of right female breast, unspecified estrogen receptor status ([place]) Pelvic ultrasound findings reviewed with patient.  Ovaries atrophic with no cysts or mass.  No adnexal mass.  No free fluid in the posterior cutis sac.  Patient reassured.  2. Paraneoplastic neurologic disorder [facility]) Resolved neurologic symptoms since last visit.  Katharine Look, good seeing you today!

## 2018-08-20 NOTE — Progress Notes (Signed)
    Jeanette Boone Sep 27, 1960 [id number]        58 y.o.  G1P0010 Married  RP:  Paraneoplastic neurologic Syndrome and Breast Cancer for Pelvic US  HPI: Neurologic symptoms resolved since last visit.  No pelvic pain.  No postmenopausal bleeding.   OB History  Gravida Para Term Preterm AB Living  1 0     1 0  SAB TAB Ectopic Multiple Live Births  1            # Outcome Date GA Lbr Len/2nd Weight Sex Delivery Anes PTL Lv  1 SAB             Past medical history,surgical history, problem list, medications, allergies, family history and social history were all reviewed and documented in the EPIC chart.   Directed ROS with pertinent positives and negatives documented in the history of present illness/assessment and plan.  Exam:  There were no vitals filed for this visit. General appearance:  Normal  Pelvic US today: T/V images.  Retroverted uterus measuring 6.52 x 5.06 x 4.51 cm.  2 anterior intramural fibroids measured at 2.8 x 2.4 and 1.8 x 1.7 cm.  Thin symmetrical endometrial lining measured at 2.0 mm.  Both ovaries small with atrophic appearance.  No adnexal mass.  No free fluid in the posterior to the sac.   Assessment/Plan:  58 y.o. G1P0010   1. Malignant neoplasm of upper-inner quadrant of right female breast, unspecified estrogen receptor status ([place]) Pelvic ultrasound findings reviewed with patient.  Ovaries atrophic with no cysts or mass.  No adnexal mass.  No free fluid in the posterior cutis sac.  Patient reassured.  2. Paraneoplastic neurologic disorder [facility]) Resolved neurologic symptoms since last visit.  Counseling on above issues and coordination of care more than 50% for 15 minutes.  Jeanette Bruins MD, 8:44 AM 08/20/2018

## 2018-09-07 ENCOUNTER — Ambulatory Visit (HOSPITAL_COMMUNITY): Payer: Managed Care, Other (non HMO)

## 2018-10-06 ENCOUNTER — Encounter: Payer: Self-pay | Admitting: Gynecology

## 2018-10-15 ENCOUNTER — Ambulatory Visit (INDEPENDENT_AMBULATORY_CARE_PROVIDER_SITE_OTHER): Payer: Managed Care, Other (non HMO) | Admitting: Neurology

## 2018-10-15 ENCOUNTER — Other Ambulatory Visit: Payer: Self-pay

## 2018-10-15 ENCOUNTER — Encounter: Payer: Self-pay | Admitting: Neurology

## 2018-10-15 VITALS — BP 107/73 | HR 56 | Temp 97.6°F | Ht 67.0 in | Wt 163.0 lb

## 2018-10-15 DIAGNOSIS — G549 Nerve root and plexus disorder, unspecified: Secondary | ICD-10-CM

## 2018-10-15 NOTE — Progress Notes (Signed)
Reason for visit: Sensory alteration  Jeanette Boone is an 58 y.o. female  History of present illness:  Jeanette Boone is a 58 year old right-handed white female with a history of breast cancer, she also has diffuse sensory alterations that have been present for quite a number of years, that began within a few months before her initial diagnosis of breast cancer.  The patient has had worsening of her sensory symptoms lately, she is concerned about recurrence of her breast cancer.  She has had a PET scan ordered but she claims that her insurance company has not approved it.  She recently had onset of mechanical pain involving the right shoulder, she is undergoing physical therapy and injections for frozen shoulder, and she is getting benefit with this.  She is on low-dose Cymbalta and she takes gabapentin, she does not wish to alter her therapy at this point.  She denies any balance issues or falls.  She returns for an evaluation.  Past Medical History:  Diagnosis Date  . Cancer (HCC)    breast  . Chronic leg pain   . Endometriosis   . Family history of bladder cancer   . Family history of breast cancer   . Leg cramps 11/19/2016  . Obesity   . Paresthesias    Generalized  . 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 Thrombocytopenia, secondary    secondary to valtrex    Past Surgical History:  Procedure Laterality Date  . BREAST SURGERY  05/2008   lumpectomy - right  . CHOLECYSTECTOMY  05/2008  . COLONOSCOPY  08/2009  . HERNIA REPAIR  [id number]   umbilical  . HERNIA REPAIR  1996  . LAPAROSCOPY    . port a cath insertion  06/2008  . uterine polyp removal  06/2008    Family History  Problem Relation Age of Onset  . Heart disease Father        pacemaker----tachycardia  . Stroke Maternal Grandmother   . Bladder Cancer Maternal Grandmother        dx in her 70s  . Heart disease Maternal Grandfather 74       MI  . Stroke Maternal Grandfather   . Heart disease Paternal Grandfather 55       MI  .  Hypertension Mother   . Breast cancer Other        MGF's sister    Social history:  reports that she has quit smoking. She has never used smokeless tobacco. She reports previous alcohol use. She reports that she does not use drugs.    Allergies  Allergen Reactions  . Clarithromycin     Thrush  . Penicillins Other (See Comments)    Child hood     Medications:  Prior to Admission medications   Medication Sig Start Date End Date Taking? Authorizing Provider  B Complex Vitamins (B-COMPLEX/B-12 PO) Take by mouth.   Yes [provider]  Biotin 1000 MCG tablet Take 1,000 mcg by mouth daily.    Yes [provider]  cholecalciferol (VITAMIN D) 1000 UNITS tablet Take 2,000 Units by mouth daily.   Yes [provider]  DULoxetine (CYMBALTA) 30 MG capsule TAKE ONE CAPSULE BY MOUTH EVERY DAY 02/17/18  Yes Kathrynn Ducking, MD  gabapentin (NEURONTIN) 300 MG capsule TAKE 3 CAPSULES BY MOUTH FOUR TIMES DAILY 02/12/18  Yes Kathrynn Ducking, MD  Multiple Vitamin (MULTIVITAMIN) tablet Take 1 tablet by mouth daily.   Yes [provider]  valACYclovir (VALTREX) 500 MG tablet  Take 1 tablet (500 mg total) by mouth daily. 07/21/18  Yes Princess Bruins, MD    ROS:  Out of a complete 14 system review of symptoms, the patient complains only of the following symptoms, and all other reviewed systems are negative.  Numbness, paresthesias  Blood pressure 107/73, pulse (!) 56, temperature 97.6 F (36.4 C), temperature source Temporal, height 5\' 7"  (1.702 m), weight 163 lb (73.9 kg), last menstrual period 05/13/2010.  Physical Exam  General: The patient is alert and cooperative at the time of the examination.  Skin: No significant peripheral edema is noted.   Neurologic Exam  Mental status: The patient is alert and oriented x 3 at the time of the examination. The patient has apparent normal recent and remote memory, with an apparently normal attention span and  concentration ability.   Cranial nerves: Facial symmetry is present. Speech is normal, no aphasia or dysarthria is noted. Extraocular movements are full. Visual fields are full.  Motor: The patient has good strength in all 4 extremities.  Sensory examination: Soft touch sensation is symmetric on the face, arms, and legs.  Coordination: The patient has good finger-nose-finger and heel-to-shin bilaterally.  Gait and station: The patient has a normal gait. Tandem gait is slightly unsteady. Romberg is negative. No drift is seen.  Reflexes: Deep tendon reflexes are symmetric.   MRI brain 07/13/18:  IMPRESSION: This MRI of the brain with and without contrast shows the following: 1. Scattered T2/flair hyperintense foci in the subcortical and deep white matter. This is a nonspecific finding but is most consistent with chronic microvascular ischemic change or the sequela of migraine headaches. These are less consistent with demyelination. The number of foci has progressed compared to the 2010 MRI. 2. There are no acute findings and there is a normal enhancement pattern.  * MRI scan images were reviewed online. I agree with the written report.    Assessment/Plan:  1.  History of breast cancer  2.  Diffuse sensory alterations, etiology unclear  The patient has had repeat testing for paraneoplastic antibodies, these were negative, MRI of the brain shows mild nonspecific white matter changes, no evidence of MS.  The work-up has not determined a definite relationship between her sensory symptoms and her breast cancer.  The patient may undergo a PET scan to fully exclude early cancer recurrence.  The patient will contact me if she does wish to go up on her Cymbalta dosing.  She will otherwise follow-up in 6 months.   Greater than 50% of the visit was spent in counseling and coordination of care.  Face-to-face time with the patient was 25 minutes.   Jill Alexanders MD 10/15/2018 8:02 AM   [facility] [address] [place], [place] [address]  Phone [phone] Fax [phone]

## 2018-10-29 ENCOUNTER — Other Ambulatory Visit: Payer: Self-pay

## 2018-10-29 ENCOUNTER — Encounter: Payer: Self-pay | Admitting: Family Medicine

## 2018-10-29 ENCOUNTER — Ambulatory Visit (INDEPENDENT_AMBULATORY_CARE_PROVIDER_SITE_OTHER): Payer: Managed Care, Other (non HMO) | Admitting: Family Medicine

## 2018-10-29 VITALS — BP 100/70 | HR 50 | Temp 97.3°F | Resp 18 | Ht 67.5 in | Wt 162.8 lb

## 2018-10-29 DIAGNOSIS — Z0000 Encounter for general adult medical examination without abnormal findings: Secondary | ICD-10-CM | POA: Diagnosis not present

## 2018-10-29 DIAGNOSIS — Z23 Encounter for immunization: Secondary | ICD-10-CM

## 2018-10-29 DIAGNOSIS — Z8249 Family history of ischemic heart disease and other diseases of the circulatory system: Secondary | ICD-10-CM

## 2018-10-29 NOTE — Patient Instructions (Signed)
Preventive Care 40-57 Years Old, Female Preventive care refers to visits with your health care provider and lifestyle choices that can promote health and wellness. This includes:  A yearly physical exam. This may also be called an annual well check.  Regular dental visits and eye exams.  Immunizations.  Screening for certain conditions.  Healthy lifestyle choices, such as eating a healthy diet, getting regular exercise, not using drugs or products that contain nicotine and tobacco, and limiting alcohol use. What can I expect for my preventive care visit? Physical exam Your health care provider will check your:  Height and weight. This may be used to calculate body mass index (BMI), which tells if you are at a healthy weight.  Heart rate and blood pressure.  Skin for abnormal spots. Counseling Your health care provider may ask you questions about your:  Alcohol, tobacco, and drug use.  Emotional well-being.  Home and relationship well-being.  Sexual activity.  Eating habits.  Work and work environment.  Method of birth control.  Menstrual cycle.  Pregnancy history. What immunizations do I need?  Influenza (flu) vaccine  This is recommended every year. Tetanus, diphtheria, and pertussis (Tdap) vaccine  You may need a Td booster every 10 years. Varicella (chickenpox) vaccine  You may need this if you have not been vaccinated. Zoster (shingles) vaccine  You may need this after age 57. Measles, mumps, and rubella (MMR) vaccine  You may need at least one dose of MMR if you were born in 1957 or later. You may also need a second dose. Pneumococcal conjugate (PCV13) vaccine  You may need this if you have certain conditions and were not previously vaccinated. Pneumococcal polysaccharide (PPSV23) vaccine  You may need one or two doses if you smoke cigarettes or if you have certain conditions. Meningococcal conjugate (MenACWY) vaccine  You may need this if you  have certain conditions. Hepatitis A vaccine  You may need this if you have certain conditions or if you travel or work in places where you may be exposed to hepatitis A. Hepatitis B vaccine  You may need this if you have certain conditions or if you travel or work in places where you may be exposed to hepatitis B. Haemophilus influenzae type b (Hib) vaccine  You may need this if you have certain conditions. Human papillomavirus (HPV) vaccine  If recommended by your health care provider, you may need three doses over 6 months. You may receive vaccines as individual doses or as more than one vaccine together in one shot (combination vaccines). Talk with your health care provider about the risks and benefits of combination vaccines. What tests do I need? Blood tests  Lipid and cholesterol levels. These may be checked every 5 years, or more frequently if you are over 57 years old.  Hepatitis C test.  Hepatitis B test. Screening  Lung cancer screening. You may have this screening every year starting at age 57 if you have a 30-pack-year history of smoking and currently smoke or have quit within the past 15 years.  Colorectal cancer screening. All adults should have this screening starting at age 57 and continuing until age 75. Your health care provider may recommend screening at age 45 if you are at increased risk. You will have tests every 1-10 years, depending on your results and the type of screening test.  Diabetes screening. This is done by checking your blood sugar (glucose) after you have not eaten for a while (fasting). You may have this   done every 1-3 years.  Mammogram. This may be done every 1-2 years. Talk with your health care provider about when you should start having regular mammograms. This may depend on whether you have a family history of breast cancer.  BRCA-related cancer screening. This may be done if you have a family history of breast, ovarian, tubal, or peritoneal  cancers.  Pelvic exam and Pap test. This may be done every 3 years starting at age 57. Starting at age 57, this may be done every 5 years if you have a Pap test in combination with an HPV test. Other tests  Sexually transmitted disease (STD) testing.  Bone density scan. This is done to screen for osteoporosis. You may have this scan if you are at high risk for osteoporosis. Follow these instructions at home: Eating and drinking  Eat a diet that includes fresh fruits and vegetables, whole grains, lean protein, and low-fat dairy.  Take vitamin and mineral supplements as recommended by your health care provider.  Do not drink alcohol if: ? Your health care provider tells you not to drink. ? You are pregnant, may be pregnant, or are planning to become pregnant.  If you drink alcohol: ? Limit how much you have to 0-1 drink a day. ? Be aware of how much alcohol is in your drink. In the [place] one drink equals one 12 oz bottle of beer (355 mL), one 5 oz glass of wine (148 mL), or one 1 oz glass of hard liquor (44 mL). Lifestyle  Take daily care of your teeth and gums.  Stay active. Exercise for at least 30 minutes on 5 or more days each week.  Do not use any products that contain nicotine or tobacco, such as cigarettes, e-cigarettes, and chewing tobacco. If you need help quitting, ask your health care provider.  If you are sexually active, practice safe sex. Use a condom or other form of birth control (contraception) in order to prevent pregnancy and STIs (sexually transmitted infections).  If told by your health care provider, take low-dose aspirin daily starting at age 57. What's next?  Visit your health care provider once a year for a well check visit.  Ask your health care provider how often you should have your eyes and teeth checked.  Stay up to date on all vaccines. This information is not intended to replace advice given to you by your health care provider. Make sure you  discuss any questions you have with your health care provider. Document Released: 01/20/2015 Document Revised: 09/04/2017 Document Reviewed: 09/04/2017 Elsevier Patient Education  2020 Elsevier Inc.  

## 2018-10-29 NOTE — Progress Notes (Signed)
ubjective:     Jeanette Boone is a 58 y.o. female and is here for a comprehensive physical exam. The patient reports no problems.  Social History   Socioeconomic History  . Marital status: Married    Spouse name: Jeanette Boone  . Number of children: 0  . Years of education: college  . Highest education level: Not on file  Occupational History  . Occupation: Pharmacist, hospital.    Employer: [employer]  . Financial resource strain: Not on file  . Food insecurity    Worry: Not on file    Inability: Not on file  . Transportation needs    Medical: Not on file    Non-medical: Not on file  Tobacco Use  . Smoking status: Former Research scientist (life sciences)  . Smokeless tobacco: Never Used  Substance and Sexual Activity  . Alcohol use: Not Currently    Comment: 0  . Drug use: Never  . Sexual activity: Yes    Partners: Male    Comment: refused to answer insurance questions  Lifestyle  . Physical activity    Days per week: Not on file    Minutes per session: Not on file  . Stress: Not on file  Relationships  . Social Herbalist on phone: Not on file    Gets together: Not on file    Attends religious service: Not on file    Active member of club or organization: Not on file    Attends meetings of clubs or organizations: Not on file    Relationship status: Not on file  . Intimate partner violence    Fear of current or ex partner: Not on file    Emotionally abused: Not on file    Physically abused: Not on file    Forced sexual activity: Not on file  Other Topics Concern  . Not on file  Social History Narrative   Exercise-- yes      Patient is right handed.   Patient does not drink caffeine.   Health Maintenance  Topic Date Due  . TETANUS/TDAP  02/02/2018  . MAMMOGRAM  03/20/2019  . COLONOSCOPY  09/08/2019  . PAP SMEAR-Modifier  07/21/2021  . INFLUENZA VACCINE  Completed  . Hepatitis C Screening  Completed  . HIV Screening  Completed    The following portions  of the patient's history were reviewed and updated as appropriate: She  has a past medical history of Cancer [facility]), Chronic leg pain, Endometriosis, Family history of bladder cancer, Family history of breast cancer, Leg cramps (11/19/2016), Obesity, Paresthesias, Sensory neuronopathy (11/02/2012), and Thrombocytopenia, secondary. She does not have any pertinent problems on file. She  has a past surgical history that includes Hernia repair (1995); Hernia repair (1996); Cholecystectomy (05/2008); uterine polyp removal (06/2008); Colonoscopy (08/2009); port a cath insertion (06/2008); Breast surgery (05/2008); and laparoscopy. Her family history includes Bladder Cancer in her maternal grandmother; Breast cancer in an other family member; Heart disease in her father; Heart disease (age of onset: 71) in her maternal grandfather; Heart disease (age of onset: 52) in her paternal grandfather; Hypertension in her mother; Stroke in her maternal grandfather and maternal grandmother. She  reports that she has quit smoking. She has never used smokeless tobacco. She reports previous alcohol use. She reports that she does not use drugs. She has a current medication list which includes the following prescription(s): b complex vitamins, biotin, cholecalciferol, duloxetine, gabapentin, multivitamin, and valacyclovir. Current Outpatient Medications on File Prior  to Visit  Medication Sig Dispense Refill  . B Complex Vitamins (B-COMPLEX/B-12 PO) Take by mouth.    . Biotin 1000 MCG tablet Take 1,000 mcg by mouth daily.     . cholecalciferol (VITAMIN D) 1000 UNITS tablet Take 2,000 Units by mouth daily.    . DULoxetine (CYMBALTA) 30 MG capsule TAKE ONE CAPSULE BY MOUTH EVERY DAY 90 capsule 3  . gabapentin (NEURONTIN) 300 MG capsule TAKE 3 CAPSULES BY MOUTH FOUR TIMES DAILY 1080 capsule 3  . Multiple Vitamin (MULTIVITAMIN) tablet Take 1 tablet by mouth daily.    . valACYclovir (VALTREX) 500 MG tablet Take 1 tablet (500 mg total)  by mouth daily. 90 tablet 4   No current facility-administered medications on file prior to visit.    She is allergic to clarithromycin; other; and penicillins..  Review of Systems Review of Systems  Constitutional: Negative for activity change, appetite change and fatigue.  HENT: Negative for hearing loss, congestion, tinnitus and ear discharge.  dentist q88m Eyes: Negative for visual disturbance (see optho q1y -- vision corrected to 20/20 with glasses).  Respiratory: Negative for cough, chest tightness and shortness of breath.   Cardiovascular: Negative for chest pain, palpitations and leg swelling.  Gastrointestinal: Negative for abdominal pain, diarrhea, constipation and abdominal distention.  Genitourinary: Negative for urgency, frequency, decreased urine volume and difficulty urinating.  Musculoskeletal: Negative for back pain, arthralgias and gait problem.  Skin: Negative for color change, pallor and rash.  Neurological: Negative for dizziness, light-headedness, numbness and headaches.  Hematological: Negative for adenopathy. Does not bruise/bleed easily.  Psychiatric/Behavioral: Negative for suicidal ideas, confusion, sleep disturbance, self-injury, dysphoric mood, decreased concentration and agitation.       Objective:    BP 100/70 (BP Location: Right Arm, Patient Position: Sitting, Cuff Size: Normal)   Pulse (!) 50   Temp (!) 97.3 F (36.3 C) (Temporal)   Resp 18   Ht 5' 7.5" (1.715 m)   Wt 162 lb 12.8 oz (73.8 kg)   LMP 05/13/2010   SpO2 99%   BMI 25.12 kg/m  General appearance: alert, cooperative, appears stated age and no distress Head: Normocephalic, without obvious abnormality, atraumatic Eyes: conjunctivae/corneas clear. PERRL, EOM's intact. Fundi benign. Ears: normal TM's and external ear canals both ears Nose: Nares normal. Septum midline. Mucosa normal. No drainage or sinus tenderness. Throat: lips, mucosa, and tongue normal; teeth and gums normal Neck:  no adenopathy, no carotid bruit, no JVD, supple, symmetrical, trachea midline and thyroid not enlarged, symmetric, no tenderness/mass/nodules Back: symmetric, no curvature. ROM normal. No CVA tenderness. Lungs: clear to auscultation bilaterally Breasts: gyn Heart: regular rate and rhythm, S1, S2 normal, no murmur, click, rub or gallop Abdomen: soft, non-tender; bowel sounds normal; no masses,  no organomegaly Pelvic: deferred -gyn Extremities: extremities normal, atraumatic, no cyanosis or edema Pulses: 2+ and symmetric Skin: Skin color, texture, turgor normal. No rashes or lesions Lymph nodes: Cervical, supraclavicular, and axillary nodes normal. Neurologic: Alert and oriented X 3, normal strength and tone. Normal symmetric reflexes. Normal coordination and gait    Assessment:    Healthy female exam.      Plan:    ghm utd Check labs  See After Visit Summary for Counseling Recommendations    1. Need for influenza vaccination   - Flu Vaccine QUAD 6+ mos PF IM (Fluarix Quad PF)  2. Need for vaccination   - Tdap vaccine greater than or equal to 7yo IM  3. Preventative health care See above  - Lipid  panel - CBC with Differential/Platelet - TSH - Comprehensive metabolic panel  4. Family history of heart disease ekg  Sinus brady- EKG 12-Lead

## 2018-10-30 DIAGNOSIS — Z23 Encounter for immunization: Secondary | ICD-10-CM | POA: Diagnosis not present

## 2018-10-30 LAB — CBC WITH DIFFERENTIAL/PLATELET
Basophils Absolute: 0 10*3/uL (ref 0.0–0.1)
Basophils Relative: 0.5 % (ref 0.0–3.0)
Eosinophils Absolute: 0.2 10*3/uL (ref 0.0–0.7)
Eosinophils Relative: 4.6 % (ref 0.0–5.0)
HCT: 39.5 % (ref 36.0–46.0)
Hemoglobin: 13.3 g/dL (ref 12.0–15.0)
Lymphocytes Relative: 39.7 % (ref 12.0–46.0)
Lymphs Abs: 1.6 10*3/uL (ref 0.7–4.0)
MCHC: 33.6 g/dL (ref 30.0–36.0)
MCV: 98.7 fl (ref 78.0–100.0)
Monocytes Absolute: 0.2 10*3/uL (ref 0.1–1.0)
Monocytes Relative: 5.9 % (ref 3.0–12.0)
Neutro Abs: 1.9 10*3/uL (ref 1.4–7.7)
Neutrophils Relative %: 49.3 % (ref 43.0–77.0)
Platelets: 147 10*3/uL — ABNORMAL LOW (ref 150.0–400.0)
RBC: 4.01 Mil/uL (ref 3.87–5.11)
RDW: 13 % (ref 11.5–15.5)
WBC: 4 10*3/uL (ref 4.0–10.5)

## 2018-10-30 LAB — COMPREHENSIVE METABOLIC PANEL
ALT: 22 U/L (ref 0–35)
AST: 22 U/L (ref 0–37)
Albumin: 4.2 g/dL (ref 3.5–5.2)
Alkaline Phosphatase: 126 U/L — ABNORMAL HIGH (ref 39–117)
BUN: 10 mg/dL (ref 6–23)
CO2: 31 mEq/L (ref 19–32)
Calcium: 9.5 mg/dL (ref 8.4–10.5)
Chloride: 103 mEq/L (ref 96–112)
Creatinine, Ser: 0.83 mg/dL (ref 0.40–1.20)
GFR: 70.73 mL/min (ref >60.00)
Glucose, Bld: 88 mg/dL (ref 70–99)
Potassium: 4 mEq/L (ref 3.5–5.1)
Sodium: 140 mEq/L (ref 135–145)
Total Bilirubin: 0.6 mg/dL (ref 0.2–1.2)
Total Protein: 6.5 g/dL (ref 6.0–8.3)

## 2018-10-30 LAB — LIPID PANEL
Cholesterol: 204 mg/dL — ABNORMAL HIGH (ref 0–200)
HDL: 58.1 mg/dL (ref >39.00)
LDL Cholesterol: 121 mg/dL — ABNORMAL HIGH (ref 0–99)
NonHDL: 145.47
Total CHOL/HDL Ratio: 4
Triglycerides: 120 mg/dL (ref 0.0–149.0)
VLDL: 24 mg/dL (ref 0.0–40.0)

## 2018-10-30 LAB — TSH: TSH: 1.96 u[IU]/mL (ref 0.35–4.50)

## 2018-11-16 ENCOUNTER — Other Ambulatory Visit: Payer: Self-pay | Admitting: Oncology

## 2018-11-16 DIAGNOSIS — K760 Fatty (change of) liver, not elsewhere classified: Secondary | ICD-10-CM

## 2018-11-16 DIAGNOSIS — G549 Nerve root and plexus disorder, unspecified: Secondary | ICD-10-CM

## 2018-11-16 DIAGNOSIS — C50211 Malignant neoplasm of upper-inner quadrant of right female breast: Secondary | ICD-10-CM

## 2018-11-16 DIAGNOSIS — D499 Neoplasm of unspecified behavior of unspecified site: Secondary | ICD-10-CM

## 2018-11-17 ENCOUNTER — Encounter (HOSPITAL_COMMUNITY): Payer: Managed Care, Other (non HMO)

## 2018-11-23 ENCOUNTER — Other Ambulatory Visit: Payer: Self-pay

## 2018-11-23 ENCOUNTER — Encounter (HOSPITAL_COMMUNITY): Payer: Self-pay

## 2018-11-23 ENCOUNTER — Encounter: Payer: Self-pay | Admitting: Oncology

## 2018-11-23 ENCOUNTER — Ambulatory Visit (HOSPITAL_COMMUNITY)
Admission: RE | Admit: 2018-11-23 | Discharge: 2018-11-23 | Disposition: A | Discharge status: Home / Self Care | Payer: Managed Care, Other (non HMO) | Source: Ambulatory Visit | Attending: Oncology | Admitting: Oncology

## 2018-11-23 DIAGNOSIS — G130 Paraneoplastic neuromyopathy and neuropathy: Secondary | ICD-10-CM | POA: Insufficient documentation

## 2018-11-23 DIAGNOSIS — G549 Nerve root and plexus disorder, unspecified: Secondary | ICD-10-CM | POA: Insufficient documentation

## 2018-11-23 DIAGNOSIS — D499 Neoplasm of unspecified behavior of unspecified site: Secondary | ICD-10-CM | POA: Insufficient documentation

## 2018-11-23 DIAGNOSIS — C50211 Malignant neoplasm of upper-inner quadrant of right female breast: Secondary | ICD-10-CM | POA: Insufficient documentation

## 2018-11-23 DIAGNOSIS — Z171 Estrogen receptor negative status [ER-]: Secondary | ICD-10-CM | POA: Diagnosis present

## 2018-11-23 DIAGNOSIS — K760 Fatty (change of) liver, not elsewhere classified: Secondary | ICD-10-CM | POA: Insufficient documentation

## 2018-11-23 MED ORDER — SODIUM CHLORIDE (PF) 0.9 % IJ SOLN
INTRAMUSCULAR | Status: AC
  Filled 2018-11-23: qty 50

## 2018-11-23 MED ORDER — IOHEXOL 300 MG/ML  SOLN
100.0000 mL | Freq: Once | INTRAMUSCULAR | Status: AC | PRN
  Administered 2018-11-23: 100 mL via INTRAVENOUS

## 2018-11-26 ENCOUNTER — Other Ambulatory Visit: Payer: Self-pay

## 2018-11-26 ENCOUNTER — Ambulatory Visit: Payer: Managed Care, Other (non HMO) | Admitting: Neurology

## 2018-11-26 DIAGNOSIS — Z20822 Contact with and (suspected) exposure to covid-19: Secondary | ICD-10-CM

## 2018-11-29 LAB — NOVEL CORONAVIRUS, NAA: SARS-CoV-2, NAA: NOT DETECTED

## 2018-11-30 ENCOUNTER — Ambulatory Visit (HOSPITAL_COMMUNITY): Payer: Managed Care, Other (non HMO)

## 2018-12-12 ENCOUNTER — Emergency Department (HOSPITAL_COMMUNITY): Payer: Managed Care, Other (non HMO)

## 2018-12-12 ENCOUNTER — Encounter (HOSPITAL_COMMUNITY): Payer: Self-pay | Admitting: Emergency Medicine

## 2018-12-12 ENCOUNTER — Other Ambulatory Visit: Payer: Self-pay

## 2018-12-12 DIAGNOSIS — R0789 Other chest pain: Secondary | ICD-10-CM | POA: Insufficient documentation

## 2018-12-12 DIAGNOSIS — R6884 Jaw pain: Secondary | ICD-10-CM | POA: Insufficient documentation

## 2018-12-12 DIAGNOSIS — Y9389 Activity, other specified: Secondary | ICD-10-CM | POA: Diagnosis not present

## 2018-12-12 DIAGNOSIS — Y999 Unspecified external cause status: Secondary | ICD-10-CM | POA: Insufficient documentation

## 2018-12-12 DIAGNOSIS — W228XXA Striking against or struck by other objects, initial encounter: Secondary | ICD-10-CM | POA: Diagnosis not present

## 2018-12-12 DIAGNOSIS — S0181XA Laceration without foreign body of other part of head, initial encounter: Secondary | ICD-10-CM | POA: Insufficient documentation

## 2018-12-12 DIAGNOSIS — Z87891 Personal history of nicotine dependence: Secondary | ICD-10-CM | POA: Diagnosis not present

## 2018-12-12 DIAGNOSIS — S0990XA Unspecified injury of head, initial encounter: Secondary | ICD-10-CM | POA: Diagnosis present

## 2018-12-12 DIAGNOSIS — E860 Dehydration: Secondary | ICD-10-CM | POA: Diagnosis not present

## 2018-12-12 DIAGNOSIS — R55 Syncope and collapse: Secondary | ICD-10-CM | POA: Diagnosis not present

## 2018-12-12 DIAGNOSIS — Z79899 Other long term (current) drug therapy: Secondary | ICD-10-CM | POA: Diagnosis not present

## 2018-12-12 DIAGNOSIS — Y92000 Kitchen of unspecified non-institutional (private) residence as  the place of occurrence of the external cause: Secondary | ICD-10-CM | POA: Insufficient documentation

## 2018-12-12 LAB — CBG MONITORING, ED: Glucose-Capillary: 110 mg/dL — ABNORMAL HIGH (ref 70–99)

## 2018-12-12 NOTE — ED Triage Notes (Addendum)
Patient here from home with complaints of LOC.  Reports laceration to chin. Bleeding controlled. No blood thinners. Reports rib pain, tongue and bottom tooth pain. Reports that this has been happening more in the past 6 months.

## 2018-12-12 NOTE — ED Provider Notes (Signed)
MSE was initiated and I personally evaluated the patient and placed orders (if any) at  8:46 PM on December 59, 3923.  58 year old female who presents for evaluation after syncopal episode, facial injury.  Patient reports that she often gets lightheaded, particularly when she gets up from seated position.  She reports that just prior to ED arrival, she was at home and stood up quickly and felt lightheaded.  He states that she tried to catch herself but ended up passing out, landing face forward.  She does report that she hit her head when she fell.  She is not on any blood thinners.  She reports a laceration noted to her chin.  She reports some soreness to her teeth but everything feels aligned.  She has not had any vision changes, nausea/vomiting, numbness/weakness.  Patient states she has some pain in her mid midsternal region but does not feel like she is having trouble breathing.  Her tetanus is up-to-date.  Patient denies any neck pain, numbness/weakness, difficulty breathing, vomiting.  3 cm linear laceration noted to chin.  Does not go through and through.  Elevation/depression of mandible intact with any difficulty.  No tenderness palpation of the mandible.  Teeth appear to be in alignment.  No midline C-spine tenderness..  Pupils are equal and reactive.  5/5 strength of bilateral upper and lower extremities.  Tenderness palpation noted to the midsternal region.  No deformities or crepitus noted.  No difficulty breathing.  The patient appears stable so that the remainder of the MSE may be completed by another provider.   Volanda Napoleon, PA-C 12/12/18 2048    Varney Biles, MD 12/12/18 2154

## 2018-12-13 ENCOUNTER — Emergency Department (HOSPITAL_COMMUNITY)
Admission: EM | Admit: 2018-12-13 | Discharge: 2018-12-13 | Disposition: A | Discharge status: Home / Self Care | Payer: Managed Care, Other (non HMO) | Attending: Emergency Medicine | Admitting: Emergency Medicine

## 2018-12-13 DIAGNOSIS — R0781 Pleurodynia: Secondary | ICD-10-CM

## 2018-12-13 DIAGNOSIS — R55 Syncope and collapse: Secondary | ICD-10-CM

## 2018-12-13 DIAGNOSIS — E860 Dehydration: Secondary | ICD-10-CM

## 2018-12-13 DIAGNOSIS — S0181XA Laceration without foreign body of other part of head, initial encounter: Secondary | ICD-10-CM

## 2018-12-13 DIAGNOSIS — R6884 Jaw pain: Secondary | ICD-10-CM

## 2018-12-13 LAB — CBC
HCT: 41.7 % (ref 36.0–46.0)
Hemoglobin: 13.5 g/dL (ref 12.0–15.0)
MCH: 32.1 pg (ref 26.0–34.0)
MCHC: 32.4 g/dL (ref 30.0–36.0)
MCV: 99.3 fL (ref 80.0–100.0)
Platelets: 179 10*3/uL (ref 150–400)
RBC: 4.2 MIL/uL (ref 3.87–5.11)
RDW: 11.9 % (ref 11.5–15.5)
WBC: 6.2 10*3/uL (ref 4.0–10.5)
nRBC: 0 % (ref 0.0–0.2)

## 2018-12-13 LAB — COMPREHENSIVE METABOLIC PANEL
ALT: 39 U/L (ref 0–44)
AST: 39 U/L (ref 15–41)
Albumin: 4.6 g/dL (ref 3.5–5.0)
Alkaline Phosphatase: 108 U/L (ref 38–126)
Anion gap: 11 (ref 5–15)
BUN: 9 mg/dL (ref 6–20)
CO2: 28 mmol/L (ref 22–32)
Calcium: 9.7 mg/dL (ref 8.9–10.3)
Chloride: 101 mmol/L (ref 98–111)
Creatinine, Ser: 0.81 mg/dL (ref 0.44–1.00)
GFR calc Af Amer: >60 mL/min (ref >60)
GFR calc non Af Amer: >60 mL/min (ref >60)
Glucose, Bld: 93 mg/dL (ref 70–99)
Potassium: 4.2 mmol/L (ref 3.5–5.1)
Sodium: 140 mmol/L (ref 135–145)
Total Bilirubin: 0.9 mg/dL (ref 0.3–1.2)
Total Protein: 7 g/dL (ref 6.5–8.1)

## 2018-12-13 LAB — MAGNESIUM: Magnesium: 2.3 mg/dL (ref 1.7–2.4)

## 2018-12-13 MED ORDER — SODIUM CHLORIDE 0.9 % IV BOLUS
1000.0000 mL | Freq: Once | INTRAVENOUS | Status: AC
  Administered 2018-12-13: 1000 mL via INTRAVENOUS

## 2018-12-13 MED ORDER — LIDOCAINE-EPINEPHRINE 2 %-1:100000 IJ SOLN
20.0000 mL | Freq: Once | INTRAMUSCULAR | Status: AC
  Administered 2018-12-13: 20 mL
  Filled 2018-12-13: qty 1

## 2018-12-13 NOTE — ED Provider Notes (Signed)
[facility] DEPT Provider Note   CSN: [id number] Arrival date & time: 12/12/18  2002     History   Chief Complaint Chief Complaint  Patient presents with  . Fall  . Loss of Consciousness  . Facial Laceration    HPI Jeanette Boone is a 58 y.o. 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  The patient reports that she was sitting on the couch and stood up and became very lightheaded.  She reports that she started to take a couple of steps across the room and became increasingly lightheaded before she had a syncopal event.  She reports that she woke up face down in her kitchen floor.  She has a laceration to her chin.  She is having some pain in her right ribs and thinks that she fell onto her right side.  She reports an associated headache.  She reports that she has been increasingly lightheaded with standing over the last few months.  She denies chest pain, shortness of breath, nausea, vomiting, diarrhea, numbness, weakness, slurred speech, or facial droop.  She reports that she has been actively trying to lose weight.  She is consuming approximately 1000 to 1200 cal a day.  Reports that she walks, bikes, or runs approximately 3 to 6 miles daily.  Reports that yesterday was the first day that she has ran in several months.  Denies any significant dietary changes or crash diets.      The history is provided by the patient. No language interpreter was used.    Past Medical History:  Diagnosis Date  . Cancer (HCC)    breast  . Chronic leg pain   . Endometriosis   . Family history of bladder cancer   . Family history of breast cancer   . Leg cramps 11/19/2016  . Obesity   . Paresthesias    Generalized  . 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 Thrombocytopenia, secondary    secondary to valtrex    Patient Active Problem List   Diagnosis Date Noted   . 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 Dense breast tissue on mammogram 07/29/2017  . Fatty liver 03/31/2017  . Leg cramps 11/19/2016  . Iron deficiency anemia 02/29/2016  . Genetic testing 01/25/2016  . Family history of breast cancer   . Family history of bladder cancer   . 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 Malignant neoplasm of upper-inner quadrant of right breast in female, estrogen receptor negative ([place]) 10/01/2012  . ANXIETY STATE, UNSPECIFIED 07/01/2008  . FIBROIDS, UTERUS 06/27/2008  . PELVIC  PAIN 06/27/2008  . 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  . HERPES LABIALIS 02/03/2008  . MUSCLE PAIN 02/03/2008    Past Surgical History:  Procedure Laterality Date  . BREAST SURGERY  05/2008   lumpectomy - right  . CHOLECYSTECTOMY  05/2008  . COLONOSCOPY  08/2009  . HERNIA REPAIR  [id number]   umbilical  . HERNIA REPAIR  1996  . LAPAROSCOPY    . port a cath insertion  06/2008  . uterine polyp removal  06/2008     OB History    Gravida  1   Para  0   Term      Preterm      AB  1   Living  0     SAB  1   TAB      Ectopic      Multiple  Live Births               Home Medications    Prior to Admission medications   Medication Sig Start Date End Date Taking? Authorizing Provider  B Complex Vitamins (B-COMPLEX/B-12 PO) Take 1 tablet by mouth daily.    Yes [provider]  Biotin 1000 MCG tablet Take 1,000 mcg by mouth daily.    Yes [provider]  cholecalciferol (VITAMIN D) 1000 UNITS tablet Take 2,000 Units by mouth daily.   Yes [provider]  DULoxetine (CYMBALTA) 30 MG capsule TAKE ONE CAPSULE BY MOUTH EVERY DAY Patient taking differently: Take 30 mg by mouth daily.  02/17/18  Yes Kathrynn Ducking, MD  gabapentin (NEURONTIN) 300 MG capsule TAKE 3 CAPSULES BY MOUTH FOUR TIMES DAILY Patient taking differently: Take 900 mg by mouth 4 (four) times daily.  02/12/18  Yes Kathrynn Ducking, MD  hydroxypropyl methylcellulose /  hypromellose (ISOPTO TEARS / GONIOVISC) 2.5 % ophthalmic solution Place 1 drop into both eyes 3 (three) times daily as needed for dry eyes.   Yes [provider]  Multiple Vitamin (MULTIVITAMIN) tablet Take 1 tablet by mouth daily.   Yes [provider]  valACYclovir (VALTREX) 500 MG tablet Take 1 tablet (500 mg total) by mouth daily. 07/21/18  Yes Princess Bruins, MD    Family History Family History  Problem Relation Age of Onset  . Heart disease Father        pacemaker----tachycardia  . Stroke Maternal Grandmother   . Bladder Cancer Maternal Grandmother        dx in her 50s  . Heart disease Maternal Grandfather 20       MI  . Stroke Maternal Grandfather   . Heart disease Paternal Grandfather 27       MI  . Hypertension Mother   . Breast cancer Other        MGF's sister    Social History Social History   Tobacco Use  . Smoking status: Former Research scientist (life sciences)  . Smokeless tobacco: Never Used  Substance Use Topics  . Alcohol use: Not Currently    Comment: 0  . Drug use: Never     Allergies   Clarithromycin, Other, and Penicillins   Review of Systems Review of Systems  Constitutional: Negative for activity change, chills, fever and unexpected weight change.  HENT: Negative for congestion and sore throat.   Respiratory: Negative for shortness of breath and wheezing.   Cardiovascular: Negative for chest pain.  Gastrointestinal: Negative for abdominal pain, diarrhea, nausea and vomiting.  Genitourinary: Negative for dysuria and urgency.  Musculoskeletal: Negative for back pain.  Skin: Positive for wound. Negative for color change and rash.  Allergic/Immunologic: Negative for immunocompromised state.  Neurological: Positive for syncope. Negative for seizures, weakness, numbness and headaches.  Psychiatric/Behavioral: Negative for confusion.     Physical Exam Updated Vital Signs BP 114/69   Pulse (!) 45   Temp 98.1 F (36.7 C) (Oral)   Resp 16   Ht 5'  7.5" (1.715 m)   Wt 68 kg   LMP 05/13/2010   SpO2 100%   BMI 23.15 kg/m   Physical Exam Vitals signs and nursing note reviewed.  Constitutional:      General: She is not in acute distress. HENT:     Head: Normocephalic. No raccoon eyes or Battle's sign.     Jaw: There is normal jaw occlusion. Tenderness present. No trismus, swelling, pain on movement or malocclusion.  Comments: There is a 2.5 cm superficial, hemostatic laceration noted to the submental region.  No obvious foreign bodies.    Mouth/Throat:     Mouth: Mucous membranes are dry.     Dentition: No dental tenderness.     Pharynx: Uvula midline.     Comments: Lips are dry and cracked. Eyes:     Conjunctiva/sclera: Conjunctivae normal.  Neck:     Musculoskeletal: Neck supple.  Cardiovascular:     Rate and Rhythm: Regular rhythm. Bradycardia present.     Pulses: Normal pulses.     Heart sounds: Normal heart sounds. No murmur. No friction rub. No gallop.   Pulmonary:     Effort: Pulmonary effort is normal. No respiratory distress.     Breath sounds: No stridor. No wheezing, rhonchi or rales.     Comments: Tender to palpation diffusely to the body of the sternum as well as some right anterolateral rib pain.  No crepitus or step-offs.  There is some overlying redness, but no bruising to tenderness in the right inferior ribs. Chest:     Chest wall: Tenderness present.  Abdominal:     General: There is no distension.     Palpations: Abdomen is soft. There is no mass.     Tenderness: There is no abdominal tenderness. There is no right CVA tenderness, left CVA tenderness, guarding or rebound.     Hernia: No hernia is present.     Comments: Abdomen is soft and nontender.  Skin:    General: Skin is warm.     Capillary Refill: Capillary refill takes 2 to 3 seconds.     Findings: No rash.  Neurological:     General: No focal deficit present.     Mental Status: She is alert and oriented to person, place, and time.   Psychiatric:        Behavior: Behavior normal.      ED Treatments / Results  Labs (all labs ordered are listed, but only abnormal results are displayed) Labs Reviewed  CBG MONITORING, ED - Abnormal; Notable for the following components:      Result Value   Glucose-Capillary 110 (*)    All other components within normal limits  CBC  COMPREHENSIVE METABOLIC PANEL  MAGNESIUM    EKG EKG Interpretation  Date/Time:  Saturday December 12 2018 20:37:08 EST Ventricular Rate:  54 PR Interval:    QRS Duration: 90 QT Interval:  433 QTC Calculation: 411 R Axis:   33 Text Interpretation: Sinus rhythm Low voltage, extremity and precordial leads Abnormal R-wave progression, early transition ST elevation, consider inferior injury No significant change since last tracing Confirmed by Varney Biles [id number]) on 12/12/2018 8:55:48 PM   Radiology Dg Chest 2 View  Result Date: 12/12/2018 CLINICAL DATA:  Fall. EXAM: CHEST - 2 VIEW COMPARISON:  September 18, 2015 FINDINGS: The heart size and mediastinal contours are within normal limits. Both lungs are clear. The visualized skeletal structures are unremarkable. IMPRESSION: No active cardiopulmonary disease. Electronically Signed   By: Dorise Bullion III M.D   On: 12/12/2018 22:04   Ct Head Wo Contrast  Result Date: 12/12/2018 CLINICAL DATA:  57 year old female with head trauma. EXAM: CT HEAD WITHOUT CONTRAST TECHNIQUE: Contiguous axial images were obtained from the base of the skull through the vertex without intravenous contrast. COMPARISON:  Brain MRI dated 07/13/2018. FINDINGS: Brain: Mild bifrontal atrophy. The ventricles and sulci are otherwise appropriate size for patient's age. The gray-white matter discrimination is preserved. There is no  acute intracranial hemorrhage. No mass effect or midline shift. No extra-axial fluid collection. Vascular: No hyperdense vessel or unexpected calcification. Skull: Normal. Negative for fracture or focal  lesion. Sinuses/Orbits: No acute finding. Other: None. IMPRESSION: 1. No acute intracranial pathology. 2. Mild bifrontal cortical atrophy. Electronically Signed   By: Anner Crete M.D.   On: 12/12/2018 22:39    Procedures .Marland KitchenLaceration Repair  Date/Time: 12/13/2018 9:27 AM Performed by: Joanne Gavel, PA-C Authorized by: Joanne Gavel, PA-C   Consent:    Consent obtained:  Verbal   Consent given by:  Patient   Risks discussed:  Pain, infection, need for additional repair, poor cosmetic result and poor wound healing   Alternatives discussed:  No treatment Anesthesia (see MAR for exact dosages):    Anesthesia method:  Local infiltration   Local anesthetic:  Lidocaine 2% WITH epi Laceration details:    Location: chin.   Length (cm):  2.5 Repair type:    Repair type:  Simple Pre-procedure details:    Preparation:  Patient was prepped and draped in usual sterile fashion Exploration:    Hemostasis achieved with:  Direct pressure   Wound exploration: wound explored through full range of motion and entire depth of wound probed and visualized     Wound extent: no fascia violation noted, no foreign bodies/material noted, no muscle damage noted, no nerve damage noted, no tendon damage noted, no underlying fracture noted and no vascular damage noted   Treatment:    Irrigation method:  Pressure wash   Visualized foreign bodies/material removed: no   Skin repair:    Repair method:  Sutures   Suture size:  5-0   Wound skin closure material used: Vicryl Rapide.   Suture technique:  Simple interrupted   Number of sutures:  5 Approximation:    Approximation:  Close Post-procedure details:    Dressing:  Open (no dressing)   Patient tolerance of procedure:  Tolerated well, no immediate complications   (including critical care time)  Medications Ordered in ED Medications  lidocaine-EPINEPHrine (XYLOCAINE W/EPI) 2 %-1:100000 (with pres) injection 20 mL (20 mLs Infiltration Given by  Other 12/13/18 0654)  sodium chloride 0.9 % bolus 1,000 mL (0 mLs Intravenous Stopped 12/13/18 0835)     Initial Impression / Assessment and Plan / ED Course  I have reviewed the triage vital signs and the nursing notes.  Pertinent labs & imaging results that were available during my care of the patient were reviewed by me and considered in my medical decision making (see chart for details).        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  She has a headache, but adamantly denies chest pain or shortness of breath.  Patient was discussed with Dr. Christy Gentles, attending physician.  Head CT with cortical atrophy, but no acute changes.  Chest x-ray is negative for fracture.  The patient was having focal tenderness over the right inferior ribs that could represent a rib fracture not seen on chest x-ray.  The patient was offered a chest CT, which she declined given her history of breast cancer and she did not want to increase radiation to the area. EKG unchanged from previous.  She is also having some pain in the jaw, but denies loose or missing teeth.  Declines CT maxillofacial at this time.  On exam, the patient appears clinically dehydrated with  dry mucous membranes and prolonged capillary refill.  She reports that she has been exercising by running, walking, or biking 3-6 miles daily and only consuming 1000 to 1200 cal to try lose weight.  Suspect orthostatic hypotension as the etiology of her syncope.  She does have bradycardia in the ER, but I do not suspect that she is symptomatic from this.  She has a laceration to the submental region that was repaired with 5 absorbable sutures.  She was neurologically intact to the area following the procedure.  Home wound care instructions were discussed.  We also discussed increasing her caloric intake if she continued to  exercise approximately 3 to 6 miles per day. ER return precautions given.  She was ambulated by ER staff and was asymptomatic prior to discharge.  Safe for discharge to home with outpatient follow-up as needed.  Final Clinical Impressions(s) / ED Diagnoses   Final diagnoses:  Dehydration  Syncope and collapse  Chin laceration, initial encounter  Rib pain on right side  Jaw pain    ED Discharge Orders    None       Joanne Gavel, PA-C 12/13/18 0931    Ripley Fraise, MD 12/13/18 2314

## 2018-12-13 NOTE — ED Notes (Signed)
Pt's wound on her chin irrigated with 200 mL NS and cleaned with non-toxic dermal cleanser.

## 2018-12-13 NOTE — Discharge Instructions (Addendum)
Thank you for allowing me to care for you today in the Emergency Department.   Make sure that you are drinking plenty of fluids  and have adequate calorie intake when you are exercising daily.  Your sutures will dissolve on their own within the next week.  If they do not dissolve within the next week, follow-up with primary care.  Keep the area clean and dry for the first 24 hours.  Then, you can clean the area gently with warm water and soap.  You can apply a topical antibiotic such as bacitracin or Neosporin to the area.  After the sutures have dissolved, make sure to wear widebrimmed hat, sunscreen over the area, or you can use intermittent to help with scar minimization for the next 6 to 9 months as needed.  Take tylenol for pain.   Return to the emergency department if you have another syncopal episode, lightheaded and dizziness with chest pain, severe shortness of breath, or other new, concerning symptoms.

## 2018-12-13 NOTE — ED Notes (Signed)
Pt ambulatory in hall without difficulty

## 2019-04-06 ENCOUNTER — Other Ambulatory Visit: Payer: Self-pay | Admitting: Family Medicine

## 2019-04-06 DIAGNOSIS — Z1231 Encounter for screening mammogram for malignant neoplasm of breast: Secondary | ICD-10-CM

## 2019-04-07 ENCOUNTER — Other Ambulatory Visit: Payer: Self-pay

## 2019-04-07 ENCOUNTER — Ambulatory Visit: Payer: Managed Care, Other (non HMO)

## 2019-04-07 ENCOUNTER — Ambulatory Visit
Admission: RE | Admit: 2019-04-07 | Discharge: 2019-04-07 | Disposition: A | Discharge status: Home / Self Care | Payer: Managed Care, Other (non HMO) | Source: Ambulatory Visit | Attending: Family Medicine | Admitting: Family Medicine

## 2019-04-07 DIAGNOSIS — Z1231 Encounter for screening mammogram for malignant neoplasm of breast: Secondary | ICD-10-CM

## 2019-04-07 HISTORY — DX: Malignant neoplasm of unspecified site of unspecified female breast: C50.919

## 2019-04-16 ENCOUNTER — Other Ambulatory Visit: Payer: Self-pay

## 2019-04-16 DIAGNOSIS — C50211 Malignant neoplasm of upper-inner quadrant of right female breast: Secondary | ICD-10-CM

## 2019-04-18 NOTE — Progress Notes (Signed)
ID: Jeanette Boone   DOB: 11-18-1960  MR#: [id number]  LSL#:[id number]  Patient Care Team: Carollee Herter, Alferd Apa, DO as PCP - General Kathrynn Ducking, MD as Consulting Physician (Neurology) Jari Pigg, MD as Consulting Physician (Dermatology) Clarene Essex, MD as Consulting Physician (Gastroenterology) Vidur Knust, Virgie Dad, MD as Consulting Physician (Oncology) Princess Bruins, MD as Consulting Physician (Obstetrics and Gynecology) OTHER MD:  CHIEF COMPLAINT: HER-2 positive breast cancer  CURRENT TREATMENT: Observation   INTERVAL HISTORY:  Jeanette Boone returns today for follow-up of her estrogen receptor negative breast cancer. She continues under observation.   Since her last visit, she underwent restaging CT chest, abdomen, pelvis on 11/23/2018, which showed no evidence of metastatic disease.  She underwent bilateral screening mammography with tomography at [facility] on 04/07/2019 showing: breast density category C; no evidence of malignancy in either breast.   Pelvic ultrasound on 08/20/2018 was normal.  She presented to the ED on 12/12/2018 after experiencing a fall. Chest x-ray and head CT performed at that time were both negative.  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  This has become worse and then stabilized at a worse state.  She is currently on very high doses of gabapentin   REVIEW OF SYSTEMS:  Jeanette Boone tells me she was home, got up from a sitting position to get something for her husband and felt like she was going to pass out and then she actually lost consciousness.  She has had presyncopal symptoms before but has never actually passed out before.  Again she is on a very high dose of gabapentin and this may be the reason.  She is not on blood pressure medication.  That particular day she had also been dehydrated.  Aside from this she exercises by walking 2 miles about 3 times a week.  She has had both her Covid vaccine doses.  A detailed review of systems  today was otherwise stable.   BREAST CANCER HISTORY:  From the original intake note:  She herself palpated a mass in her right breast in 04/2008.  The patient tells me that she had lost approximately 100 pounds by diet and exercise over the past year, and that this made her breasts lumpier feeling.  In any case, she brought this question to Dr. Truddie Hidden [facility]'s attention, and was set up for mammography at [facility] on 04/21/2008.  She had had a mammogram in 09/2007, which had been unremarkable.  The mammogram on 05/01/2008 showed a dense right breast with a suggestion of a possible mass in the upper central breast corresponding to the palpable abnormality.  Ultrasound was more revealing, and did show an irregularly bordered hypoechoic solid mass measuring 1.2 cm.  The patient was brought back for biopsy and clip placement on 04/23, and the pathology from that procedure ([id number] and [id number]) showed an invasive ductal carcinoma which was negative for the estrogen and progesterone receptor at 0 and 0 respectively.  The Ki-67 was 19%, and CISH was amplified with a ratio of 2.22 (this is just above the "indeterminate" range).  Bilateral breast MRIs were obtained on 04/30.  This showed in the right breast an enhancing rounded mass measuring up to 1.8 cm.  The left breast was unremarkable, and there were no enlarged or suspicious internal mammary or axillary nodes on either side.  With this information, the patient proceeded to definitive right lumpectomy and sentinel lymph node sampling on 05/16/2008.  The final pathology ([id number]) confirmed a 1.5 cm invasive ductal  carcinoma, grade 2, with negative margins, no evidence of lymphovascular invasion, and 0 of 3 sentinel lymph nodes involved.  The estrogen receptor test was repeated ([id number]), and was again negative with a strong positive control.  Her subsequent history is as detailed below.    PAST MEDICAL HISTORY: Past Medical History:   Diagnosis Date  . Breast cancer ([place])    right  . Cancer (HCC)    breast  . Chronic leg pain   . Endometriosis   . Family history of bladder cancer   . Family history of breast cancer   . Leg cramps 11/19/2016  . Obesity   . Paresthesias    Generalized  . 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 Thrombocytopenia, secondary    secondary to valtrex  She had an episode of cholecystitis, and underwent cholecystectomy on 06/01/2008 under Dr. Dalbert Batman.  The pathology there ([id number]) showed only chronic cholecystitis and cholelithiasis with a benign cystic duct lymph node. History of cold sores, and takes chronic suppressive therapy with Valtrex.      PAST SURGICAL HISTORY: Past Surgical History:  Procedure Laterality Date  . BREAST LUMPECTOMY Right 2010  . BREAST SURGERY  05/2008   lumpectomy - right  . CHOLECYSTECTOMY  05/2008  . COLONOSCOPY  08/2009  . HERNIA REPAIR  [id number]   umbilical  . HERNIA REPAIR  1996  . LAPAROSCOPY    . port a cath insertion  06/2008  . uterine polyp removal  06/2008    FAMILY HISTORY Family History  Problem Relation Age of Onset  . Heart disease Father        pacemaker----tachycardia  . Stroke Maternal Grandmother   . Bladder Cancer Maternal Grandmother        dx in her 5s  . Heart disease Maternal Grandfather 71       MI  . Stroke Maternal Grandfather   . Heart disease Paternal Grandfather 78       MI  . Hypertension Mother   . Breast cancer Other        MGF's sister   The patient's father died at the age of 22 from causes not quite clear to the patient, but he certainly had significant heart disease.  The patient's mother is alive.  She has a history of bladder cancer.  Both the patient's parents were single children.  The patient has one sister and two brothers.  There is no history of breast cancer in the immediate family.   GYNECOLOGIC HISTORY: Patient's last menstrual period was 05/13/2010. She is GX, P0. The patient tried for many years to  get pregnant and took "large doses of fertility drugs" for about 8 or 9 years before giving up on those attempts.   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  Her husband, [name], is an Chief Financial Officer.  Their two children, [name] 17 and Alessandra Grout, were adopted from [place].  [name] is planning to attend [employer] beginning the fall of 2021 and to become a Conservation officer, historic buildings.  The younger son likes to fish.  The patient attends the [employer].    ADVANCED DIRECTIVES: In the absence of any documentation to the contrary, the patient's spouse is their HCPOA.    HEALTH MAINTENANCE: Social History   Tobacco Use  . Smoking status: Former Research scientist (life sciences)  . Smokeless tobacco: Never Used  Substance Use Topics  . Alcohol use: Not Currently    Comment: 0  Colonoscopy: 2011  PAP: UTD/ [facility]  Bone density:  Lipid panel:   Allergies  Allergen Reactions  . Clarithromycin     Thrush  . Other     Steroids---Eye pressure  . Penicillins Other (See Comments)    Child hood Did it involve swelling of the face/tongue/throat, SOB, or low BP? Unknown Did it involve sudden or severe rash/hives, skin peeling, or any reaction on the inside of your mouth or nose? Unknown Did you need to seek medical attention at a hospital or doctor's office? Unknown When did it last happen? If all above answers are "NO", may proceed with cephalosporin use.      Current Outpatient Medications on File Prior to Visit  Medication Sig Dispense Refill  . B Complex Vitamins (B-COMPLEX/B-12 PO) Take 1 tablet by mouth daily.     . Biotin 1000 MCG tablet Take 1,000 mcg by mouth daily.     . cholecalciferol (VITAMIN D) 1000 UNITS tablet Take 2,000 Units by mouth daily.    . hydroxypropyl methylcellulose / hypromellose (ISOPTO TEARS / GONIOVISC) 2.5 % ophthalmic solution Place 1 drop into both eyes 3 (three) times daily as needed for dry eyes.     . Multiple Vitamin (MULTIVITAMIN) tablet Take 1 tablet by mouth daily.    . valACYclovir (VALTREX) 500 MG tablet Take 1 tablet (500 mg total) by mouth daily. 90 tablet 4   No current facility-administered medications on file prior to visit.    OBJECTIVE:  white woman in no acute distress  Vitals:   04/19/19 1010  BP: 122/83  Pulse: (!) 58  Resp: 18  Temp: 98 F (36.7 C)  TempSrc: Temporal  SpO2: 100%  Weight: 182 lb 3.2 oz (82.6 kg)  Height: 5' 7.5" (1.715 m)  Body mass index is 28.12 kg/m.   Sclerae unicteric, EOMs intact Wearing a mask No cervical or supraclavicular adenopathy Lungs no rales or rhonchi Heart regular rate and rhythm Abd soft, nontender, positive bowel sounds MSK no focal spinal tenderness, no upper extremity lymphedema Neuro: nonfocal, well oriented, appropriate affect Breasts: The right breast has undergone lumpectomy and radiation.  There is some firmness and some skin change consistent with prior treatment.  There is no evidence of disease recurrence.  The left breast is benign.  Both axillae are benign.   LAB RESULTS: CBC    Component Value Date/Time   WBC 4.4 04/19/2019 0943   WBC 6.2 12/13/2018 0625   RBC 4.22 04/19/2019 0943   HGB 13.5 04/19/2019 0943   HGB 12.6 05/30/2016 1230   HCT 41.2 04/19/2019 0943   HCT 37.8 05/30/2016 1230   PLT 212 04/19/2019 0943   PLT 140 (L) 05/30/2016 1230   MCV 97.6 04/19/2019 0943   MCV 101.8 (H) 05/30/2016 1230   MCH 32.0 04/19/2019 0943   MCHC 32.8 04/19/2019 0943   RDW 11.8 04/19/2019 0943   RDW 12.8 05/30/2016 1230   LYMPHSABS 1.6 04/19/2019 0943   LYMPHSABS 1.3 05/30/2016 1230   MONOABS 0.3 04/19/2019 0943   MONOABS 0.2 05/30/2016 1230   EOSABS 0.2 04/19/2019 0943   EOSABS 0.1 05/30/2016 1230   BASOSABS 0.0 04/19/2019 0943   BASOSABS 0.0 05/30/2016 1230    CMP Latest Ref Rng & Units 12/13/2018 10/29/2018 07/16/2018  Glucose 70 - 99 mg/dL 93 88 97  BUN 6 - 20 mg/dL '9 10 12  ' Creatinine 0.44 -  1.00 mg/dL 0.81 0.83 0.91  Sodium 135 - 145 mmol/L 140 140 142  Potassium 3.5 -  5.1 mmol/L 4.2 4.0 4.5  Chloride 98 - 111 mmol/L 101 103 106  CO2 22 - 32 mmol/L '28 31 27  ' Calcium 8.9 - 10.3 mg/dL 9.7 9.5 9.4  Total Protein 6.5 - 8.1 g/dL 7.0 6.5 7.2  Total Bilirubin 0.3 - 1.2 mg/dL 0.9 0.6 0.4  Alkaline Phos 38 - 126 U/L 108 126(H) 146(H)  AST 15 - 41 U/L 39 22 26  ALT 0 - 44 U/L 39 22 24    STUDIES: MM 3D SCREEN BREAST BILATERAL  Result Date: 04/07/2019 CLINICAL DATA:  Screening. EXAM: DIGITAL SCREENING BILATERAL MAMMOGRAM WITH TOMO AND CAD COMPARISON:  Previous exam(s). ACR Breast Density Category c: The breast tissue is heterogeneously dense, which may obscure small masses. FINDINGS: There are no findings suspicious for malignancy. Images were processed with CAD. IMPRESSION: No mammographic evidence of malignancy. A result letter of this screening mammogram will be mailed directly to the patient. RECOMMENDATION: Screening mammogram in one year. (Code:SM-B-01Y) BI-RADS CATEGORY  1: Negative. Electronically Signed   By: Kristopher Oppenheim M.D.   On: 04/07/2019 10:59    ASSESSMENT: 59 y.o.  BRCA negative [place] woman  (1) status post right upper inner quadrant lumpectomy and sentinel lymph node dissection May 2010 for a T1c N0, stage 1A invasive ductal carcinoma,  grade 2,  estrogen and progesterone receptor negative, but HER-2/neu positive.  The MIB-1 was 19%.   (2) received 4 cycles of docetaxel/carboplatin/trastuzumab adjuvantly  (3) she continued Herceptin to July 2010 with echocardiogram showing a well-preserved ejection fraction after the completion of treatment.  (4) she completed radiation in December 2010.   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 Sequencing was performed for select regions of POLE and POLD1, and large rearrangement analysis was performed for select regions of GREM1.   (7) persistent mild transaminase elevation, with negative hepatitis and HIV serologies and no evidence of metastases by CT scan of the chest 02/28/2016  (a) hepatic steatosis noted on abdominal MRI/MRCP 10/21/2016  (8) iron deficiency, status post Feraheme 03/08/2016  (9) category C/D breast density  (a) breast MRI 08/12/2017 shows no evidence of disease  (b) mammography with tomography 03/20/2018 unremarkable  (c) bilateral screening mammography 04/07/2019: density category C, no evidence of malignancy  (d) breast MRI September 2021   PLAN:  Jeanette Boone is now just about 11 years out from definitive surgery for her breast cancer with no evidence of disease recurrence.  This is very favorable.  She has a new adenopathy which is so far unexplained.  I think it is unlikely to be paraneoplastic since we have no evidence of disease now 11 years after surgery.  We did restage her late last year just to make sure her and those scans were negative.  She does have dense breast and I think she will benefit from a repeat breast MRI late this year.  I have entered that order.  I wonder if she might benefit from a dietary change, perhaps avoiding all gluten.  Sometimes people report on symptoms resolving with similar measures.  That is of course that shot in the dark but that was my suggestion to her  Otherwise she will have her next mammogram in March and see me again in 1 year  She knows to call for any  other issue that may develop before the next visit.  Total encounter time 30 minutes.*   Margalit Leece, Virgie Dad, MD  04/19/19 10:12 AM Medical Oncology and Hematology [facility] [place], [place] [address] Tel. [phone]    Fax. [phone]   I, Wilburn Mylar, am acting as scribe for Dr. Virgie Dad. Elisha Cooksey.  I, Lurline Del MD, have reviewed the above documentation for accuracy and completeness, and I agree with the above.   *Total Encounter Time as defined by the Centers for Medicare and Medicaid Services includes, in addition to the face-to-face time of a patient visit (documented in the note above) non-face-to-face time: obtaining and reviewing outside history, ordering and reviewing medications, tests or procedures, care coordination (communications with other health care professionals or caregivers) and documentation in the medical record.

## 2019-04-19 ENCOUNTER — Inpatient Hospital Stay: Payer: Managed Care, Other (non HMO) | Attending: Oncology | Admitting: Oncology

## 2019-04-19 ENCOUNTER — Ambulatory Visit (INDEPENDENT_AMBULATORY_CARE_PROVIDER_SITE_OTHER): Payer: Managed Care, Other (non HMO) | Admitting: Neurology

## 2019-04-19 ENCOUNTER — Inpatient Hospital Stay: Payer: Managed Care, Other (non HMO)

## 2019-04-19 ENCOUNTER — Other Ambulatory Visit: Payer: Self-pay

## 2019-04-19 ENCOUNTER — Encounter: Payer: Self-pay | Admitting: Neurology

## 2019-04-19 VITALS — BP 122/83 | HR 58 | Temp 98.0°F | Resp 18 | Ht 67.5 in | Wt 182.2 lb

## 2019-04-19 VITALS — BP 121/81 | HR 64 | Temp 97.1°F | Ht 67.5 in

## 2019-04-19 DIAGNOSIS — G549 Nerve root and plexus disorder, unspecified: Secondary | ICD-10-CM

## 2019-04-19 DIAGNOSIS — C50211 Malignant neoplasm of upper-inner quadrant of right female breast: Secondary | ICD-10-CM

## 2019-04-19 DIAGNOSIS — Z853 Personal history of malignant neoplasm of breast: Secondary | ICD-10-CM | POA: Insufficient documentation

## 2019-04-19 DIAGNOSIS — R922 Inconclusive mammogram: Secondary | ICD-10-CM

## 2019-04-19 DIAGNOSIS — Z171 Estrogen receptor negative status [ER-]: Secondary | ICD-10-CM | POA: Diagnosis not present

## 2019-04-19 DIAGNOSIS — R599 Enlarged lymph nodes, unspecified: Secondary | ICD-10-CM | POA: Insufficient documentation

## 2019-04-19 LAB — CBC WITH DIFFERENTIAL (CANCER CENTER ONLY)
Abs Immature Granulocytes: 0 10*3/uL (ref 0.00–0.07)
Basophils Absolute: 0 10*3/uL (ref 0.0–0.1)
Basophils Relative: 1 %
Eosinophils Absolute: 0.2 10*3/uL (ref 0.0–0.5)
Eosinophils Relative: 3 %
HCT: 41.2 % (ref 36.0–46.0)
Hemoglobin: 13.5 g/dL (ref 12.0–15.0)
Immature Granulocytes: 0 %
Lymphocytes Relative: 35 %
Lymphs Abs: 1.6 10*3/uL (ref 0.7–4.0)
MCH: 32 pg (ref 26.0–34.0)
MCHC: 32.8 g/dL (ref 30.0–36.0)
MCV: 97.6 fL (ref 80.0–100.0)
Monocytes Absolute: 0.3 10*3/uL (ref 0.1–1.0)
Monocytes Relative: 6 %
Neutro Abs: 2.4 10*3/uL (ref 1.7–7.7)
Neutrophils Relative %: 55 %
Platelet Count: 212 10*3/uL (ref 150–400)
RBC: 4.22 MIL/uL (ref 3.87–5.11)
RDW: 11.8 % (ref 11.5–15.5)
WBC Count: 4.4 10*3/uL (ref 4.0–10.5)
nRBC: 0 % (ref 0.0–0.2)

## 2019-04-19 LAB — CMP (CANCER CENTER ONLY)
ALT: 22 U/L (ref 0–44)
AST: 23 U/L (ref 15–41)
Albumin: 3.9 g/dL (ref 3.5–5.0)
Alkaline Phosphatase: 130 U/L — ABNORMAL HIGH (ref 38–126)
Anion gap: 7 (ref 5–15)
BUN: 16 mg/dL (ref 6–20)
CO2: 30 mmol/L (ref 22–32)
Calcium: 9.5 mg/dL (ref 8.9–10.3)
Chloride: 104 mmol/L (ref 98–111)
Creatinine: 0.95 mg/dL (ref 0.44–1.00)
GFR, Est AFR Am: >60 mL/min (ref >60)
GFR, Estimated: >60 mL/min (ref >60)
Glucose, Bld: 89 mg/dL (ref 70–99)
Potassium: 4.4 mmol/L (ref 3.5–5.1)
Sodium: 141 mmol/L (ref 135–145)
Total Bilirubin: 0.4 mg/dL (ref 0.3–1.2)
Total Protein: 7.3 g/dL (ref 6.5–8.1)

## 2019-04-19 MED ORDER — DULOXETINE HCL 30 MG PO CPEP: ORAL_CAPSULE | ORAL | 3 refills | Status: DC

## 2019-04-19 MED ORDER — GABAPENTIN 300 MG PO CAPS: ORAL_CAPSULE | ORAL | 3 refills | Status: DC

## 2019-04-19 NOTE — Progress Notes (Signed)
Reason for visit: Paresthesias  Jeanette Boone is an 59 y.o. female  History of present illness:  Jeanette Boone is a 59 year old right-handed white female with a history of breast cancer.  The patient has had sensory alteration that began prior to the diagnosis of her breast cancer, paraneoplastic antibodies have never been documented.  Nerve conduction studies have been normal, the last such study was done in the summer 2020.  The patient has had an increase in her clinical symptomatology over the last year but she believes that it has currently stabilized.  She has sensory alterations and discomfort in the head, and the teeth and tongue.  This is more noticeable when she is inactive.  The sensory alterations impact her physical abilities very little.  She is on maximum doses of gabapentin taking 900 mg 4 times daily and she is on low-dose Cymbalta 30 mg daily.  She has not wanted to go up on the Cymbalta dose.  On 13 December 2018, the patient was seen in emergency room after she had a syncopal event.  She had gotten up quickly and then started getting woozy and then actually blacked out striking her chin with a laceration.  CT scan of the brain at that time was unremarkable.  She has not had any further recurrences.  The patient returns for further evaluation.  Past Medical History:  Diagnosis Date  . Breast cancer ([place])    right  . Cancer (HCC)    breast  . Chronic leg pain   . Endometriosis   . Family history of bladder cancer   . Family history of breast cancer   . Leg cramps 11/19/2016  . Obesity   . Paresthesias    Generalized  . 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 Thrombocytopenia, secondary    secondary to valtrex    Past Surgical History:  Procedure Laterality Date  . BREAST LUMPECTOMY Right 2010  . BREAST SURGERY  05/2008   lumpectomy - right  . CHOLECYSTECTOMY  05/2008  . COLONOSCOPY  08/2009  . HERNIA REPAIR  [id number]   umbilical  . HERNIA REPAIR  1996  . LAPAROSCOPY    .  port a cath insertion  06/2008  . uterine polyp removal  06/2008    Family History  Problem Relation Age of Onset  . Heart disease Father        pacemaker----tachycardia  . Stroke Maternal Grandmother   . Bladder Cancer Maternal Grandmother        dx in her 82s  . Heart disease Maternal Grandfather 91       MI  . Stroke Maternal Grandfather   . Heart disease Paternal Grandfather 61       MI  . Hypertension Mother   . Breast cancer Other        MGF's sister    Social history:  reports that she has quit smoking. She has never used smokeless tobacco. She reports previous alcohol use. She reports that she does not use drugs.    Allergies  Allergen Reactions  . Clarithromycin     Thrush  . Other     Steroids---Eye pressure  . Penicillins Other (See Comments)    Child hood Did it involve swelling of the face/tongue/throat, SOB, or low BP? Unknown Did it involve sudden or severe rash/hives, skin peeling, or any reaction on the inside of your mouth or nose? Unknown Did you need to seek medical attention at a hospital or doctor's office? Unknown When  did it last happen? If all above answers are "NO", may proceed with cephalosporin use.      Medications:  Prior to Admission medications   Medication Sig Start Date End Date Taking? Authorizing Provider  B Complex Vitamins (B-COMPLEX/B-12 PO) Take 1 tablet by mouth daily.    Yes [provider]  Biotin 1000 MCG tablet Take 1,000 mcg by mouth daily.    Yes [provider]  cholecalciferol (VITAMIN D) 1000 UNITS tablet Take 2,000 Units by mouth daily.   Yes [provider]  DULoxetine (CYMBALTA) 30 MG capsule TAKE ONE CAPSULE BY MOUTH EVERY DAY Patient taking differently: Take 30 mg by mouth daily.  02/17/18  Yes Kathrynn Ducking, MD  gabapentin (NEURONTIN) 300 MG capsule TAKE 3 CAPSULES BY MOUTH FOUR TIMES DAILY Patient taking differently: Take 900 mg by mouth 4 (four) times daily.  02/12/18  Yes  Kathrynn Ducking, MD  hydroxypropyl methylcellulose / hypromellose (ISOPTO TEARS / GONIOVISC) 2.5 % ophthalmic solution Place 1 drop into both eyes 3 (three) times daily as needed for dry eyes.   Yes [provider]  Multiple Vitamin (MULTIVITAMIN) tablet Take 1 tablet by mouth daily.   Yes [provider]  valACYclovir (VALTREX) 500 MG tablet Take 1 tablet (500 mg total) by mouth daily. 07/21/18  Yes Princess Bruins, MD    ROS:  Out of a complete 14 system review of symptoms, the patient complains only of the following symptoms, and all other reviewed systems are negative.  Numbness, discomfort  Blood pressure 121/81, pulse 64, temperature (!) 97.1 F (36.2 C), height 5' 7.5" (1.715 m), last menstrual period 05/13/2010.  Physical Exam  General: The patient is alert and cooperative at the time of the examination.  Skin: No significant peripheral edema is noted.   Neurologic Exam  Mental status: The patient is alert and oriented x 3 at the time of the examination. The patient has apparent normal recent and remote memory, with an apparently normal attention span and concentration ability.   Cranial nerves: Facial symmetry is present. Speech is normal, no aphasia or dysarthria is noted. Extraocular movements are full. Visual fields are full.  Motor: The patient has good strength in all 4 extremities.  Sensory examination: Soft touch sensation is symmetric on the face, arms, and legs.  Coordination: The patient has good finger-nose-finger and heel-to-shin bilaterally.  Gait and station: The patient has a normal gait. Tandem gait is minimally unsteady. Romberg is negative. No drift is seen.  Reflexes: Deep tendon reflexes are symmetric.   CT head 12/12/18:  IMPRESSION: 1. No acute intracranial pathology. 2. Mild bifrontal cortical atrophy.  * CT scan images were reviewed online. I agree with the written report.    Assessment/Plan:  1.  Sensory  paresthesias, etiology unclear  The patient continues to have subjective sensory alterations that are now affecting the head and mouth area.  The patient does not wish to go up on the Cymbalta, we discussed potentially switching from gabapentin to Lyrica in maximum doses.  The patient will think about this, but for now she will stay with the gabapentin.  Prescriptions were sent in for the Cymbalta and the gabapentin.  She will follow-up here otherwise in 1 year, sooner if needed.  Jill Alexanders MD 04/19/2019 8:12 AM  [facility] [address] [place],  [address]  Phone [phone] Fax [phone]

## 2019-04-20 ENCOUNTER — Telehealth: Payer: Self-pay | Admitting: Oncology

## 2019-04-20 NOTE — Telephone Encounter (Signed)
Scheduled appt per 4/12 los. Left voicemail with new appt date and time. Mailed reminder letter and calendar.

## 2019-07-22 ENCOUNTER — Encounter: Payer: Managed Care, Other (non HMO) | Admitting: Obstetrics & Gynecology

## 2019-08-03 ENCOUNTER — Encounter: Payer: Managed Care, Other (non HMO) | Admitting: Obstetrics & Gynecology

## 2019-08-09 ENCOUNTER — Other Ambulatory Visit: Payer: Self-pay | Admitting: Obstetrics & Gynecology

## 2019-08-09 NOTE — Telephone Encounter (Signed)
annual exam scheduled on 10/20/19

## 2019-09-01 ENCOUNTER — Encounter: Payer: Self-pay | Admitting: Gastroenterology

## 2019-09-14 ENCOUNTER — Ambulatory Visit
Admission: RE | Admit: 2019-09-14 | Discharge: 2019-09-14 | Disposition: A | Discharge status: Home / Self Care | Payer: Managed Care, Other (non HMO) | Source: Ambulatory Visit | Attending: Oncology | Admitting: Oncology

## 2019-09-14 ENCOUNTER — Other Ambulatory Visit: Payer: Self-pay

## 2019-09-14 DIAGNOSIS — G549 Nerve root and plexus disorder, unspecified: Secondary | ICD-10-CM

## 2019-09-14 DIAGNOSIS — R922 Inconclusive mammogram: Secondary | ICD-10-CM

## 2019-09-14 DIAGNOSIS — Z171 Estrogen receptor negative status [ER-]: Secondary | ICD-10-CM

## 2019-09-14 MED ORDER — GADOBUTROL 1 MMOL/ML IV SOLN
10.0000 mL | Freq: Once | INTRAVENOUS | Status: AC | PRN
  Administered 2019-09-14: 10 mL via INTRAVENOUS

## 2019-09-15 NOTE — Progress Notes (Signed)
RN notified patient of results, verbalized understanding.  No further needs at this time.

## 2019-10-20 ENCOUNTER — Encounter: Payer: Managed Care, Other (non HMO) | Admitting: Obstetrics & Gynecology

## 2019-11-04 ENCOUNTER — Encounter: Payer: Managed Care, Other (non HMO) | Admitting: Family Medicine

## 2019-11-05 ENCOUNTER — Other Ambulatory Visit: Payer: Self-pay | Admitting: Obstetrics & Gynecology

## 2019-11-05 NOTE — Telephone Encounter (Signed)
CE scheduled 01/05/20 with Dr. Marguerita Merles.

## 2019-12-23 ENCOUNTER — Encounter: Payer: Managed Care, Other (non HMO) | Admitting: Family Medicine

## 2019-12-28 ENCOUNTER — Encounter: Payer: Managed Care, Other (non HMO) | Admitting: Obstetrics & Gynecology

## 2020-01-05 ENCOUNTER — Encounter: Payer: Managed Care, Other (non HMO) | Admitting: Obstetrics & Gynecology

## 2020-01-30 ENCOUNTER — Other Ambulatory Visit: Payer: Self-pay | Admitting: Obstetrics & Gynecology

## 2020-01-31 NOTE — Telephone Encounter (Signed)
Annual exam scheduled on 02/22/20

## 2020-02-22 ENCOUNTER — Encounter: Payer: Managed Care, Other (non HMO) | Admitting: Obstetrics & Gynecology

## 2020-02-28 ENCOUNTER — Other Ambulatory Visit: Payer: Self-pay

## 2020-02-29 ENCOUNTER — Ambulatory Visit (INDEPENDENT_AMBULATORY_CARE_PROVIDER_SITE_OTHER): Payer: Managed Care, Other (non HMO) | Admitting: Family Medicine

## 2020-02-29 ENCOUNTER — Encounter: Payer: Self-pay | Admitting: Family Medicine

## 2020-02-29 VITALS — BP 108/70 | HR 68 | Temp 98.6°F | Resp 18 | Ht 67.5 in | Wt 230.2 lb

## 2020-02-29 DIAGNOSIS — R21 Rash and other nonspecific skin eruption: Secondary | ICD-10-CM

## 2020-02-29 DIAGNOSIS — R0789 Other chest pain: Secondary | ICD-10-CM

## 2020-02-29 DIAGNOSIS — Z23 Encounter for immunization: Secondary | ICD-10-CM | POA: Diagnosis not present

## 2020-02-29 DIAGNOSIS — Z0000 Encounter for general adult medical examination without abnormal findings: Secondary | ICD-10-CM | POA: Diagnosis not present

## 2020-02-29 MED ORDER — CLOTRIMAZOLE-BETAMETHASONE 1-0.05 % EX CREA: 1.0000 "application " | TOPICAL_CREAM | Freq: Two times a day (BID) | CUTANEOUS | 0 refills | Status: DC

## 2020-02-29 NOTE — Progress Notes (Signed)
Subjective:     Jeanette Boone is a 60 y.o. female and is here for a comprehensive physical exam. The patient reports problems - pt c/o chest pain / sob that is new-- she knows it could be from her weight gain and deconditioning , but she is concerned due to family hx of heart problems  She is also c/o rash on her leg after a pedicure --- it went away but then came back and worsened with another pedicure    She saw the derm for this but it is not going away   Social History   Socioeconomic History  . Marital status: Married    Spouse name: donald  . Number of children: 0  . Years of education: college  . Highest education level: Not on file  Occupational History  . Occupation: Pharmacist, hospital.    Employer: [employer]  Tobacco Use  . Smoking status: Former Research scientist (life sciences)  . Smokeless tobacco: Never Used  Vaping Use  . Vaping Use: Never used  Substance and Sexual Activity  . Alcohol use: Not Currently    Comment: 0  . Drug use: Never  . Sexual activity: Yes    Partners: Male    Comment: refused to answer insurance questions  Other Topics Concern  . Not on file  Social History Narrative   Exercise-- yes      Patient is right handed.   Patient does not drink caffeine.   Social Determinants of Health   Financial Resource Strain: Not on file  Food Insecurity: Not on file  Transportation Needs: Not on file  Physical Activity: Not on file  Stress: Not on file  Social Connections: Not on file  Intimate Partner Violence: Not on file   Health Maintenance  Topic Date Due  . INFLUENZA VACCINE  08/08/2019  . COLONOSCOPY (Pts 45-61yrs Insurance coverage will need to be confirmed)  09/08/2019  . MAMMOGRAM  04/06/2020  . COVID-19 Vaccine (4 - Booster for Pfizer series) 06/23/2020  . PAP SMEAR-Modifier  07/21/2021  . TETANUS/TDAP  10/29/2028  . Hepatitis C Screening  Completed  . 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 She does not have any pertinent problems on file. She  has a past surgical history that includes Hernia repair (1995); Hernia repair (1996); Cholecystectomy (05/2008); uterine polyp removal (06/2008); Colonoscopy (08/2009); port a cath insertion (06/2008); Breast surgery (05/2008); laparoscopy; and Breast lumpectomy (Right, 2010). Her family history includes Bladder Cancer in her maternal grandmother; Breast cancer in an other family member; Congestive Heart Failure (age of onset: 16) in her father; Heart disease in her father; Heart disease (age of onset: 42) in her maternal grandfather; Heart disease (age of onset: 59) in her paternal grandfather; Hypertension in her mother; Stroke in her maternal grandfather and maternal grandmother. She  reports that she has quit smoking. She has never used smokeless tobacco. She reports previous alcohol use. She reports that she does not use drugs. She has a current medication list which includes the following prescription(s): b complex vitamins, biotin, cholecalciferol, clotrimazole-betamethasone, duloxetine, gabapentin, hydroxypropyl methylcellulose / hypromellose, multivitamin, and valacyclovir. Current Outpatient Medications on File Prior to Visit  Medication Sig Dispense Refill  . B  Complex Vitamins (B-COMPLEX/B-12 PO) Take 1 tablet by mouth daily.     . Biotin 1000 MCG tablet Take 1,000 mcg by mouth daily.     . cholecalciferol (VITAMIN D) 1000 UNITS tablet Take 2,000 Units by mouth daily.    . DULoxetine (CYMBALTA) 30 MG capsule TAKE ONE CAPSULE BY MOUTH EVERY DAY 90 capsule 3  . gabapentin (NEURONTIN) 300 MG capsule TAKE 3 CAPSULES BY MOUTH FOUR TIMES DAILY 1080 capsule  3  . hydroxypropyl methylcellulose / hypromellose (ISOPTO TEARS / GONIOVISC) 2.5 % ophthalmic solution Place 1 drop into both eyes 3 (three) times daily as needed for dry eyes.    . Multiple Vitamin (MULTIVITAMIN) tablet Take 1 tablet by mouth daily.    . valACYclovir (VALTREX) 500 MG tablet TAKE 1 TABLET BY MOUTH DAILY 90 tablet 0   No current facility-administered medications on file prior to visit.   She is allergic to clarithromycin, other, and penicillins..  Review of Systems Review of Systems  Constitutional: Negative for activity change, appetite change and fatigue.  HENT: Negative for hearing loss, congestion, tinnitus and ear discharge.  dentist q1m Eyes: Negative for visual disturbance (see optho q1y -- vision corrected to 20/20 with glasses).  Respiratory: Negative for cough, chest tightness  + chest pain with sob   Cardiovascular: Negative , palpitations and leg swelling. +chest pain  Gastrointestinal: Negative for abdominal pain, diarrhea, constipation and abdominal distention.  Genitourinary: Negative for urgency, frequency, decreased urine volume and difficulty urinating.  Musculoskeletal: Negative for back pain, arthralgias and gait problem.  Skin: Negative for color change, pallor  + rash  Neurological: Negative for dizziness, light-headedness, numbness and headaches.  Hematological: Negative for adenopathy. Does not bruise/bleed easily.  Psychiatric/Behavioral: Negative for suicidal ideas, confusion, sleep disturbance, self-injury, dysphoric mood, decreased concentration and agitation.       Objective:    BP 108/70 (BP Location: Left Arm, Patient Position: Sitting, Cuff Size: Large)   Pulse 68   Temp 98.6 F (37 C) (Oral)   Resp 18   Ht 5' 7.5" (1.715 m)   Wt 230 lb 3.2 oz (104.4 kg)   LMP 05/13/2010   SpO2 99%   BMI 35.52 kg/m  General appearance: alert, cooperative, appears stated age and no distress Head: Normocephalic, without obvious abnormality,  atraumatic Eyes: conjunctivae/corneas clear. PERRL, EOM's intact. Fundi benign. Ears: normal TM's and external ear canals both ears Nose: Nares normal. Septum midline. Mucosa normal. No drainage or sinus tenderness. Throat: lips, mucosa, and tongue normal; teeth and gums normal Neck: no adenopathy, no carotid bruit, no JVD, supple, symmetrical, trachea midline and thyroid not enlarged, symmetric, no tenderness/mass/nodules Back: symmetric, no curvature. ROM normal. No CVA tenderness. Lungs: clear to auscultation bilaterally Breasts: gyn Heart: regular rate and rhythm, S1, S2 normal, no murmur, click, rub or gallop Abdomen: soft, non-tender; bowel sounds normal; no masses,  no organomegaly Pelvic: deferred --- gyn Extremities: extremities normal, atraumatic, no cyanosis or edema Pulses: 2+ and symmetric Skin: Skin color, texture, turgor normal. No rashes or lesions Lymph nodes: Cervical, supraclavicular, and axillary nodes normal. Neurologic: Alert and oriented X 3, normal strength and tone. Normal symmetric reflexes. Normal coordination and gait    Assessment:    Healthy female exam.      Plan:    ghm utd Check labs  Flu shot given today  See After Visit Summary for Counseling Recommendations    1. Morbid obesity ([place])  - Amb Ref to Medical Weight Management  2. Other chest pain  ekg -- sinus brady Check labs and echo Consider cardiology referral   - EKG 12-Lead - ECHOCARDIOGRAM COMPLETE; Future  3. Rash  Cream per orders F/u derm if no better  - clotrimazole-betamethasone (LOTRISONE) cream; Apply 1 application topically 2 (two) times daily.  Dispense: 30 g; Refill: 0  4. Need for influenza vaccination   - Flu Vaccine QUAD 36+ mos IM  5. Preventative health care See above  - CBC with Differential/Platelet - Lipid panel - TSH - Comprehensive metabolic panel

## 2020-02-29 NOTE — Patient Instructions (Signed)
Preventive Care 84-59 Years Old, Female Preventive care refers to lifestyle choices and visits with your health care provider that can promote health and wellness. This includes:  A yearly physical exam. This is also called an annual wellness visit.  Regular dental and eye exams.  Immunizations.  Screening for certain conditions.  Healthy lifestyle choices, such as: ? Eating a healthy diet. ? Getting regular exercise. ? Not using drugs or products that contain nicotine and tobacco. ? Limiting alcohol use. What can I expect for my preventive care visit? Physical exam Your health care provider will check your:  Height and weight. These may be used to calculate your BMI (body mass index). BMI is a measurement that tells if you are at a healthy weight.  Heart rate and blood pressure.  Body temperature.  Skin for abnormal spots. Counseling Your health care provider may ask you questions about your:  Past medical problems.  Family's medical history.  Alcohol, tobacco, and drug use.  Emotional well-being.  Home life and relationship well-being.  Sexual activity.  Diet, exercise, and sleep habits.  Work and work Statistician.  Access to firearms.  Method of birth control.  Menstrual cycle.  Pregnancy history. What immunizations do I need? Vaccines are usually given at various ages, according to a schedule. Your health care provider will recommend vaccines for you based on your age, medical history, and lifestyle or other factors, such as travel or where you work.   What tests do I need? Blood tests  Lipid and cholesterol levels. These may be checked every 5 years, or more often if you are over 3 years old.  Hepatitis C test.  Hepatitis B test. Screening  Lung cancer screening. You may have this screening every year starting at age 73 if you have a 30-pack-year history of smoking and currently smoke or have quit within the past 15 years.  Colorectal cancer  screening. ? All adults should have this screening starting at age 52 and continuing until age 17. ? Your health care provider may recommend screening at age 49 if you are at increased risk. ? You will have tests every 1-10 years, depending on your results and the type of screening test.  Diabetes screening. ? This is done by checking your blood sugar (glucose) after you have not eaten for a while (fasting). ? You may have this done every 1-3 years.  Mammogram. ? This may be done every 1-2 years. ? Talk with your health care provider about when you should start having regular mammograms. This may depend on whether you have a family history of breast cancer.  BRCA-related cancer screening. This may be done if you have a family history of breast, ovarian, tubal, or peritoneal cancers.  Pelvic exam and Pap test. ? This may be done every 3 years starting at age 10. ? Starting at age 11, this may be done every 5 years if you have a Pap test in combination with an HPV test. Other tests  STD (sexually transmitted disease) testing, if you are at risk.  Bone density scan. This is done to screen for osteoporosis. You may have this scan if you are at high risk for osteoporosis. Talk with your health care provider about your test results, treatment options, and if necessary, the need for more tests. Follow these instructions at home: Eating and drinking  Eat a diet that includes fresh fruits and vegetables, whole grains, lean protein, and low-fat dairy products.  Take vitamin and mineral supplements  as recommended by your health care provider.  Do not drink alcohol if: ? Your health care provider tells you not to drink. ? You are pregnant, may be pregnant, or are planning to become pregnant.  If you drink alcohol: ? Limit how much you have to 0-1 drink a day. ? Be aware of how much alcohol is in your drink. In the [place] one drink equals one 12 oz bottle of beer (355 mL), one 5 oz glass of  wine (148 mL), or one 1 oz glass of hard liquor (44 mL).   Lifestyle  Take daily care of your teeth and gums. Brush your teeth every morning and night with fluoride toothpaste. Floss one time each day.  Stay active. Exercise for at least 30 minutes 5 or more days each week.  Do not use any products that contain nicotine or tobacco, such as cigarettes, e-cigarettes, and chewing tobacco. If you need help quitting, ask your health care provider.  Do not use drugs.  If you are sexually active, practice safe sex. Use a condom or other form of protection to prevent STIs (sexually transmitted infections).  If you do not wish to become pregnant, use a form of birth control. If you plan to become pregnant, see your health care provider for a prepregnancy visit.  If told by your health care provider, take low-dose aspirin daily starting at age 50.  Find healthy ways to cope with stress, such as: ? Meditation, yoga, or listening to music. ? Journaling. ? Talking to a trusted person. ? Spending time with friends and family. Safety  Always wear your seat belt while driving or riding in a vehicle.  Do not drive: ? If you have been drinking alcohol. Do not ride with someone who has been drinking. ? When you are tired or distracted. ? While texting.  Wear a helmet and other protective equipment during sports activities.  If you have firearms in your house, make sure you follow all gun safety procedures. What's next?  Visit your health care provider once a year for an annual wellness visit.  Ask your health care provider how often you should have your eyes and teeth checked.  Stay up to date on all vaccines. This information is not intended to replace advice given to you by your health care provider. Make sure you discuss any questions you have with your health care provider. Document Revised: 09/28/2019 Document Reviewed: 09/04/2017 Elsevier Patient Education  2021 [employer]  

## 2020-03-01 ENCOUNTER — Other Ambulatory Visit: Payer: Self-pay | Admitting: Family Medicine

## 2020-03-01 DIAGNOSIS — E785 Hyperlipidemia, unspecified: Secondary | ICD-10-CM

## 2020-03-01 LAB — LIPID PANEL
Cholesterol: 225 mg/dL — ABNORMAL HIGH (ref 0–200)
HDL: 77.8 mg/dL (ref >39.00)
LDL Cholesterol: 133 mg/dL — ABNORMAL HIGH (ref 0–99)
NonHDL: 146.95
Total CHOL/HDL Ratio: 3
Triglycerides: 70 mg/dL (ref 0.0–149.0)
VLDL: 14 mg/dL (ref 0.0–40.0)

## 2020-03-01 LAB — COMPREHENSIVE METABOLIC PANEL
ALT: 21 U/L (ref 0–35)
AST: 23 U/L (ref 0–37)
Albumin: 4.2 g/dL (ref 3.5–5.2)
Alkaline Phosphatase: 113 U/L (ref 39–117)
BUN: 13 mg/dL (ref 6–23)
CO2: 30 mEq/L (ref 19–32)
Calcium: 9.9 mg/dL (ref 8.4–10.5)
Chloride: 101 mEq/L (ref 96–112)
Creatinine, Ser: 0.97 mg/dL (ref 0.40–1.20)
GFR: 64.26 mL/min (ref >60.00)
Glucose, Bld: 83 mg/dL (ref 70–99)
Potassium: 4.2 mEq/L (ref 3.5–5.1)
Sodium: 137 mEq/L (ref 135–145)
Total Bilirubin: 0.5 mg/dL (ref 0.2–1.2)
Total Protein: 7.5 g/dL (ref 6.0–8.3)

## 2020-03-01 LAB — CBC WITH DIFFERENTIAL/PLATELET
Basophils Absolute: 0 10*3/uL (ref 0.0–0.1)
Basophils Relative: 0.9 % (ref 0.0–3.0)
Eosinophils Absolute: 0.1 10*3/uL (ref 0.0–0.7)
Eosinophils Relative: 2.3 % (ref 0.0–5.0)
HCT: 41.4 % (ref 36.0–46.0)
Hemoglobin: 13.8 g/dL (ref 12.0–15.0)
Lymphocytes Relative: 36.8 % (ref 12.0–46.0)
Lymphs Abs: 2 10*3/uL (ref 0.7–4.0)
MCHC: 33.4 g/dL (ref 30.0–36.0)
MCV: 95.5 fl (ref 78.0–100.0)
Monocytes Absolute: 0.4 10*3/uL (ref 0.1–1.0)
Monocytes Relative: 7 % (ref 3.0–12.0)
Neutro Abs: 2.8 10*3/uL (ref 1.4–7.7)
Neutrophils Relative %: 53 % (ref 43.0–77.0)
Platelets: 237 10*3/uL (ref 150.0–400.0)
RBC: 4.34 Mil/uL (ref 3.87–5.11)
RDW: 12.7 % (ref 11.5–15.5)
WBC: 5.4 10*3/uL (ref 4.0–10.5)

## 2020-03-01 LAB — TSH: TSH: 2.32 u[IU]/mL (ref 0.35–4.50)

## 2020-03-02 ENCOUNTER — Other Ambulatory Visit: Payer: Self-pay | Admitting: Family Medicine

## 2020-03-02 DIAGNOSIS — R079 Chest pain, unspecified: Secondary | ICD-10-CM

## 2020-03-29 ENCOUNTER — Other Ambulatory Visit: Payer: Self-pay

## 2020-03-29 ENCOUNTER — Ambulatory Visit (HOSPITAL_BASED_OUTPATIENT_CLINIC_OR_DEPARTMENT_OTHER)
Admission: RE | Admit: 2020-03-29 | Discharge: 2020-03-29 | Disposition: A | Discharge status: Home / Self Care | Payer: Managed Care, Other (non HMO) | Source: Ambulatory Visit | Attending: Family Medicine | Admitting: Family Medicine

## 2020-03-29 DIAGNOSIS — R079 Chest pain, unspecified: Secondary | ICD-10-CM | POA: Insufficient documentation

## 2020-03-29 LAB — ECHOCARDIOGRAM COMPLETE
Area-P 1/2: 4.49 cm2
S' Lateral: 2.56 cm

## 2020-04-11 ENCOUNTER — Ambulatory Visit: Payer: Managed Care, Other (non HMO) | Admitting: Obstetrics & Gynecology

## 2020-04-17 NOTE — Progress Notes (Signed)
ID: Jeanette Boone   DOB: [birth year]  MR#: [id number]  IDH#:[id number]  Patient Care Team: Carollee Herter, Alferd Apa, DO as PCP - General Kathrynn Ducking, MD as Consulting Physician (Neurology) Jari Pigg, MD as Consulting Physician (Dermatology) Clarene Essex, MD as Consulting Physician (Gastroenterology) Magrinat, Virgie Dad, MD as Consulting Physician (Oncology) Princess Bruins, MD as Consulting Physician (Obstetrics and Gynecology) OTHER MD:  CHIEF COMPLAINT: HER-2 positive breast cancer  CURRENT TREATMENT: Observation   INTERVAL HISTORY:  Jeanette Boone returns today for follow-up of her estrogen receptor negative breast cancer. She continues under observation.   Since her last visit, she underwent breast MRI on 09/14/2019 showing: breast composition C; no evidence of malignancy in either breast.  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 She is currently on very high doses of gabapentin  Her last mammogram was 04/07/2019.  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  However she says that for the whole of last year she has not been exercising.  She knows that she should.  She has gained quite a bit of weight she says.  There are no other unusual stressors that she is aware of.  The neuropathy in particular is stable as well as a detailed review of systems today   COVID 19 VACCINATION STATUS: fully vaccinated [employer]), with booster 12/2019   BREAST CANCER HISTORY:  From the original intake note:  She herself palpated a mass in her right breast in 04/2008.  The patient tells me that she had lost approximately 100 pounds by diet and exercise over the past year, and that this made her breasts lumpier feeling.  In any case, she brought this question to Dr. Truddie Hidden Lavoie's attention, and was set up for mammography at [facility] on 04/21/2008.  She had had a mammogram in 09/2007, which had been unremarkable.   The mammogram on 05/01/2008 showed a dense right breast with a suggestion of a possible mass in the upper central breast corresponding to the palpable abnormality.  Ultrasound was more revealing, and did show an irregularly bordered hypoechoic solid mass measuring 1.2 cm.  The patient was brought back for biopsy and clip placement on 04/23, and the pathology from that procedure ([id number] and [id number]) showed an invasive ductal carcinoma which was negative for the estrogen and progesterone receptor at 0 and 0 respectively.  The Ki-67 was 19%, and CISH was amplified with a ratio of 2.22 (this is just above the "indeterminate" range).  Bilateral breast MRIs were obtained on 04/30.  This showed in the right breast an enhancing rounded mass measuring up to 1.8 cm.  The left breast was unremarkable, and there were no enlarged or suspicious internal mammary or axillary nodes on either side.  With this information, the patient proceeded to definitive right lumpectomy and sentinel lymph node sampling on 05/16/2008.  The final pathology ([id number]) confirmed a 1.5 cm invasive ductal carcinoma, grade 2, with negative margins, no evidence of lymphovascular invasion, and 0 of 3 sentinel lymph nodes involved.  The estrogen receptor test was repeated ([id number]), and was again negative with a strong positive control.  Her subsequent history is as detailed below.    PAST MEDICAL HISTORY: Past Medical History:  Diagnosis Date  . Breast cancer ([place])    right  . Chronic leg pain   . Endometriosis   . Family history of bladder cancer   . Family history of breast  cancer   . Leg cramps 11/19/2016  . Obesity   . Paresthesias    Generalized  . 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 Thrombocytopenia, secondary    secondary to valtrex  She had an episode of cholecystitis, and underwent cholecystectomy on 06/01/2008 under Dr. Dalbert Batman.  The pathology there ([id number]) showed only chronic cholecystitis and cholelithiasis  with a benign cystic duct lymph node. History of cold sores, and takes chronic suppressive therapy with Valtrex.      PAST SURGICAL HISTORY: Past Surgical History:  Procedure Laterality Date  . BREAST LUMPECTOMY Right 2010  . BREAST SURGERY  05/2008   lumpectomy - right  . CHOLECYSTECTOMY  05/2008  . COLONOSCOPY  08/2009  . HERNIA REPAIR  9458   umbilical  . HERNIA REPAIR  1996  . LAPAROSCOPY    . port a cath insertion  06/2008  . uterine polyp removal  06/2008    FAMILY HISTORY Family History  Problem Relation Age of Onset  . Heart disease Father        pacemaker----tachycardia  . Congestive Heart Failure Father 24  . Stroke Maternal Grandmother   . Bladder Cancer Maternal Grandmother        dx in her 67s  . Heart disease Maternal Grandfather 60       MI  . Stroke Maternal Grandfather   . Heart disease Paternal Grandfather 34       MI  . Hypertension Mother   . Breast cancer Other        MGF's sister  The patient's father died at the age of 70 from causes not quite clear to the patient, but he certainly had significant heart disease.  The patient's mother is alive.  She has a history of bladder cancer.  Both the patient's parents were single children.  The patient has one sister and two brothers.  There is no history of breast cancer in the immediate family.   GYNECOLOGIC HISTORY: Patient's last menstrual period was 05/13/2010. She is GX, P0. The patient tried for many years to get pregnant and took "large doses of fertility drugs" for about 8 or 9 years before giving up on those attempts.   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  Her husband, [name], is an Chief Financial Officer.  Their two children, [name] 17 and Alessandra Grout, were adopted from [place].  [name] is planning to attend [employer] beginning the fall of 2021 and to become a Conservation officer, historic buildings.  The younger son likes to fish.  The patient attends the [employer]    ADVANCED DIRECTIVES: In the absence of any documentation to the contrary, the patient's spouse is their HCPOA.    HEALTH MAINTENANCE: Social History   Tobacco Use  . Smoking status: Former Research scientist (life sciences)  . Smokeless tobacco: Never Used  Substance Use Topics  . Alcohol use: Not Currently    Comment: 0     Colonoscopy: 2011  PAP: UTD/ [facility]oie  Bone density:  Lipid panel:   Allergies  Allergen Reactions  . Clarithromycin     Thrush  . Other     Steroids---Eye pressure  . Penicillins Other (See Comments)    Child hood Did it involve swelling of the face/tongue/throat, SOB, or low BP? Unknown Did it involve sudden or severe rash/hives, skin peeling, or any reaction on the inside of your mouth or nose? Unknown Did you need to seek medical attention at a hospital  or doctor's office? Unknown When did it last happen? If all above answers are "NO", may proceed with cephalosporin use.      Current Outpatient Medications on File Prior to Visit  Medication Sig Dispense Refill  . B Complex Vitamins (B-COMPLEX/B-12 PO) Take 1 tablet by mouth daily.     . Biotin 1000 MCG tablet Take 1,000 mcg by mouth daily.     . cholecalciferol (VITAMIN D) 1000 UNITS tablet Take 2,000 Units by mouth daily.    . clotrimazole-betamethasone (LOTRISONE) cream Apply 1 application topically 2 (two) times daily. 30 g 0  . DULoxetine (CYMBALTA) 30 MG capsule TAKE ONE CAPSULE BY MOUTH EVERY DAY 90 capsule 3  . gabapentin (NEURONTIN) 300 MG capsule TAKE 3 CAPSULES BY MOUTH FOUR TIMES DAILY 1080 capsule 3  . hydroxypropyl methylcellulose / hypromellose (ISOPTO TEARS / GONIOVISC) 2.5 % ophthalmic solution Place 1 drop into both eyes 3 (three) times daily as needed for dry eyes.    . Multiple Vitamin (MULTIVITAMIN) tablet Take 1 tablet by mouth daily.    . valACYclovir (VALTREX) 500 MG tablet TAKE 1 TABLET BY MOUTH DAILY 90 tablet 0   No current facility-administered medications on  file prior to visit.    OBJECTIVE:  white woman who appears stated age  85:   04/18/20 1029  BP: 112/66  Pulse: 68  Resp: 17  Temp: (!) 97.3 F (36.3 C)  TempSrc: Tympanic  SpO2: 100%  Weight: 226 lb 1.6 oz (102.6 kg)  Height: 5' 7.5" (1.715 m)  Body mass index is 34.89 kg/m.  Sclerae unicteric, EOMs intact Wearing a mask No cervical or supraclavicular adenopathy Lungs no rales or rhonchi Heart regular rate and rhythm Abd soft, nontender, positive bowel sounds MSK no focal spinal tenderness, no upper extremity lymphedema Neuro: nonfocal, well oriented, appropriate affect Breasts: The right breast is status post lumpectomy and radiation.  Aside from the treatment-related changes there are no findings of concern.  The left breast and both axillae are benign.   LAB RESULTS: CBC    Component Value Date/Time   WBC 5.2 04/18/2020 0953   RBC 4.57 04/18/2020 0953   HGB 14.2 04/18/2020 0953   HGB 13.5 04/19/2019 0943   HGB 12.6 05/30/2016 1230   HCT 43.5 04/18/2020 0953   HCT 37.8 05/30/2016 1230   PLT 244 04/18/2020 0953   PLT 212 04/19/2019 0943   PLT 140 (L) 05/30/2016 1230   MCV 95.2 04/18/2020 0953   MCV 101.8 (H) 05/30/2016 1230   MCH 31.1 04/18/2020 0953   MCHC 32.6 04/18/2020 0953   RDW 12.1 04/18/2020 0953   RDW 12.8 05/30/2016 1230   LYMPHSABS 1.5 04/18/2020 0953   LYMPHSABS 1.3 05/30/2016 1230   MONOABS 0.4 04/18/2020 0953   MONOABS 0.2 05/30/2016 1230   EOSABS 0.2 04/18/2020 0953   EOSABS 0.1 05/30/2016 1230   BASOSABS 0.0 04/18/2020 0953   BASOSABS 0.0 05/30/2016 1230    CMP Latest Ref Rng & Units 04/18/2020 02/29/2020 04/19/2019  Glucose 70 - 99 mg/dL 100(H) 83 89  BUN 6 - 20 mg/dL _0 Creatinine 0.44 - 1.00 mg/dL 1.05(H) 0.97 0.95  Sodium 135 - 145 mmol/L 139 137 141  Potassium 3.5 - 5.1 mmol/L 4.6 4.2 4.4  Chloride 98 - 111 mmol/L 103 101 104  CO2 22 - 32 mmol/L _1 Calcium 8.9 - 10.3 mg/dL 9.5 9.9 9.5  Total Protein 6.5 - 8.1  g/dL 7.6 7.5 7.3  Total Bilirubin 0.3 -  1.2 mg/dL 0.5 0.5 0.4  Alkaline Phos 38 - 126 U/L 130(H) 113 130(H)  AST 15 - 41 U/L _0 ALT 0 - 44 U/L _1 STUDIES: ECHOCARDIOGRAM COMPLETE  Result Date: 03/29/2020    ECHOCARDIOGRAM REPORT   Patient Name:   HEAVENLY CHRISTINE Date of Exam: 03/29/2020 Medical Rec #:  [id number]   Height:       67.5 in Accession #:    [id number]  Weight:       230.2 lb Date of Birth:  09-19-1960   BSA:          2.159 m Patient Age:    59 years    BP:           108/70 mmHg Patient Gender: F           HR:           58 bpm. Exam Location:  [facility] Procedure: 2D Echo, Cardiac Doppler and Color Doppler Indications:    R07.9* Chest pain, unspecified  History:        Patient has no prior history of Echocardiogram examinations.                 Signs/Symptoms:Chest Pain; Risk Factors:Obesity.  Sonographer:    Geradine Girt Referring Phys: Sidney  Sonographer Comments: Patient is morbidly obese. Image acquisition challenging due to patient body habitus. IMPRESSIONS  1. Left ventricular ejection fraction, by estimation, is 55 to 60%. The left ventricle has normal function. The left ventricle has no regional wall motion abnormalities. There is moderate asymmetric left ventricular hypertrophy of the septal segment. Left ventricular diastolic parameters are indeterminate.  2. Right ventricular systolic function is normal. The right ventricular size is normal.  3. The mitral valve is normal in structure. No evidence of mitral valve regurgitation. No evidence of mitral stenosis.  4. The aortic valve is normal in structure. Aortic valve regurgitation is not visualized. No aortic stenosis is present.  5. The inferior vena cava is normal in size with greater than 50% respiratory variability, suggesting right atrial pressure of 3 mmHg.  6. Unable to determine pulmonary artery systolic pressure, No TR jet spectral display. FINDINGS  Left Ventricle: Left ventricular ejection  fraction, by estimation, is 55 to 60%. The left ventricle has normal function. The left ventricle has no regional wall motion abnormalities. The left ventricular internal cavity size was normal in size. There is  moderate asymmetric left ventricular hypertrophy of the septal segment. Left ventricular diastolic parameters are indeterminate. Right Ventricle: The right ventricular size is normal. No increase in right ventricular wall thickness. Right ventricular systolic function is normal. Left Atrium: Left atrial size was normal in size. Right Atrium: Right atrial size was normal in size. Pericardium: There is no evidence of pericardial effusion. Presence of pericardial fat pad. Mitral Valve: The mitral valve is normal in structure. No evidence of mitral valve regurgitation. No evidence of mitral valve stenosis. Tricuspid Valve: The tricuspid valve is normal in structure. Tricuspid valve regurgitation is not demonstrated. No evidence of tricuspid stenosis. Aortic Valve: The aortic valve is normal in structure. There is mild aortic valve annular calcification. Aortic valve regurgitation is not visualized. No aortic stenosis is present. Pulmonic Valve: The pulmonic valve was normal in structure. Pulmonic valve regurgitation is not visualized. No evidence of pulmonic stenosis. Aorta: The aortic root is normal in size and structure. Venous: The inferior vena cava is normal in size  with greater than 50% respiratory variability, suggesting right atrial pressure of 3 mmHg. IAS/Shunts: No atrial level shunt detected by color flow Doppler.  LEFT VENTRICLE PLAX 2D LVIDd:         3.90 cm  Diastology LVIDs:         2.56 cm  LV e' medial:    10.80 cm/s LV PW:         0.86 cm  LV E/e' medial:  7.9 LV IVS:        1.31 cm  LV e' lateral:   10.70 cm/s LVOT diam:     1.90 cm  LV E/e' lateral: 8.0 LV SV:         56 LV SV Index:   26 LVOT Area:     2.84 cm  RIGHT VENTRICLE RV S prime:     13.80 cm/s LEFT ATRIUM             Index        RIGHT ATRIUM           Index LA Vol (A2C):   27.6 ml 12.79 ml/m RA Area:     12.70 cm LA Vol (A4C):   17.2 ml 7.97 ml/m  RA Volume:   25.30 ml  11.72 ml/m LA Biplane Vol: 23.4 ml 10.84 ml/m  AORTIC VALVE LVOT Vmax:   90.70 cm/s LVOT Vmean:  49.300 cm/s LVOT VTI:    0.196 m  AORTA Ao Root diam: 2.60 cm Ao Asc diam:  2.90 cm MITRAL VALVE MV Area (PHT): 4.49 cm    SHUNTS MV Decel Time: 169 msec    Systemic VTI:  0.20 m MV E velocity: 85.70 cm/s  Systemic Diam: 1.90 cm MV A velocity: 75.40 cm/s MV E/A ratio:  1.14 Kardie Tobb DO Electronically signed by Berniece Salines DO Signature Date/Time: 03/29/2020/7:56:08 PM    Final     ASSESSMENT: 60 y.o.  BRCA negative [place] woman  (1) status post right upper inner quadrant lumpectomy and sentinel lymph node dissection May 2010 for a T1c N0, stage 1A invasive ductal carcinoma,  grade 2,  estrogen and progesterone receptor negative, but HER-2/neu positive.  The MIB-1 was 19%.   (2) received 4 cycles of docetaxel/carboplatin/trastuzumab adjuvantly  (3) she continued Herceptin to July 2010 with echocardiogram showing a well-preserved ejection fraction after the completion of treatment.  (4) she completed radiation in December 2010.   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 Sequencing was performed for select regions of POLE and POLD1, and large rearrangement analysis was  performed for select regions of GREM1.   (7) persistent mild transaminase elevation, with negative hepatitis and HIV serologies and no evidence of metastases by CT scan of the chest 02/28/2016  (a) hepatic steatosis noted on abdominal MRI/MRCP 10/21/2016  (8) iron deficiency, status post Feraheme 03/08/2016  (9) category C/D breast density  (a) breast MRI 08/12/2017 shows no evidence of disease  (b) mammography with tomography 03/20/2018 unremarkable  (c) bilateral  screening mammography 04/07/2019: density category C, no evidence of malignancy  (d) breast MRI September 2021 shows no evidence of disease   PLAN:  Mekaila is now 12 years out from definitive surgery for her breast cancer, with no evidence of disease recurrence.  This is very favorable.  In addition her breast density has decreased from the to see, making the need for intensive surveillance less acute.  I would be comfortable with MRIs every other year until her breast density drops to be which will eventually occur.  She is agreeable with this also.  Her next breast MRI accordingly will be in September 2023.  Unfortunately I will not be able to enter this order until September of this year as the electronic medical record would not allow orders beyond 1 year.  At this point however I am comfortable releasing her from follow-up here.  All she needs in terms of breast cancer follow-up is a yearly physician breast exam and her yearly mammogram and then we are doing breast MRIs every other year until her breast density decreases further.  I will make myself note to enter the MRI order for September 2023.  I will be glad to see Lovey Newcomer again at any point in the future if and when the need arises but as of now are making no further routine appointments for her here  Total encounter time 25 minutes.*  Shaquel Josephson, Virgie Dad, MD  04/18/20 10:41 AM Medical Oncology and Hematology [facility] [place],  [place] [address] Tel. [phone]    Fax. [phone]   I, Wilburn Mylar, am acting as scribe for Dr. Virgie Dad. Mikai Meints.  I, Lurline Del MD, have reviewed the above documentation for accuracy and completeness, and I agree with the above.   *Total Encounter Time as defined by the Centers for Medicare and Medicaid Services includes, in addition to the face-to-face time of a patient visit (documented in the note above) non-face-to-face time: obtaining and reviewing outside history, ordering and reviewing medications, tests or procedures, care coordination (communications with other health care professionals or caregivers) and documentation in the medical record.

## 2020-04-18 ENCOUNTER — Inpatient Hospital Stay (HOSPITAL_BASED_OUTPATIENT_CLINIC_OR_DEPARTMENT_OTHER): Payer: Managed Care, Other (non HMO) | Admitting: Oncology

## 2020-04-18 ENCOUNTER — Other Ambulatory Visit: Payer: Self-pay

## 2020-04-18 ENCOUNTER — Inpatient Hospital Stay: Payer: Managed Care, Other (non HMO) | Attending: Oncology

## 2020-04-18 VITALS — BP 112/66 | HR 68 | Temp 97.3°F | Resp 17 | Ht 67.5 in | Wt 226.1 lb

## 2020-04-18 DIAGNOSIS — Z923 Personal history of irradiation: Secondary | ICD-10-CM | POA: Diagnosis not present

## 2020-04-18 DIAGNOSIS — Z87891 Personal history of nicotine dependence: Secondary | ICD-10-CM | POA: Insufficient documentation

## 2020-04-18 DIAGNOSIS — G549 Nerve root and plexus disorder, unspecified: Secondary | ICD-10-CM

## 2020-04-18 DIAGNOSIS — R922 Inconclusive mammogram: Secondary | ICD-10-CM

## 2020-04-18 DIAGNOSIS — Z171 Estrogen receptor negative status [ER-]: Secondary | ICD-10-CM

## 2020-04-18 DIAGNOSIS — Z9221 Personal history of antineoplastic chemotherapy: Secondary | ICD-10-CM | POA: Insufficient documentation

## 2020-04-18 DIAGNOSIS — Z853 Personal history of malignant neoplasm of breast: Secondary | ICD-10-CM | POA: Diagnosis present

## 2020-04-18 DIAGNOSIS — C50211 Malignant neoplasm of upper-inner quadrant of right female breast: Secondary | ICD-10-CM | POA: Diagnosis not present

## 2020-04-18 LAB — CBC WITH DIFFERENTIAL/PLATELET
Abs Immature Granulocytes: 0.01 10*3/uL (ref 0.00–0.07)
Basophils Absolute: 0 10*3/uL (ref 0.0–0.1)
Basophils Relative: 1 %
Eosinophils Absolute: 0.2 10*3/uL (ref 0.0–0.5)
Eosinophils Relative: 3 %
HCT: 43.5 % (ref 36.0–46.0)
Hemoglobin: 14.2 g/dL (ref 12.0–15.0)
Immature Granulocytes: 0 %
Lymphocytes Relative: 29 %
Lymphs Abs: 1.5 10*3/uL (ref 0.7–4.0)
MCH: 31.1 pg (ref 26.0–34.0)
MCHC: 32.6 g/dL (ref 30.0–36.0)
MCV: 95.2 fL (ref 80.0–100.0)
Monocytes Absolute: 0.4 10*3/uL (ref 0.1–1.0)
Monocytes Relative: 7 %
Neutro Abs: 3.2 10*3/uL (ref 1.7–7.7)
Neutrophils Relative %: 60 %
Platelets: 244 10*3/uL (ref 150–400)
RBC: 4.57 MIL/uL (ref 3.87–5.11)
RDW: 12.1 % (ref 11.5–15.5)
WBC: 5.2 10*3/uL (ref 4.0–10.5)
nRBC: 0 % (ref 0.0–0.2)

## 2020-04-18 LAB — COMPREHENSIVE METABOLIC PANEL
ALT: 18 U/L (ref 0–44)
AST: 22 U/L (ref 15–41)
Albumin: 4 g/dL (ref 3.5–5.0)
Alkaline Phosphatase: 130 U/L — ABNORMAL HIGH (ref 38–126)
Anion gap: 9 (ref 5–15)
BUN: 10 mg/dL (ref 6–20)
CO2: 27 mmol/L (ref 22–32)
Calcium: 9.5 mg/dL (ref 8.9–10.3)
Chloride: 103 mmol/L (ref 98–111)
Creatinine, Ser: 1.05 mg/dL — ABNORMAL HIGH (ref 0.44–1.00)
GFR, Estimated: >60 mL/min (ref >60)
Glucose, Bld: 100 mg/dL — ABNORMAL HIGH (ref 70–99)
Potassium: 4.6 mmol/L (ref 3.5–5.1)
Sodium: 139 mmol/L (ref 135–145)
Total Bilirubin: 0.5 mg/dL (ref 0.3–1.2)
Total Protein: 7.6 g/dL (ref 6.5–8.1)

## 2020-04-19 ENCOUNTER — Ambulatory Visit (INDEPENDENT_AMBULATORY_CARE_PROVIDER_SITE_OTHER): Payer: Managed Care, Other (non HMO) | Admitting: Neurology

## 2020-04-19 ENCOUNTER — Encounter: Payer: Self-pay | Admitting: Neurology

## 2020-04-19 VITALS — BP 119/80 | HR 73 | Ht 67.5 in | Wt 227.2 lb

## 2020-04-19 DIAGNOSIS — G549 Nerve root and plexus disorder, unspecified: Secondary | ICD-10-CM

## 2020-04-19 MED ORDER — DULOXETINE HCL 30 MG PO CPEP: ORAL_CAPSULE | ORAL | 3 refills | Status: DC

## 2020-04-19 MED ORDER — GABAPENTIN 300 MG PO CAPS: ORAL_CAPSULE | ORAL | 3 refills | Status: DC

## 2020-04-19 NOTE — Progress Notes (Signed)
Reason for visit: Sensory neuropathy  Jeanette Boone is an 60 y.o. 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  The patient has had chronic symptoms, she has been stable since last seen 1 year ago.  She remains on high-dose gabapentin taking 900 mg 4 times daily, she is on 30 mg of Cymbalta.  The patient denies any significant balance issues, she indicates that caffeine in her diet may worsen her symptoms.  She does not always sleep well but it is oftentimes not related to the neuropathy.  The patient returns for further evaluation.  She is able to continue working on a regular basis.  Past Medical History:  Diagnosis Date  . Breast cancer ([place])    right  . Chronic leg pain   . Endometriosis   . Family history of bladder cancer   . Family history of breast cancer   . Leg cramps 11/19/2016  . Obesity   . Paresthesias    Generalized  . 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 Thrombocytopenia, secondary    secondary to valtrex    Past Surgical History:  Procedure Laterality Date  . BREAST LUMPECTOMY Right 2010  . BREAST SURGERY  05/2008   lumpectomy - right  . CHOLECYSTECTOMY  05/2008  . COLONOSCOPY  08/2009  . HERNIA REPAIR  2130   umbilical  . HERNIA REPAIR  1996  . LAPAROSCOPY    . port a cath insertion  06/2008  . uterine polyp removal  06/2008    Family History  Problem Relation Age of Onset  . Heart disease Father        pacemaker----tachycardia  . Congestive Heart Failure Father 32  . Stroke Maternal Grandmother   . Bladder Cancer Maternal Grandmother        dx in her 25s  . Heart disease Maternal Grandfather 36       MI  . Stroke Maternal Grandfather   . Heart disease Paternal Grandfather 36       MI  . Hypertension Mother   . Breast cancer Other        MGF's sister    Social history:  reports that she has quit smoking.  She has never used smokeless tobacco. She reports previous alcohol use. She reports that she does not use drugs.    Allergies  Allergen Reactions  . Clarithromycin     Thrush  . Other     Steroids---Eye pressure  . Penicillins Other (See Comments)    Child hood Did it involve swelling of the face/tongue/throat, SOB, or low BP? Unknown Did it involve sudden or severe rash/hives, skin peeling, or any reaction on the inside of your mouth or nose? Unknown Did you need to seek medical attention at a hospital or doctor's office? Unknown When did it last happen? If all above answers are "NO", may proceed with cephalosporin use.      Medications:  Prior to Admission medications   Medication Sig Start Date End Date Taking? Authorizing Provider  B Complex Vitamins (B-COMPLEX/B-12 PO) Take 1 tablet by mouth daily.    Yes [provider]  Biotin 1000 MCG tablet Take 1,000 mcg by mouth daily.    Yes [provider]  cholecalciferol (VITAMIN D) 1000 UNITS tablet Take 2,000 Units by mouth daily.   Yes [provider]  DULoxetine (CYMBALTA) 30 MG capsule  TAKE ONE CAPSULE BY MOUTH EVERY DAY 04/19/19  Yes Kathrynn Ducking, MD  gabapentin (NEURONTIN) 300 MG capsule TAKE 3 CAPSULES BY MOUTH FOUR TIMES DAILY 04/19/19  Yes Kathrynn Ducking, MD  hydroxypropyl methylcellulose / hypromellose (ISOPTO TEARS / GONIOVISC) 2.5 % ophthalmic solution Place 1 drop into both eyes 3 (three) times daily as needed for dry eyes.   Yes [provider]  Multiple Vitamin (MULTIVITAMIN) tablet Take 1 tablet by mouth daily.   Yes [provider]  valACYclovir (VALTREX) 500 MG tablet TAKE 1 TABLET BY MOUTH DAILY 01/31/20  Yes Princess Bruins, MD  clotrimazole-betamethasone (LOTRISONE) cream Apply 1 application topically 2 (two) times daily. 02/29/20   Ann Held, DO    ROS:  Out of a complete 14 system review of symptoms, the patient complains only of the  following symptoms, and all other reviewed systems are negative.  Foot discomfort, numbness Insomnia  Blood pressure 119/80, pulse 73, height 5' 7.5" (1.715 m), weight 227 lb 3.2 oz (103.1 kg), last menstrual period 05/13/2010.  Physical Exam  General: The patient is alert and cooperative at the time of the examination.  The patient is moderately obese.  Skin: No significant peripheral edema is noted.   Neurologic Exam  Mental status: The patient is alert and oriented x 3 at the time of the examination. The patient has apparent normal recent and remote memory, with an apparently normal attention span and concentration ability.   Cranial nerves: Facial symmetry is present. Speech is normal, no aphasia or dysarthria is noted. Extraocular movements are full. Visual fields are full.  Motor: The patient has good strength in all 4 extremities.  Sensory examination: Soft touch sensation is symmetric on the face, arms, and legs.  Coordination: The patient has good finger-nose-finger and heel-to-shin bilaterally.  Gait and station: The patient has a normal gait. Tandem gait is very minimally unsteady. Romberg is negative. No drift is seen.  Reflexes: Deep tendon reflexes are symmetric.   Assessment/Plan:  1.  Sensory neuropathy  2.  History of breast cancer  The patient will remain on her current dose of medications, she can go up on the Cymbalta if needed but she wishes to stay on her current regimen.  The patient will follow up in 1 year, sooner if needed.  Will recommend transfer of care to Dr. Lavell Anchors once I retire.  Jill Alexanders MD 04/19/2020 7:56 AM  [facility] [address] [place], [place] [address]  Phone [phone] Fax [phone]

## 2020-04-30 ENCOUNTER — Other Ambulatory Visit: Payer: Self-pay | Admitting: Obstetrics & Gynecology

## 2020-05-01 ENCOUNTER — Telehealth: Payer: Self-pay | Admitting: Neurology

## 2020-05-01 NOTE — Telephone Encounter (Signed)
Medication refill request: valtrex 500mg  Last AEX:  07-21-2018 Next AEX: 07-19-2020 Last MMG (if hormonal medication request): n/a Refill authorized: please approve if appropriate

## 2020-05-01 NOTE — Telephone Encounter (Signed)
Pt states her insurance no longer covers the brand name   DULoxetine (CYMBALTA) 30 MG capsule, pt is asking for a call in for just the generic

## 2020-05-02 MED ORDER — DULOXETINE HCL 30 MG PO CPEP: ORAL_CAPSULE | ORAL | 3 refills | Status: DC

## 2020-05-02 NOTE — Telephone Encounter (Signed)
I will send in a prescription for the generic duloxetine.

## 2020-05-02 NOTE — Addendum Note (Signed)
Addended by: Kathrynn Ducking on: 05/02/2020 08:55 AM   Modules accepted: Orders

## 2020-06-15 ENCOUNTER — Encounter: Payer: Self-pay | Admitting: Oncology

## 2020-06-15 ENCOUNTER — Other Ambulatory Visit: Payer: Self-pay

## 2020-06-15 ENCOUNTER — Ambulatory Visit
Admission: RE | Admit: 2020-06-15 | Discharge: 2020-06-15 | Disposition: A | Discharge status: Home / Self Care | Payer: Managed Care, Other (non HMO) | Source: Ambulatory Visit | Attending: Oncology | Admitting: Oncology

## 2020-06-15 DIAGNOSIS — Z171 Estrogen receptor negative status [ER-]: Secondary | ICD-10-CM

## 2020-07-12 ENCOUNTER — Ambulatory Visit: Payer: Managed Care, Other (non HMO) | Admitting: Obstetrics & Gynecology

## 2020-07-13 ENCOUNTER — Ambulatory Visit: Payer: Managed Care, Other (non HMO) | Admitting: Obstetrics & Gynecology

## 2020-07-19 ENCOUNTER — Ambulatory Visit: Payer: Managed Care, Other (non HMO) | Admitting: Obstetrics & Gynecology

## 2020-07-31 ENCOUNTER — Other Ambulatory Visit: Payer: Self-pay | Admitting: Obstetrics & Gynecology

## 2020-08-01 NOTE — Telephone Encounter (Signed)
Annual exam scheduled on 10/13/20

## 2020-08-31 ENCOUNTER — Ambulatory Visit (INDEPENDENT_AMBULATORY_CARE_PROVIDER_SITE_OTHER): Payer: Managed Care, Other (non HMO) | Admitting: Family Medicine

## 2020-08-31 ENCOUNTER — Encounter: Payer: Self-pay | Admitting: Family Medicine

## 2020-08-31 ENCOUNTER — Other Ambulatory Visit: Payer: Self-pay

## 2020-08-31 VITALS — BP 100/80 | HR 60 | Temp 97.5°F | Resp 18 | Ht 67.5 in | Wt 203.6 lb

## 2020-08-31 DIAGNOSIS — E785 Hyperlipidemia, unspecified: Secondary | ICD-10-CM

## 2020-08-31 LAB — COMPREHENSIVE METABOLIC PANEL
ALT: 27 U/L (ref 0–35)
AST: 29 U/L (ref 0–37)
Albumin: 4.1 g/dL (ref 3.5–5.2)
Alkaline Phosphatase: 115 U/L (ref 39–117)
BUN: 12 mg/dL (ref 6–23)
CO2: 31 mEq/L (ref 19–32)
Calcium: 9.9 mg/dL (ref 8.4–10.5)
Chloride: 104 mEq/L (ref 96–112)
Creatinine, Ser: 1.01 mg/dL (ref 0.40–1.20)
GFR: 61 mL/min (ref >60.00)
Glucose, Bld: 81 mg/dL (ref 70–99)
Potassium: 4.6 mEq/L (ref 3.5–5.1)
Sodium: 140 mEq/L (ref 135–145)
Total Bilirubin: 0.7 mg/dL (ref 0.2–1.2)
Total Protein: 6.7 g/dL (ref 6.0–8.3)

## 2020-08-31 LAB — LIPID PANEL
Cholesterol: 211 mg/dL — ABNORMAL HIGH (ref 0–200)
HDL: 61.7 mg/dL (ref >39.00)
LDL Cholesterol: 132 mg/dL — ABNORMAL HIGH (ref 0–99)
NonHDL: 148.82
Total CHOL/HDL Ratio: 3
Triglycerides: 82 mg/dL (ref 0.0–149.0)
VLDL: 16.4 mg/dL (ref 0.0–40.0)

## 2020-08-31 NOTE — Assessment & Plan Note (Signed)
Encourage heart healthy diet such as MIND or DASH diet, increase exercise, avoid trans fats, simple carbohydrates and processed foods, consider a krill or fish or flaxseed oil cap daily.  °

## 2020-08-31 NOTE — Patient Instructions (Signed)
High Cholesterol  High cholesterol is a condition in which the blood has high levels of a white, waxy substance similar to fat (cholesterol). The liver makes all the cholesterol that the body needs. The human body needs small amounts of cholesterol to help build cells. A person gets extra orexcess cholesterol from the food that he or she eats. The blood carries cholesterol from the liver to the rest of the body. If you have high cholesterol, deposits (plaques) may build up on the walls of your arteries. Arteries are the blood vessels that carry blood away from your heart. These plaques make the arteries narrowand stiff. Cholesterol plaques increase your risk for heart attack and stroke. Work withyour health care provider to keep your cholesterol levels in a healthy range. What increases the risk? The following factors may make you more likely to develop this condition: Eating foods that are high in animal fat (saturated fat) or cholesterol. Being overweight. Not getting enough exercise. A family history of high cholesterol (familial hypercholesterolemia). Use of tobacco products. Having diabetes. What are the signs or symptoms? There are no symptoms of this condition. How is this diagnosed? This condition may be diagnosed based on the results of a blood test. If you are older than 59 years of age, your health care provider may check your cholesterol levels every 4-6 years. You may be checked more often if you have high cholesterol or other risk factors for heart disease. The blood test for cholesterol measures: "Bad" cholesterol, or LDL cholesterol. This is the main type of cholesterol that causes heart disease. The desired level is less than 100 mg/dL. "Good" cholesterol, or HDL cholesterol. HDL helps protect against heart disease by cleaning the arteries and carrying the LDL to the liver for processing. The desired level for HDL is 60 mg/dL or higher. Triglycerides. These are fats that your  body can store or burn for energy. The desired level is less than 150 mg/dL. Total cholesterol. This measures the total amount of cholesterol in your blood and includes LDL, HDL, and triglycerides. The desired level is less than 200 mg/dL. How is this treated? This condition may be treated with: Diet changes. You may be asked to eat foods that have more fiber and less saturated fats or added sugar. Lifestyle changes. These may include regular exercise, maintaining a healthy weight, and quitting use of tobacco products. Medicines. These are given when diet and lifestyle changes have not worked. You may be prescribed a statin medicine to help lower your cholesterol levels. Follow these instructions at home: Eating and drinking  Eat a healthy, balanced diet. This diet includes: Daily servings of a variety of fresh, frozen, or canned fruits and vegetables. Daily servings of whole grain foods that are rich in fiber. Foods that are low in saturated fats and trans fats. These include poultry and fish without skin, lean cuts of meat, and low-fat dairy products. A variety of fish, especially oily fish that contain omega-3 fatty acids. Aim to eat fish at least 2 times a week. Avoid foods and drinks that have added sugar. Use healthy cooking methods, such as roasting, grilling, broiling, baking, poaching, steaming, and stir-frying. Do not fry your food except for stir-frying.  Lifestyle  Get regular exercise. Aim to exercise for a total of 150 minutes a week. Increase your activity level by doing activities such as gardening, walking, and taking the stairs. Do not use any products that contain nicotine or tobacco, such as cigarettes, e-cigarettes, and chewing tobacco.   If you need help quitting, ask your health care provider.  General instructions Take over-the-counter and prescription medicines only as told by your health care provider. Keep all follow-up visits as told by your health care provider.  This is important. Where to find more information American Heart Association: www.heart.org National Heart, Lung, and Blood Institute: www.nhlbi.nih.gov Contact a health care provider if: You have trouble achieving or maintaining a healthy diet or weight. You are starting an exercise program. You are unable to stop smoking. Get help right away if: You have chest pain. You have trouble breathing. You have any symptoms of a stroke. "BE FAST" is an easy way to remember the main warning signs of a stroke: B - Balance. Signs are dizziness, sudden trouble walking, or loss of balance. E - Eyes. Signs are trouble seeing or a sudden change in vision. F - Face. Signs are sudden weakness or numbness of the face, or the face or eyelid drooping on one side. A - Arms. Signs are weakness or numbness in an arm. This happens suddenly and usually on one side of the body. S - Speech. Signs are sudden trouble speaking, slurred speech, or trouble understanding what people say. T - Time. Time to call emergency services. Write down what time symptoms started. You have other signs of a stroke, such as: A sudden, severe headache with no known cause. Nausea or vomiting. Seizure. These symptoms may represent a serious problem that is an emergency. Do not wait to see if the symptoms will go away. Get medical help right away. Call your local emergency services (911 in the [place] Do not drive yourself to the hospital. Summary Cholesterol plaques increase your risk for heart attack and stroke. Work with your health care provider to keep your cholesterol levels in a healthy range. Eat a healthy, balanced diet, get regular exercise, and maintain a healthy weight. Do not use any products that contain nicotine or tobacco, such as cigarettes, e-cigarettes, and chewing tobacco. Get help right away if you have any symptoms of a stroke. This information is not intended to replace advice given to you by your health care  provider. Make sure you discuss any questions you have with your healthcare provider. Document Revised: 11/23/2018 Document Reviewed: 11/23/2018 Elsevier Patient Education  2022 Elsevier Inc.  

## 2020-08-31 NOTE — Progress Notes (Addendum)
5  Subjective:   By signing my name below, I, Shehryar Baig, attest that this documentation has been prepared under the direction and in the presence of Dr. Roma Schanz, DO. 08/31/2020    Patient ID: Jeanette Boone, female    DOB: 10-25-1960, 60 y.o.   MRN: [id number]  Chief Complaint  Patient presents with   Hyperlipidemia   Follow-up    HPI Patient is in today for a office visit.  She is getting her cholesterol levels checked during her next lab work.  She is f/u on cholesterol -- -pt has been losing weight and is anxious to see her numbers.    Lab Results  Component Value Date   CHOL 225 (H) 02/29/2020   HDL 77.80 02/29/2020   LDLCALC 133 (H) 02/29/2020   LDLDIRECT 140.9 12/18/2011   TRIG 70.0 02/29/2020   CHOLHDL 3 02/29/2020   Her blood sugar was doing well during her last blood screening.   Lab Results  Component Value Date   HGBA1C 5.2 02/23/2018   She reports maintaining a healthy diet and regular exercise.  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   Thrombocytopenia, secondary    secondary to valtrex    Past Surgical History:  Procedure Laterality Date   BREAST LUMPECTOMY Right 2010   BREAST SURGERY  05/2008   lumpectomy - right   CHOLECYSTECTOMY  05/2008   COLONOSCOPY  08/2009   HERNIA REPAIR  8502   umbilical   HERNIA REPAIR  1996   LAPAROSCOPY     port a cath insertion  06/2008   uterine polyp removal  06/2008    Family History  Problem Relation Age of Onset   Heart disease Father        pacemaker----tachycardia   Congestive Heart Failure Father 9    Stroke Maternal Grandmother    Bladder Cancer Maternal Grandmother        dx in her 75s   Heart disease Maternal Grandfather 8       MI   Stroke Maternal Grandfather    Heart disease Paternal Grandfather 53       MI   Hypertension Mother    Breast cancer Other        MGF's sister    Social History   Socioeconomic History   Marital status: Married    Spouse name: donald   Number of children: 0   Years of education: college   Highest education level: Not on file  Occupational History   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    Employer: [employer]    Comment: Full Time  Tobacco Use   Smoking status: Former   Smokeless tobacco: Never  Scientific laboratory technician Use: Never used  Substance and Sexual Activity   Alcohol use: Not Currently    Comment: 0   Drug use: Never   Sexual activity: Yes    Partners: Male    Comment: refused to answer insurance questions  Other  Topics Concern   Not on file  Social History Narrative   Lives with husband and son   Patient is right handed.   Patient drinks 4-5 cups caffeine daily   Social Determinants of Health   Financial Resource Strain: Not on file  Food Insecurity: Not on file  Transportation Needs: Not on file  Physical Activity: Not on file  Stress: Not on file  Social Connections: Not on file  Intimate Partner Violence: Not on file    Outpatient Medications Prior to Visit  Medication Sig Dispense Refill   B Complex Vitamins (B-COMPLEX/B-12 PO) Take 1 tablet by mouth daily.      Biotin 1000 MCG tablet Take 1,000 mcg by mouth daily.      cholecalciferol (VITAMIN D) 1000 UNITS tablet Take 2,000 Units by mouth daily.     DULoxetine (CYMBALTA) 30 MG capsule TAKE ONE CAPSULE BY MOUTH EVERY DAY 90 capsule 3   gabapentin (NEURONTIN) 300 MG capsule TAKE 3 CAPSULES BY MOUTH FOUR TIMES DAILY 1080 capsule 3   hydroxypropyl methylcellulose / hypromellose (ISOPTO TEARS / GONIOVISC) 2.5 % ophthalmic solution Place 1 drop into both eyes  3 (three) times daily as needed for dry eyes.     Multiple Vitamin (MULTIVITAMIN) tablet Take 1 tablet by mouth daily.     valACYclovir (VALTREX) 500 MG tablet TAKE 1 TABLET BY MOUTH DAILY 90 tablet 0   No facility-administered medications prior to visit.    Allergies  Allergen Reactions   Clarithromycin     Thrush   Other     Steroids---Eye pressure   Penicillins Other (See Comments)    Child hood Did it involve swelling of the face/tongue/throat, SOB, or low BP? Unknown Did it involve sudden or severe rash/hives, skin peeling, or any reaction on the inside of your mouth or nose? Unknown Did you need to seek medical attention at a hospital or doctor's office? Unknown When did it last happen?       If all above answers are "NO", may proceed with cephalosporin use.      Review of Systems  Constitutional:  Negative for chills, fever and malaise/fatigue.  HENT:  Negative for congestion and hearing loss.   Eyes:  Negative for blurred vision and discharge.  Respiratory:  Negative for cough, sputum production and shortness of breath.   Cardiovascular:  Negative for chest pain, palpitations and leg swelling.  Gastrointestinal:  Negative for abdominal pain, blood in stool, constipation, diarrhea, heartburn, nausea and vomiting.  Genitourinary:  Negative for dysuria, frequency, hematuria and urgency.  Musculoskeletal:  Negative for back pain, falls and myalgias.  Skin:  Negative for rash.  Neurological:  Negative for dizziness, sensory change, loss of consciousness, weakness and headaches.  Endo/Heme/Allergies:  Negative for environmental allergies. Does not bruise/bleed easily.  Psychiatric/Behavioral:  Negative for depression and suicidal ideas. The patient is not nervous/anxious and does not have insomnia.       Objective:    Physical Exam Vitals and nursing note reviewed.  Constitutional:      General: She is not in acute distress.    Appearance: Normal appearance. She is not  ill-appearing.  HENT:     Head: Normocephalic and atraumatic.     Right Ear: External ear normal.     Left Ear: External ear normal.  Eyes:     Extraocular Movements: Extraocular movements intact.     Pupils: Pupils are equal, round, and reactive to light.  Cardiovascular:     Rate and Rhythm:  Normal rate and regular rhythm.     Heart sounds: Normal heart sounds. No murmur heard.   No gallop.  Pulmonary:     Effort: Pulmonary effort is normal. No respiratory distress.     Breath sounds: Normal breath sounds. No wheezing or rales.  Skin:    General: Skin is warm and dry.  Neurological:     Mental Status: She is alert and oriented to person, place, and time.  Psychiatric:        Behavior: Behavior normal.        Judgment: Judgment normal.    BP 100/80 (BP Location: Left Arm, Patient Position: Sitting, Cuff Size: Large)   Pulse 60   Temp (!) 97.5 F (36.4 C) (Oral)   Resp 18   Ht 5' 7.5" (1.715 m)   Wt 203 lb 9.6 oz (92.4 kg)   LMP 05/13/2010   SpO2 97%   BMI 31.42 kg/m  Wt Readings from Last 3 Encounters:  08/31/20 203 lb 9.6 oz (92.4 kg)  04/19/20 227 lb 3.2 oz (103.1 kg)  04/18/20 226 lb 1.6 oz (102.6 kg)    Diabetic Foot Exam - Simple   No data filed    Lab Results  Component Value Date   WBC 5.2 04/18/2020   HGB 14.2 04/18/2020   HCT 43.5 04/18/2020   PLT 244 04/18/2020   GLUCOSE 100 (H) 04/18/2020   CHOL 225 (H) 02/29/2020   TRIG 70.0 02/29/2020   HDL 77.80 02/29/2020   LDLDIRECT 140.9 12/18/2011   LDLCALC 133 (H) 02/29/2020   ALT 18 04/18/2020   AST 22 04/18/2020   NA 139 04/18/2020   K 4.6 04/18/2020   CL 103 04/18/2020   CREATININE 1.05 (H) 04/18/2020   BUN 10 04/18/2020   CO2 27 04/18/2020   TSH 2.32 02/29/2020   HGBA1C 5.2 02/23/2018    Lab Results  Component Value Date   TSH 2.32 02/29/2020   Lab Results  Component Value Date   WBC 5.2 04/18/2020   HGB 14.2 04/18/2020   HCT 43.5 04/18/2020   MCV 95.2 04/18/2020   PLT 244  04/18/2020   Lab Results  Component Value Date   NA 139 04/18/2020   K 4.6 04/18/2020   CHLORIDE 102 05/30/2016   CO2 27 04/18/2020   GLUCOSE 100 (H) 04/18/2020   BUN 10 04/18/2020   CREATININE 1.05 (H) 04/18/2020   BILITOT 0.5 04/18/2020   ALKPHOS 130 (H) 04/18/2020   AST 22 04/18/2020   ALT 18 04/18/2020   PROT 7.6 04/18/2020   ALBUMIN 4.0 04/18/2020   CALCIUM 9.5 04/18/2020   ANIONGAP 9 04/18/2020   EGFR 71 (L) 05/30/2016   GFR 64.26 02/29/2020   Lab Results  Component Value Date   CHOL 225 (H) 02/29/2020   Lab Results  Component Value Date   HDL 77.80 02/29/2020   Lab Results  Component Value Date   LDLCALC 133 (H) 02/29/2020   Lab Results  Component Value Date   TRIG 70.0 02/29/2020   Lab Results  Component Value Date   CHOLHDL 3 02/29/2020   Lab Results  Component Value Date   HGBA1C 5.2 02/23/2018       Assessment & Plan:   Problem List Items Addressed This Visit       Unprioritized   Hyperlipidemia - Primary    Encourage heart healthy diet such as MIND or DASH diet, increase exercise, avoid trans fats, simple carbohydrates and processed foods, consider a krill or fish or  flaxseed oil cap daily.       Relevant Orders   Lipid panel   Comprehensive metabolic panel     No orders of the defined types were placed in this encounter.   I, Dr. Roma Schanz, DO, personally preformed the services described in this documentation.  All medical record entries made by the scribe were at my direction and in my presence.  I have reviewed the chart and discharge instructions (if applicable) and agree that the record reflects my personal performance and is accurate and complete. 08/31/2020   I,Shehryar Baig,acting as a scribe for Ann Held, DO.,have documented all relevant documentation on the behalf of Ann Held, DO,as directed by  Ann Held, DO while in the presence of Ann Held, DO.   Ann Held, DO

## 2020-10-13 ENCOUNTER — Ambulatory Visit: Payer: Managed Care, Other (non HMO) | Admitting: Obstetrics & Gynecology

## 2020-10-31 ENCOUNTER — Other Ambulatory Visit: Payer: Self-pay | Admitting: Obstetrics & Gynecology

## 2020-11-01 NOTE — Telephone Encounter (Signed)
Annual exam scheduled on 12/18/20

## 2020-12-18 ENCOUNTER — Other Ambulatory Visit (HOSPITAL_COMMUNITY)
Admission: RE | Admit: 2020-12-18 | Discharge: 2020-12-18 | Disposition: A | Discharge status: Home / Self Care | Payer: Managed Care, Other (non HMO) | Source: Ambulatory Visit | Attending: Obstetrics & Gynecology | Admitting: Obstetrics & Gynecology

## 2020-12-18 ENCOUNTER — Ambulatory Visit (INDEPENDENT_AMBULATORY_CARE_PROVIDER_SITE_OTHER): Payer: Managed Care, Other (non HMO) | Admitting: Obstetrics & Gynecology

## 2020-12-18 ENCOUNTER — Encounter: Payer: Self-pay | Admitting: Obstetrics & Gynecology

## 2020-12-18 ENCOUNTER — Other Ambulatory Visit: Payer: Self-pay

## 2020-12-18 VITALS — BP 108/72 | HR 58 | Ht 67.0 in | Wt 173.0 lb

## 2020-12-18 DIAGNOSIS — Z780 Asymptomatic menopausal state: Secondary | ICD-10-CM | POA: Diagnosis not present

## 2020-12-18 DIAGNOSIS — C50211 Malignant neoplasm of upper-inner quadrant of right female breast: Secondary | ICD-10-CM | POA: Diagnosis not present

## 2020-12-18 DIAGNOSIS — Z01419 Encounter for gynecological examination (general) (routine) without abnormal findings: Secondary | ICD-10-CM | POA: Insufficient documentation

## 2020-12-18 DIAGNOSIS — B009 Herpesviral infection, unspecified: Secondary | ICD-10-CM

## 2020-12-18 DIAGNOSIS — Z171 Estrogen receptor negative status [ER-]: Secondary | ICD-10-CM

## 2020-12-18 MED ORDER — VALACYCLOVIR HCL 500 MG PO TABS: 500.0000 mg | ORAL_TABLET | Freq: Every day | ORAL | 4 refills | Status: DC

## 2020-12-18 NOTE — Progress Notes (Addendum)
Jeanette Boone Aug 08, 1960 [id number]   History:    60 y.o. G1P0A1 Married.  Facilities manager.  2 adopted children doing well, 39 and 47 yo.   RP:  Established patient presenting for annual gyn exam    HPI:  Postmenopausal. Experiencing hot flushes/night sweats, no HRT with H/O Rt Breast Ca.  No PMB.  No pelvic pain.  No pain with IC.  Last Pap Neg in 07/2018.  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  Discharged from Oncology.  MRI recommended q2 yrs, first in 2023 per Dr Jana Hakim.  Breasts wnl currently, but paraneoplastic neurologic disorder under Rx.  Mammo Neg 06/2020.  BMI 27.1. Good fitness and nutrition. On Valacyclovir for oral HSV.  Overdue for Colonoscopy, will schedule for 2023.  BD normal in 2018.  Will repeat at next Annual Gyn exam. Health labs with Fam MD.   Past medical history,surgical history, family history and social history were all reviewed and documented in the EPIC chart.  Gynecologic History Patient's last menstrual period was 05/13/2010.  Obstetric History OB History  Gravida Para Term Preterm AB Living  1 0     1 0  SAB IAB Ectopic Multiple Live Births  1            # Outcome Date GA Lbr Len/2nd Weight Sex Delivery Anes PTL Lv  1 SAB              ROS: A ROS was performed and pertinent positives and negatives are included in the history.  GENERAL: No fevers or chills. HEENT: No change in vision, no earache, sore throat or sinus congestion. NECK: No pain or stiffness. CARDIOVASCULAR: No chest pain or pressure. No palpitations. PULMONARY: No shortness of breath, cough or wheeze. GASTROINTESTINAL: No abdominal pain, nausea, vomiting or diarrhea, melena or bright red blood per rectum. GENITOURINARY: No urinary frequency, urgency, hesitancy or dysuria. MUSCULOSKELETAL: No joint or muscle pain, no back pain, no  recent trauma. DERMATOLOGIC: No rash, no itching, no lesions. ENDOCRINE: No polyuria, polydipsia, no heat or cold intolerance. No recent change in weight. HEMATOLOGICAL: No anemia or easy bruising or bleeding. NEUROLOGIC: No headache, seizures, numbness, tingling or weakness. PSYCHIATRIC: No depression, no loss of interest in normal activity or change in sleep pattern.     Exam:   BP 108/72   Pulse (!) 58   Ht 5\' 7"  (1.702 m)   Wt 173 lb (78.5 kg)   LMP 05/13/2010   SpO2 98%   BMI 27.10 kg/m   Body mass index is 27.1 kg/m.  General appearance : Well developed well nourished female. No acute distress HEENT: Eyes: no retinal hemorrhage or exudates,  Neck supple, trachea midline, no carotid bruits, no thyroidmegaly Lungs: Clear to auscultation, no rhonchi or wheezes, or rib retractions  Heart: Regular rate and rhythm, no murmurs or gallops Breast:Examined in sitting and supine position were symmetrical in appearance, no palpable masses or tenderness,  no skin retraction, no nipple inversion, no nipple discharge, no skin discoloration, no axillary or supraclavicular lymphadenopathy Abdomen: no palpable masses or tenderness, no rebound or guarding Extremities: no edema or skin discoloration or tenderness  Pelvic: Vulva: Normal             Vagina: No gross lesions or discharge  Cervix: No gross lesions or discharge.  Pap reflex done.  Uterus  AV, normal size, shape and consistency, non-tender and mobile  Adnexa  Without masses or tenderness  Anus: Normal   Assessment/Plan:  60 y.o. female for annual exam   1. Encounter for routine gynecological examination with Papanicolaou smear of cervix Postmenopausal. Experiencing hot flushes/night sweats, no HRT with H/O Rt Breast Ca.  No PMB.  No pelvic pain.  No pain with IC.  Last Pap Neg in 07/2018.  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  Discharged from Oncology.  MRI recommended q2 yrs, first in 2023 per Dr Jana Hakim.  Breasts wnl currently, but paraneoplastic neurologic disorder under Rx.  Mammo Neg 06/2020.  BMI 27.1. Good fitness and nutrition. On Valacyclovir for oral HSV.  Overdue for Colonoscopy, will schedule for 2023.  BD normal in 2018.  Will repeat at next Annual Gyn exam. Health labs with Fam MD. - Cytology - PAP( [facility])  2. Postmenopause Postmenopausal. Experiencing hot flushes/night sweats, no HRT with H/O Rt Breast Ca.  No PMB.  No pelvic pain.  No pain with IC. Recommend coconut oil as needed for IC.  3. Malignant neoplasm of upper-inner quadrant of right breast in female, estrogen receptor negative ([place]) MRI of the Breasts q49yrs recommended by Dr Jana Hakim.  4. Herpes simplex virus (HSV) infection Valacyclovir prophylaxis to continue, prescription sent to pharmacy.  Other orders - valACYclovir (VALTREX) 500 MG tablet; Take 1 tablet (500 mg total) by mouth daily.   Princess Bruins MD, 8:15 AM 12/18/2020

## 2020-12-19 LAB — CYTOLOGY - PAP: Diagnosis: NEGATIVE

## 2021-01-22 ENCOUNTER — Encounter: Payer: Self-pay | Admitting: Family Medicine

## 2021-01-22 ENCOUNTER — Ambulatory Visit (INDEPENDENT_AMBULATORY_CARE_PROVIDER_SITE_OTHER): Payer: Managed Care, Other (non HMO) | Admitting: Family Medicine

## 2021-01-22 VITALS — BP 100/80 | HR 55 | Temp 97.7°F | Resp 18 | Ht 67.0 in | Wt 173.4 lb

## 2021-01-22 DIAGNOSIS — H60332 Swimmer's ear, left ear: Secondary | ICD-10-CM

## 2021-01-22 DIAGNOSIS — H6122 Impacted cerumen, left ear: Secondary | ICD-10-CM

## 2021-01-22 MED ORDER — OFLOXACIN 0.3 % OT SOLN: 10.0000 [drp] | Freq: Every day | OTIC | 0 refills | Status: DC

## 2021-01-22 NOTE — Patient Instructions (Signed)
Earwax Buildup, Adult ?The ears produce a substance called earwax that helps keep bacteria out of the ear and protects the skin in the ear canal. Occasionally, earwax can build up in the ear and cause discomfort or hearing loss. ?What are the causes? ?This condition is caused by a buildup of earwax. Ear canals are self-cleaning. Ear wax is made in the outer part of the ear canal and generally falls out in small amounts over time. ?When the self-cleaning mechanism is not working, earwax builds up and can cause decreased hearing and discomfort. Attempting to clean ears with cotton swabs can push the earwax deep into the ear canal and cause decreased hearing and pain. ?What increases the risk? ?This condition is more likely to develop in people who: ?Clean their ears often with cotton swabs. ?Pick at their ears. ?Use earplugs or in-ear headphones often, or wear hearing aids. ?The following factors may also make you more likely to develop this condition: ?Being female. ?Being of older age. ?Naturally producing more earwax. ?Having narrow ear canals. ?Having earwax that is overly thick or sticky. ?Having excess hair in the ear canal. ?Having eczema. ?Being dehydrated. ?What are the signs or symptoms? ?Symptoms of this condition include: ?Reduced or muffled hearing. ?A feeling of fullness in the ear or feeling that the ear is plugged. ?Fluid coming from the ear. ?Ear pain or an itchy ear. ?Ringing in the ear. ?Coughing. ?Balance problems. ?An obvious piece of earwax that can be seen inside the ear canal. ?How is this diagnosed? ?This condition may be diagnosed based on: ?Your symptoms. ?Your medical history. ?An ear exam. During the exam, your health care provider will look into your ear with an instrument called an otoscope. ?You may have tests, including a hearing test. ?How is this treated? ?This condition may be treated by: ?Using ear drops to soften the earwax. ?Having the earwax removed by a health care provider. The  health care provider may: ?Flush the ear with water. ?Use an instrument that has a loop on the end (curette). ?Use a suction device. ?Having surgery to remove the wax buildup. This may be done in severe cases. ?Follow these instructions at home: ? ?Take over-the-counter and prescription medicines only as told by your health care provider. ?Do not put any objects, including cotton swabs, into your ear. You can clean the opening of your ear canal with a washcloth or facial tissue. ?Follow instructions from your health care provider about cleaning your ears. Do not overclean your ears. ?Drink enough fluid to keep your urine pale yellow. This will help to thin the earwax. ?Keep all follow-up visits as told. If earwax builds up in your ears often or if you use hearing aids, consider seeing your health care provider for routine, preventive ear cleanings. Ask your health care provider how often you should schedule your cleanings. ?If you have hearing aids, clean them according to instructions from the manufacturer and your health care provider. ?Contact a health care provider if: ?You have ear pain. ?You develop a fever. ?You have pus or other fluid coming from your ear. ?You have hearing loss. ?You have ringing in your ears that does not go away. ?You feel like the room is spinning (vertigo). ?Your symptoms do not improve with treatment. ?Get help right away if: ?You have bleeding from the affected ear. ?You have severe ear pain. ?Summary ?Earwax can build up in the ear and cause discomfort or hearing loss. ?The most common symptoms of this condition include   reduced or muffled hearing, a feeling of fullness in the ear, or feeling that the ear is plugged. ?This condition may be diagnosed based on your symptoms, your medical history, and an ear exam. ?This condition may be treated by using ear drops to soften the earwax or by having the earwax removed by a health care provider. ?Do not put any objects, including cotton  swabs, into your ear. You can clean the opening of your ear canal with a washcloth or facial tissue. ?This information is not intended to replace advice given to you by your health care provider. Make sure you discuss any questions you have with your health care provider. ?Document Revised: 04/13/2019 Document Reviewed: 04/13/2019 ?Elsevier Patient Education ? 2022 Elsevier Inc. ? ?

## 2021-01-22 NOTE — Progress Notes (Signed)
Established Patient Office Visit  Subjective:  Patient ID: Jeanette Boone, female    DOB: Dec 14, 1960  Age: 61 y.o. MRN: [id number]  CC:  Chief Complaint  Patient presents with   Hearing Problem    X2 months, left ear, pt states hearing loss comes and goes. Pain and on and off. Pt states ears will pop and feels full.     HPI [name] presents for hearing loss L ear.   Her ear feels full and will not pop like usual.   No fever, no congestion.     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   Thrombocytopenia, secondary    secondary to valtrex    Past Surgical History:  Procedure Laterality Date   BREAST LUMPECTOMY Right 2010   BREAST SURGERY  05/2008   lumpectomy - right   CHOLECYSTECTOMY  05/2008   COLONOSCOPY  08/2009   HERNIA REPAIR  8768   umbilical   HERNIA REPAIR  1996   LAPAROSCOPY     port a cath insertion  06/2008   uterine polyp removal  06/2008    Family History  Problem Relation Age of Onset   Hypertension Mother    Heart disease Father        pacemaker----tachycardia   Congestive Heart Failure Father 61   Stroke Maternal Grandmother    Bladder Cancer Maternal Grandmother        dx in her 71s   Heart disease Maternal Grandfather 68       MI   Stroke Maternal Grandfather    Heart disease Paternal Grandfather 64       MI   Breast cancer Other        MGF's sister    Social History   Socioeconomic History   Marital status: Married    Spouse name: donald   Number of children: 0   Years of education: college   Highest education level: Not on file  Occupational History   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    Employer: [employer]    Comment: Full Time  Tobacco Use   Smoking status: Former    Smokeless tobacco: Never  Scientific laboratory technician Use: Never used  Substance and Sexual Activity   Alcohol use: Not Currently    Comment: 0   Drug use: Never   Sexual activity: Yes    Partners: Male    Comment: refused to answer insurance questions  Other Topics Concern   Not on file  Social History Narrative   Lives with husband and son   Patient is right handed.   Patient drinks 4-5 cups caffeine daily   Social Determinants of Health   Financial Resource Strain: Not on file  Food Insecurity: Not on file  Transportation Needs: Not on file  Physical Activity: Not on file  Stress: Not on file  Social Connections: Not on file  Intimate Partner Violence: Not on file    Outpatient Medications Prior to Visit  Medication Sig Dispense Refill   B Complex Vitamins (B-COMPLEX/B-12 PO) Take 1 tablet by mouth daily.      Biotin 1000 MCG tablet Take 1,000 mcg by  mouth daily.      cholecalciferol (VITAMIN D) 1000 UNITS tablet Take 2,000 Units by mouth daily.     DULoxetine (CYMBALTA) 30 MG capsule TAKE ONE CAPSULE BY MOUTH EVERY DAY 90 capsule 3   gabapentin (NEURONTIN) 300 MG capsule TAKE 3 CAPSULES BY MOUTH FOUR TIMES DAILY 1080 capsule 3   Multiple Vitamin (MULTIVITAMIN) tablet Take 1 tablet by mouth daily.     valACYclovir (VALTREX) 500 MG tablet Take 1 tablet (500 mg total) by mouth daily. 90 tablet 4   No facility-administered medications prior to visit.    Allergies  Allergen Reactions   Clarithromycin     Thrush   Other     Steroids---Eye pressure   Penicillins Other (See Comments)    Child hood Did it involve swelling of the face/tongue/throat, SOB, or low BP? Unknown Did it involve sudden or severe rash/hives, skin peeling, or any reaction on the inside of your mouth or nose? Unknown Did you need to seek medical attention at a hospital or doctor's office? Unknown When did it last happen?       If all above answers are NO, may proceed with cephalosporin use.       ROS Review of Systems  Constitutional:  Negative for fever.  HENT:  Positive for ear pain, hearing loss and tinnitus. Negative for congestion, facial swelling, mouth sores, nosebleeds, postnasal drip, rhinorrhea, sinus pressure, sinus pain and sneezing.   Respiratory:  Negative for cough and shortness of breath.   Cardiovascular:  Negative for chest pain, palpitations and leg swelling.  Gastrointestinal:  Negative for vomiting.  Musculoskeletal:  Negative for back pain.  Skin:  Negative for rash.  Neurological:  Negative for headaches.     Objective:    Physical Exam Vitals and nursing note reviewed.  Constitutional:      Appearance: She is well-developed.  HENT:     Head: Normocephalic and atraumatic.     Right Ear: Tympanic membrane, ear canal and external ear normal. Decreased hearing noted.     Left Ear: Decreased hearing noted. There is impacted cerumen.     Ears:     Comments: L ear impacted----- unable to remove with hoop Irrigated successfully with no complications  Canal errythematous  No other abnormalities   Eyes:     Conjunctiva/sclera: Conjunctivae normal.  Neck:     Thyroid: No thyromegaly.     Vascular: No carotid bruit or JVD.  Cardiovascular:     Rate and Rhythm: Normal rate and regular rhythm.     Heart sounds: Normal heart sounds. No murmur heard. Pulmonary:     Effort: Pulmonary effort is normal. No respiratory distress.     Breath sounds: Normal breath sounds. No wheezing or rales.  Chest:     Chest wall: No tenderness.  Musculoskeletal:     Cervical back: Normal range of motion and neck supple.  Neurological:     Mental Status: She is alert and oriented to person, place, and time.    BP 100/80 (BP Location: Left Arm, Patient Position: Sitting, Cuff Size: Normal)    Pulse (!) 55    Temp 97.7 F (36.5 C) (Oral)    Resp 18    Ht '5\' 7"'  (1.702 m)    Wt 173 lb 6.4 oz (78.7 kg)    LMP 05/13/2010    SpO2 99%    BMI 27.16 kg/m  Wt Readings from  Last 3 Encounters:  01/22/21 173 lb 6.4 oz (78.7 kg)  12/18/20  173 lb (78.5 kg)  08/31/20 203 lb 9.6 oz (92.4 kg)     Health Maintenance Due  Topic Date Due   Pneumococcal Vaccine 37-23 Years old (2 - PCV) 05/12/2015   COLONOSCOPY (Pts 45-67yr Insurance coverage will need to be confirmed)  09/08/2019   COVID-19 Vaccine (4 - Booster for Pfizer series) 02/18/2020   INFLUENZA VACCINE  08/07/2020    There are no preventive care reminders to display for this patient.  Lab Results  Component Value Date   TSH 2.32 02/29/2020   Lab Results  Component Value Date   WBC 5.2 04/18/2020   HGB 14.2 04/18/2020   HCT 43.5 04/18/2020   MCV 95.2 04/18/2020   PLT 244 04/18/2020   Lab Results  Component Value Date   NA 140 08/31/2020   K 4.6 08/31/2020   CHLORIDE 102 05/30/2016   CO2 31 08/31/2020   GLUCOSE 81 08/31/2020   BUN 12 08/31/2020   CREATININE 1.01 08/31/2020   BILITOT 0.7 08/31/2020   ALKPHOS 115 08/31/2020   AST 29 08/31/2020   ALT 27 08/31/2020   PROT 6.7 08/31/2020   ALBUMIN 4.1 08/31/2020   CALCIUM 9.9 08/31/2020   ANIONGAP 9 04/18/2020   EGFR 71 (L) 05/30/2016   GFR 61.00 08/31/2020   Lab Results  Component Value Date   CHOL 211 (H) 08/31/2020   Lab Results  Component Value Date   HDL 61.70 08/31/2020   Lab Results  Component Value Date   LDLCALC 132 (H) 08/31/2020   Lab Results  Component Value Date   TRIG 82.0 08/31/2020   Lab Results  Component Value Date   CHOLHDL 3 08/31/2020   Lab Results  Component Value Date   HGBA1C 5.2 02/23/2018      Assessment & Plan:   Problem List Items Addressed This Visit       Unprioritized   Acute swimmer's ear of left side - Primary   Relevant Medications   ofloxacin (FLOXIN OTIC) 0.3 % OTIC solution   Impacted cerumen of left ear    Pt states she feels her hearing is back to baseline since removing impaction Consider ent referral / audiology if she feels her hearing loss is worse        Meds  ordered this encounter  Medications   ofloxacin (FLOXIN OTIC) 0.3 % OTIC solution    Sig: Place 10 drops into the left ear daily.    Dispense:  5 mL    Refill:  0    Follow-up: Return if symptoms worsen or fail to improve.    YAnn Held DO

## 2021-01-22 NOTE — Assessment & Plan Note (Signed)
Pt states she feels her hearing is back to baseline since removing impaction Consider ent referral / audiology if she feels her hearing loss is worse

## 2021-02-27 ENCOUNTER — Telehealth: Payer: Self-pay | Admitting: Neurology

## 2021-02-27 NOTE — Telephone Encounter (Signed)
LVM and sent mychart message informing pt of appt change- Dr. Jaynee Eagles out.

## 2021-03-16 ENCOUNTER — Encounter: Payer: Managed Care, Other (non HMO) | Admitting: Family Medicine

## 2021-04-19 ENCOUNTER — Ambulatory Visit: Payer: Managed Care, Other (non HMO) | Admitting: Neurology

## 2021-04-23 ENCOUNTER — Telehealth: Payer: Self-pay | Admitting: Neurology

## 2021-04-23 NOTE — Telephone Encounter (Signed)
Mychart message sent asking pt to call back and r/s 4/20 appointment- Dr. Jaynee Eagles out of office. LVM on home #, unable to reach or LVM on cell #. ?

## 2021-04-26 ENCOUNTER — Ambulatory Visit: Payer: Managed Care, Other (non HMO) | Admitting: Neurology

## 2021-05-01 ENCOUNTER — Telehealth: Payer: Self-pay | Admitting: Neurology

## 2021-05-01 MED ORDER — GABAPENTIN 300 MG PO CAPS: ORAL_CAPSULE | ORAL | 3 refills | Status: DC

## 2021-05-01 MED ORDER — DULOXETINE HCL 30 MG PO CPEP: ORAL_CAPSULE | ORAL | 3 refills | Status: DC

## 2021-05-01 NOTE — Telephone Encounter (Signed)
Duloxetine and gabapentin refills sent to [facility]. ?

## 2021-05-01 NOTE — Telephone Encounter (Signed)
Pt request refills for gabapentin (NEURONTIN) 300 MG capsule and DULoxetine (CYMBALTA) 30 MG capsule at [facility] #[id number]  ? ?Pt has appt with schedule with Dr. Jaynee Eagles. Was a Dr. Jannifer Franklin transfer ? ? ?

## 2021-05-25 ENCOUNTER — Other Ambulatory Visit (HOSPITAL_BASED_OUTPATIENT_CLINIC_OR_DEPARTMENT_OTHER): Payer: Self-pay | Admitting: Family Medicine

## 2021-05-25 ENCOUNTER — Other Ambulatory Visit: Payer: Self-pay | Admitting: Family Medicine

## 2021-05-25 ENCOUNTER — Encounter: Payer: Self-pay | Admitting: Family Medicine

## 2021-05-25 ENCOUNTER — Ambulatory Visit (HOSPITAL_BASED_OUTPATIENT_CLINIC_OR_DEPARTMENT_OTHER)
Admission: RE | Admit: 2021-05-25 | Discharge: 2021-05-25 | Disposition: A | Discharge status: Home / Self Care | Payer: Managed Care, Other (non HMO) | Source: Ambulatory Visit | Attending: Family Medicine | Admitting: Family Medicine

## 2021-05-25 ENCOUNTER — Ambulatory Visit (INDEPENDENT_AMBULATORY_CARE_PROVIDER_SITE_OTHER): Payer: Managed Care, Other (non HMO) | Admitting: Family Medicine

## 2021-05-25 VITALS — BP 108/80 | HR 60 | Temp 98.3°F | Resp 18 | Ht 67.0 in | Wt 191.4 lb

## 2021-05-25 DIAGNOSIS — R8271 Bacteriuria: Secondary | ICD-10-CM

## 2021-05-25 DIAGNOSIS — R52 Pain, unspecified: Secondary | ICD-10-CM | POA: Diagnosis present

## 2021-05-25 DIAGNOSIS — Z171 Estrogen receptor negative status [ER-]: Secondary | ICD-10-CM

## 2021-05-25 DIAGNOSIS — M25551 Pain in right hip: Secondary | ICD-10-CM

## 2021-05-25 DIAGNOSIS — M25552 Pain in left hip: Secondary | ICD-10-CM

## 2021-05-25 DIAGNOSIS — D499 Neoplasm of unspecified behavior of unspecified site: Secondary | ICD-10-CM

## 2021-05-25 DIAGNOSIS — D708 Other neutropenia: Secondary | ICD-10-CM | POA: Diagnosis not present

## 2021-05-25 DIAGNOSIS — G0489 Other myelitis: Secondary | ICD-10-CM | POA: Diagnosis not present

## 2021-05-25 DIAGNOSIS — Z780 Asymptomatic menopausal state: Secondary | ICD-10-CM

## 2021-05-25 DIAGNOSIS — Z0000 Encounter for general adult medical examination without abnormal findings: Secondary | ICD-10-CM

## 2021-05-25 DIAGNOSIS — M546 Pain in thoracic spine: Secondary | ICD-10-CM | POA: Diagnosis not present

## 2021-05-25 DIAGNOSIS — E559 Vitamin D deficiency, unspecified: Secondary | ICD-10-CM

## 2021-05-25 DIAGNOSIS — C50211 Malignant neoplasm of upper-inner quadrant of right female breast: Secondary | ICD-10-CM

## 2021-05-25 DIAGNOSIS — D696 Thrombocytopenia, unspecified: Secondary | ICD-10-CM

## 2021-05-25 DIAGNOSIS — G130 Paraneoplastic neuromyopathy and neuropathy: Secondary | ICD-10-CM

## 2021-05-25 LAB — COMPREHENSIVE METABOLIC PANEL
ALT: 15 U/L (ref 0–35)
AST: 17 U/L (ref 0–37)
Albumin: 4.3 g/dL (ref 3.5–5.2)
Alkaline Phosphatase: 103 U/L (ref 39–117)
BUN: 13 mg/dL (ref 6–23)
CO2: 30 mEq/L (ref 19–32)
Calcium: 9.9 mg/dL (ref 8.4–10.5)
Chloride: 103 mEq/L (ref 96–112)
Creatinine, Ser: 0.97 mg/dL (ref 0.40–1.20)
GFR: 63.7 mL/min (ref >60.00)
Glucose, Bld: 85 mg/dL (ref 70–99)
Potassium: 4.3 mEq/L (ref 3.5–5.1)
Sodium: 142 mEq/L (ref 135–145)
Total Bilirubin: 0.5 mg/dL (ref 0.2–1.2)
Total Protein: 7.1 g/dL (ref 6.0–8.3)

## 2021-05-25 LAB — LIPID PANEL
Cholesterol: 269 mg/dL — ABNORMAL HIGH (ref 0–200)
HDL: 92.5 mg/dL (ref >39.00)
LDL Cholesterol: 160 mg/dL — ABNORMAL HIGH (ref 0–99)
NonHDL: 176.44
Total CHOL/HDL Ratio: 3
Triglycerides: 84 mg/dL (ref 0.0–149.0)
VLDL: 16.8 mg/dL (ref 0.0–40.0)

## 2021-05-25 LAB — POC URINALSYSI DIPSTICK (AUTOMATED)
Bilirubin, UA: NEGATIVE
Blood, UA: NEGATIVE
Glucose, UA: NEGATIVE
Leukocytes, UA: NEGATIVE
Nitrite, UA: POSITIVE
Protein, UA: NEGATIVE
Spec Grav, UA: 1.025 (ref 1.010–1.025)
Urobilinogen, UA: 0.2 E.U./dL
pH, UA: 5 (ref 5.0–8.0)

## 2021-05-25 LAB — CBC WITH DIFFERENTIAL/PLATELET
Basophils Absolute: 0 10*3/uL (ref 0.0–0.1)
Basophils Relative: 1 % (ref 0.0–3.0)
Eosinophils Absolute: 0.1 10*3/uL (ref 0.0–0.7)
Eosinophils Relative: 2.6 % (ref 0.0–5.0)
HCT: 42.8 % (ref 36.0–46.0)
Hemoglobin: 13.9 g/dL (ref 12.0–15.0)
Lymphocytes Relative: 36.7 % (ref 12.0–46.0)
Lymphs Abs: 1.7 10*3/uL (ref 0.7–4.0)
MCHC: 32.6 g/dL (ref 30.0–36.0)
MCV: 98.6 fl (ref 78.0–100.0)
Monocytes Absolute: 0.3 10*3/uL (ref 0.1–1.0)
Monocytes Relative: 6.8 % (ref 3.0–12.0)
Neutro Abs: 2.4 10*3/uL (ref 1.4–7.7)
Neutrophils Relative %: 52.9 % (ref 43.0–77.0)
Platelets: 225 10*3/uL (ref 150.0–400.0)
RBC: 4.34 Mil/uL (ref 3.87–5.11)
RDW: 12.3 % (ref 11.5–15.5)
WBC: 4.6 10*3/uL (ref 4.0–10.5)

## 2021-05-25 LAB — TSH: TSH: 2.37 u[IU]/mL (ref 0.35–5.50)

## 2021-05-25 LAB — VITAMIN D 25 HYDROXY (VIT D DEFICIENCY, FRACTURES): VITD: 46.19 ng/mL (ref 30.00–100.00)

## 2021-05-25 NOTE — Progress Notes (Addendum)
Subjective:     Jeanette Boone is a 61 y.o. female and is here for a comprehensive physical exam. The patient reports problems - hip pain x several months --- no known injury   she is also still c/o ears feeling full and would like them checked  . 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    Employer: [employer]    Comment: Full Time  Tobacco Use   Smoking status: Former   Smokeless tobacco: Never  Scientific laboratory technician Use: Never used  Substance and Sexual Activity   Alcohol use: Not Currently    Comment: 0   Drug use: Never   Sexual activity: Yes    Partners: Male    Comment: refused to answer insurance questions  Other Topics Concern   Not on file  Social History Narrative   Lives with husband and son   Patient is right handed.   Patient drinks 4-5 cups caffeine daily   Walking some    Social Determinants of Health   Financial Resource Strain: Not on file  Food Insecurity: Not on file  Transportation Needs: Not on file  Physical Activity: Not on file  Stress: Not on file  Social Connections: Not on file  Intimate Partner Violence: Not on file   Health Maintenance  Topic Date Due   COLONOSCOPY (Pts 45-34yr Insurance coverage will need to be confirmed)  09/08/2019   COVID-19 Vaccine (4 - Booster for PAsotinseries) 02/18/2020   MAMMOGRAM  06/15/2021   INFLUENZA VACCINE  08/07/2021   PAP SMEAR-Modifier  12/19/2023   TETANUS/TDAP  10/29/2028   Hepatitis C Screening  Completed   HIV Screening  Completed   Zoster Vaccines- Shingrix  Completed   HPV VACCINES  Aged Out    The following portions of the patient's history were reviewed and updated  as appropriate: She  has a past medical history of Breast cancer ([place], Chronic leg pain, Endometriosis, Family history of bladder cancer, Family history of breast cancer, Leg cramps (11/19/2016), Obesity, Paresthesias, Sensory neuronopathy (11/02/2012), and Thrombocytopenia, secondary. She does not have any pertinent problems on file. She  has a past surgical history that includes Hernia repair (1995); Hernia repair (1996); Cholecystectomy (05/2008); uterine polyp removal (06/2008); Colonoscopy (08/2009); port a cath insertion (06/2008); Breast surgery (05/2008); laparoscopy; and Breast lumpectomy (Right, 2010). Her family history includes Bladder Cancer in her maternal grandmother; Breast cancer in an other family member; Congestive Heart Failure (age of onset: 876 in her father; Heart disease in her father; Heart disease (age of onset: 524 in her maternal grandfather; Heart disease (age of onset: 816 in her paternal grandfather; Hypertension in her mother; Stroke in her maternal grandfather and maternal grandmother. She  reports that she has quit smoking. She has never used smokeless tobacco. She reports that she does not currently use alcohol. She reports that she does not use drugs. She has a current medication list which includes the following prescription(s): b complex vitamins, biotin, cholecalciferol, multivitamin, valacyclovir, duloxetine, and gabapentin. Current Outpatient Medications on File Prior to Visit  Medication Sig Dispense Refill   B Complex  Vitamins (B-COMPLEX/B-12 PO) Take 1 tablet by mouth daily.      Biotin 1000 MCG tablet Take 1,000 mcg by mouth daily.      cholecalciferol (VITAMIN D) 1000 UNITS tablet Take 2,000 Units by mouth daily.     Multiple Vitamin (MULTIVITAMIN) tablet Take 1 tablet by mouth daily.     valACYclovir (VALTREX) 500 MG tablet Take 1 tablet (500 mg total) by mouth daily. 90 tablet 4   No current facility-administered medications on file prior to visit.   She is  allergic to clarithromycin, other, and penicillins..  Review of Systems Review of Systems  Constitutional: Negative for activity change, appetite change and fatigue.  HENT: Negative for hearing loss, congestion, tinnitus and ear discharge.  dentist q15mEyes: Negative for visual disturbance (see optho q1y -- vision corrected to 20/20 with glasses).  Respiratory: Negative for cough, chest tightness and shortness of breath.   Cardiovascular: Negative for chest pain, palpitations and leg swelling.  Gastrointestinal: Negative for abdominal pain, diarrhea, constipation and abdominal distention.  Genitourinary: Negative for urgency, frequency, decreased urine volume and difficulty urinating.  Musculoskeletal: Negative for back pain, + hip pain Skin: Negative for color change, pallor and rash.  Neurological: Negative for dizziness, light-headedness, numbness and headaches.  Hematological: Negative for adenopathy. Does not bruise/bleed easily.  Psychiatric/Behavioral: Negative for suicidal ideas, confusion, sleep disturbance, self-injury, dysphoric mood, decreased concentration and agitation.      Objective:    BP 108/80 (BP Location: Left Arm, Patient Position: Sitting, Cuff Size: Large)   Pulse 60   Temp 98.3 F (36.8 C) (Oral)   Resp 18   Ht '5\' 7"'$  (1.702 m)   Wt 191 lb 6.4 oz (86.8 kg)   LMP 05/13/2010   SpO2 92%   BMI 29.98 kg/m  General appearance: alert, cooperative, appears stated age, and no distress Head: Normocephalic, without obvious abnormality, atraumatic Eyes: conjunctivae/corneas clear. PERRL, EOM's intact. Fundi benign. Ears: normal TM's and external ear canals both ears Nose: Nares normal. Septum midline. Mucosa normal. No drainage or sinus tenderness. Throat: lips, mucosa, and tongue normal; teeth and gums normal Neck: no adenopathy, no carotid bruit, no JVD, supple, symmetrical, trachea midline, and thyroid not enlarged, symmetric, no tenderness/mass/nodules Back:  symmetric, no curvature. ROM normal. No CVA tenderness. Lungs: clear to auscultation bilaterally Heart: regular rate and rhythm, S1, S2 normal, no murmur, click, rub or gallop Abdomen: soft, non-tender; bowel sounds normal; no masses,  no organomegaly Extremities: extremities normal, atraumatic, no cyanosis or edema Pulses: 2+ and symmetric Skin: Skin color, texture, turgor normal. No rashes or lesions Lymph nodes: Cervical, supraclavicular, and axillary nodes normal. Neurologic: Alert and oriented X 3, normal strength and tone. Normal symmetric reflexes. Normal coordination and gait    Assessment:    Healthy female exam.      Plan:    Ghm utd Check labs  See After Visit Summary for Counseling Recommendations   1. Preventative health care Ghm utd Check labs  See avs  - CBC with Differential/Platelet - Comprehensive metabolic panel - Lipid panel - TSH  2. Postmenopause   - VITAMIN D 25 Hydroxy (Vit-D Deficiency, Fractures)  3. Other neutropenia (HCC)    4. 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 Thrombocytopenia, unspecified ([facility] Check labs today  7. 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 Bilateral  hip pain Check xray  10. Acute bilateral thoracic back pain   - POCT Urinalysis Dipstick (Automated)  11. Bacteriuria   - Urine Culture

## 2021-05-25 NOTE — Patient Instructions (Signed)
Preventive Care 40-60 Years Old, Female Preventive care refers to lifestyle choices and visits with your health care provider that can promote health and wellness. Preventive care visits are also called wellness exams. What can I expect for my preventive care visit? Counseling Your health care provider may ask you questions about your: Medical history, including: Past medical problems. Family medical history. Pregnancy history. Current health, including: Menstrual cycle. Method of birth control. Emotional well-being. Home life and relationship well-being. Sexual activity and sexual health. Lifestyle, including: Alcohol, nicotine or tobacco, and drug use. Access to firearms. Diet, exercise, and sleep habits. Work and work environment. Sunscreen use. Safety issues such as seatbelt and bike helmet use. Physical exam Your health care provider will check your: Height and weight. These may be used to calculate your BMI (body mass index). BMI is a measurement that tells if you are at a healthy weight. Waist circumference. This measures the distance around your waistline. This measurement also tells if you are at a healthy weight and may help predict your risk of certain diseases, such as type 2 diabetes and high blood pressure. Heart rate and blood pressure. Body temperature. Skin for abnormal spots. What immunizations do I need?  Vaccines are usually given at various ages, according to a schedule. Your health care provider will recommend vaccines for you based on your age, medical history, and lifestyle or other factors, such as travel or where you work. What tests do I need? Screening Your health care provider may recommend screening tests for certain conditions. This may include: Lipid and cholesterol levels. Diabetes screening. This is done by checking your blood sugar (glucose) after you have not eaten for a while (fasting). Pelvic exam and Pap test. Hepatitis B test. Hepatitis C  test. HIV (human immunodeficiency virus) test. STI (sexually transmitted infection) testing, if you are at risk. Lung cancer screening. Colorectal cancer screening. Mammogram. Talk with your health care provider about when you should start having regular mammograms. This may depend on whether you have a family history of breast cancer. BRCA-related cancer screening. This may be done if you have a family history of breast, ovarian, tubal, or peritoneal cancers. Bone density scan. This is done to screen for osteoporosis. Talk with your health care provider about your test results, treatment options, and if necessary, the need for more tests. Follow these instructions at home: Eating and drinking  Eat a diet that includes fresh fruits and vegetables, whole grains, lean protein, and low-fat dairy products. Take vitamin and mineral supplements as recommended by your health care provider. Do not drink alcohol if: Your health care provider tells you not to drink. You are pregnant, may be pregnant, or are planning to become pregnant. If you drink alcohol: Limit how much you have to 0-1 drink a day. Know how much alcohol is in your drink. In the [place] one drink equals one 12 oz bottle of beer (355 mL), one 5 oz glass of wine (148 mL), or one 1 oz glass of hard liquor (44 mL). Lifestyle Brush your teeth every morning and night with fluoride toothpaste. Floss one time each day. Exercise for at least 30 minutes 5 or more days each week. Do not use any products that contain nicotine or tobacco. These products include cigarettes, chewing tobacco, and vaping devices, such as e-cigarettes. If you need help quitting, ask your health care provider. Do not use drugs. If you are sexually active, practice safe sex. Use a condom or other form of protection to   prevent STIs. If you do not wish to become pregnant, use a form of birth control. If you plan to become pregnant, see your health care provider for a  prepregnancy visit. Take aspirin only as told by your health care provider. Make sure that you understand how much to take and what form to take. Work with your health care provider to find out whether it is safe and beneficial for you to take aspirin daily. Find healthy ways to manage stress, such as: Meditation, yoga, or listening to music. Journaling. Talking to a trusted person. Spending time with friends and family. Minimize exposure to UV radiation to reduce your risk of skin cancer. Safety Always wear your seat belt while driving or riding in a vehicle. Do not drive: If you have been drinking alcohol. Do not ride with someone who has been drinking. When you are tired or distracted. While texting. If you have been using any mind-altering substances or drugs. Wear a helmet and other protective equipment during sports activities. If you have firearms in your house, make sure you follow all gun safety procedures. Seek help if you have been physically or sexually abused. What's next? Visit your health care provider once a year for an annual wellness visit. Ask your health care provider how often you should have your eyes and teeth checked. Stay up to date on all vaccines. This information is not intended to replace advice given to you by your health care provider. Make sure you discuss any questions you have with your health care provider. Document Revised: 06/21/2020 Document Reviewed: 06/21/2020 Elsevier Patient Education  [place].

## 2021-05-26 LAB — URINE CULTURE
MICRO NUMBER:: 13420259
SPECIMEN QUALITY:: ADEQUATE

## 2021-05-28 ENCOUNTER — Ambulatory Visit: Payer: Managed Care, Other (non HMO) | Admitting: Neurology

## 2021-06-21 ENCOUNTER — Encounter: Payer: Self-pay | Admitting: Neurology

## 2021-06-21 ENCOUNTER — Ambulatory Visit (INDEPENDENT_AMBULATORY_CARE_PROVIDER_SITE_OTHER): Payer: Managed Care, Other (non HMO) | Admitting: Neurology

## 2021-06-21 VITALS — BP 105/76 | HR 70 | Ht 67.0 in | Wt 194.6 lb

## 2021-06-21 DIAGNOSIS — G549 Nerve root and plexus disorder, unspecified: Secondary | ICD-10-CM | POA: Diagnosis not present

## 2021-06-21 DIAGNOSIS — D499 Neoplasm of unspecified behavior of unspecified site: Secondary | ICD-10-CM | POA: Diagnosis not present

## 2021-06-21 DIAGNOSIS — G130 Paraneoplastic neuromyopathy and neuropathy: Secondary | ICD-10-CM

## 2021-06-21 MED ORDER — DULOXETINE HCL 30 MG PO CPEP: ORAL_CAPSULE | ORAL | 3 refills | Status: DC

## 2021-06-21 MED ORDER — GABAPENTIN 300 MG PO CAPS: ORAL_CAPSULE | ORAL | 3 refills | Status: DC

## 2021-06-21 NOTE — Progress Notes (Signed)
[facility]    Provider:  Dr Jaynee Eagles Requesting Provider: Carollee Herter, Alferd Apa, * Primary Care Provider:  Carollee Herter, Alferd Apa, DO  CC:  sensory neuropathy  HPI:  Jeanette Boone is a 61 y.o. female here as requested by Carollee Herter, Alferd Apa, * for neuropathy. She has a PMHx of breast cancer and she is transitioning to me since Dr. Jannifer Franklin retired.  Last seen 04/19/2020 by Dr. Jannifer Franklin.  I reviewed Dr. Tobey Grim notes, the patient developed a sensory neuropathy, chronic symptoms, she had remained stable from the prior year, remaining on high-dose gabapentin 900 mg 4 times a day and 30 mg of Cymbalta.  She denies any balance issues.  She is able to continue working on a regular basis.  Dr. Jannifer Franklin refilled medications and suggested increasing her Cymbalta but she wished to remain on the current regiment.  She is here alone, ongoing > 10 years she has been a patient since Dr. Erling Cruz, when Dr. Erling Cruz retired she saw Dr. Jannifer Franklin. The neuropathy started in one foot and progressed throughout her body, she has burning, numbness, sharp tingling and needles and a pulsing from "head to toe". It started in her right foot and over the next few months it spread, thought to be a paraneoplastic process. She has been extensively worked up, sensory polyneuropathy. She has it all over and in her whole scalp. All over her body, sensory neuropathy. No falls or significant imbalance. Caffeine makes it worse. The medications help. She is stable over the last year. She takes a high dose of Gabapentin. Chemo made it worse but after chemo   Reviewed notes, labs and imaging from outside physicians, which showed:  MRI brain 07/2018: IMPRESSION: This MRI of the brain with and without contrast shows the following: 1.   Scattered T2/flair hyperintense foci in the subcortical and deep white matter.  This is a nonspecific finding but is most consistent with chronic microvascular ischemic change or the sequela of migraine  headaches.  These are less consistent with demyelination.  The number of foci has progressed compared to the 2010 MRI. 2.   There are no acute findings and there is a normal enhancement pattern.  11/22/2013:  MRI cervical spine IMPRESSION:  Abnormal MRI cervical spine (with and without) demonstrating: 1. At C5-6: disc bulging and uncovertebral joint hypertrophy with severe right foraminal stenosis. 2. At C3-4: uncovertebral joint and facet hypertrophy with mild right foraminal stenosis. 3. At C6-7: left uncovertebral joint hypertrophy with mild left foraminal stenosis. 4. No intrinsic or abnormal enhancing spinal cord lesions. 5. Compared to MRI on 05/26/08, there is mild progression of disc disease at C5-6, C3-4, C6-7.     In May 2020 Dr. Jannifer Franklin ordered lab work including RPR, sed rate, HIV, serum copper, angiotensin-converting enzyme, ANA, Lyme antibodies, multiple myeloma panel and she had already been tested for vvitamin D, hemoglobin A1c, CMP, B12, TSH, CBC a few months prior.  B12 was normal at 433.  All blood work were unremarkable.  Review of Systems: Patient complains of symptoms per HPI as well as the following symptoms hip pain. Pertinent negatives and positives per HPI. All others negative.   Social History   Socioeconomic History   Marital status: Married    Spouse name: donald   Number of children: 0   Years of education: college   Highest education level: Not on file  Occupational History   Occupation: Pharmacist, hospital.    Employer: [employer]  Comment: Full Time  Tobacco Use   Smoking status: Former   Smokeless tobacco: Never  Vaping Use   Vaping Use: Never used  Substance and Sexual Activity   Alcohol use: Not Currently    Comment: 0   Drug use: Never   Sexual activity: Yes    Partners: Male    Comment: refused to answer insurance questions  Other Topics Concern   Not on file  Social History Narrative   Lives with husband and son   Patient  is right handed.   Patient drinks 4-5 cups caffeine daily   Walking some    Social Determinants of Health   Financial Resource Strain: Not on file  Food Insecurity: Not on file  Transportation Needs: Not on file  Physical Activity: Not on file  Stress: Not on file  Social Connections: Not on file  Intimate Partner Violence: Not on file    Family History  Problem Relation Age of Onset   Hypertension Mother    Heart disease Father        pacemaker----tachycardia   Congestive Heart Failure Father 23   Stroke Maternal Grandmother    Bladder Cancer Maternal Grandmother        dx in her 15s   Heart disease Maternal Grandfather 15       MI   Stroke Maternal Grandfather    Heart disease Paternal Grandfather 44       MI   Breast cancer Other        MGF's sister    Past Medical History:  Diagnosis Date   Breast cancer ([place])    right   Chronic leg pain    Endometriosis    Family history of bladder cancer    Family history of breast cancer    Leg cramps 11/19/2016   Obesity    Paresthesias    Generalized   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   Thrombocytopenia, secondary    secondary to valtrex    Patient Active Problem List   Diagnosis Date Noted   Other neutropenia ([place]) 05/25/2021   Morbid obesity ([place]) 05/25/2021   Impacted cerumen of left ear 01/22/2021   Acute swimmer's ear of left side 01/22/2021   Hyperlipidemia 08/31/2020   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[place])    Dense breast tissue on mammogram 07/29/2017   Fatty liver 03/31/2017   Leg cramps 11/19/2016   Iron deficiency anemia 02/29/2016   Genetic testing 01/25/2016   Family history of breast cancer    Family history of bladder cancer    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   Malignant neoplasm of upper-inner quadrant of right breast in female, estrogen receptor negative ([place]) 10/01/2012   ANXIETY STATE, UNSPECIFIED 07/01/2008   FIBROIDS, UTERUS 06/27/2008   PELVIC  PAIN 06/27/2008   THROMBOCYTOPENIA  03/20/2008   Herpes simplex virus (HSV) infection 02/03/2008   MUSCLE PAIN 02/03/2008    Past Surgical History:  Procedure Laterality Date   BREAST LUMPECTOMY Right 2010   BREAST SURGERY  05/2008   lumpectomy - right   CHOLECYSTECTOMY  05/2008   COLONOSCOPY  08/2009   HERNIA REPAIR  2119   umbilical   HERNIA REPAIR  1996   LAPAROSCOPY     port a cath insertion  06/2008   uterine polyp removal  06/2008    Current Outpatient Medications  Medication Sig Dispense Refill   B Complex Vitamins (B-COMPLEX/B-12 PO) Take 1 tablet by mouth daily.      Biotin 1000 MCG tablet Take 1,000 mcg by mouth  daily.      cholecalciferol (VITAMIN D) 1000 UNITS tablet Take 2,000 Units by mouth daily.     Multiple Vitamin (MULTIVITAMIN) tablet Take 1 tablet by mouth daily.     valACYclovir (VALTREX) 500 MG tablet Take 1 tablet (500 mg total) by mouth daily. 90 tablet 4   DULoxetine (CYMBALTA) 30 MG capsule TAKE ONE CAPSULE BY MOUTH EVERY DAY 90 capsule 3   gabapentin (NEURONTIN) 300 MG capsule TAKE 3 CAPSULES BY MOUTH FOUR TIMES DAILY 1080 capsule 3   No current facility-administered medications for this visit.    Allergies as of 06/21/2021 - Review Complete 06/21/2021  Allergen Reaction Noted   Clarithromycin  05/23/2008   Other  10/29/2018   Penicillins Other (See Comments) 04/06/2007    Vitals: BP 105/76   Pulse 70   Ht _0  (1.702 m)   Wt 194 lb 9.6 oz (88.3 kg)   LMP 05/13/2010   BMI 30.48 kg/m  Last Weight:  Wt Readings from Last 1 Encounters:  06/21/21 194 lb 9.6 oz (88.3 kg)   Last Height:   Ht Readings from Last 1 Encounters:  06/21/21 _1  (1.702 m)     Physical exam: Exam: Gen: NAD, conversant, well nourised, well groomed                     CV: RRR, no MRG. No Carotid Bruits. No peripheral edema, warm, nontender Eyes: Conjunctivae clear without exudates or hemorrhage  Neuro: Detailed Neurologic Exam  Speech:    Speech is normal; fluent and spontaneous with normal  comprehension.  Cognition:    The patient is oriented to person, place, and time;     recent and remote memory intact;     language fluent;     normal attention, concentration,     fund of knowledge Cranial Nerves:    The pupils are equal, round, and reactive to light. Pupils too small to visualize fundi. Visual fields are full to finger confrontation. Extraocular movements are intact. Trigeminal sensation is intact and the muscles of mastication are normal. The face is symmetric. The palate elevates in the midline. Hearing intact. Voice is normal. Shoulder shrug is normal. The tongue has normal motion without fasciculations.   Coordination:    Normal finger to nose and heel to shin.   Gait:     normal.   Motor Observation:    No asymmetry, no atrophy, and no involuntary movements noted. Tone:    Normal muscle tone.    Posture:    Posture is normal. normal erect    Strength:    Strength is V/V in the upper and lower limbs.      Sensation: intact to LT, pain, temperature, vibration, cold, but has allodynia     Reflex Exam:  DTR's:    Deep tendon reflexes in the upper and lower extremities are normal bilaterally.   Toes:    The toes are downgoing bilaterally.   Clonus:    Clonus is absent.    Assessment/Plan:  Patient with body paresthesias thought to be paraneoplastic prior to being diagnosed with breast cancer. She is stable. Refill medications. We discussed genetic testing, at this time she is not interested.  Meds ordered this encounter  Medications   gabapentin (NEURONTIN) 300 MG capsule    Sig: TAKE 3 CAPSULES BY MOUTH FOUR TIMES DAILY    Dispense:  1080 capsule    Refill:  3   DULoxetine (CYMBALTA) 30 MG capsule  Sig: TAKE ONE CAPSULE BY MOUTH EVERY DAY    Dispense:  90 capsule    Refill:  3    Please dispense generic    Cc: Ann Held, *,  Ann Held, DO  Sarina Ill, MD  [facility] [address] [place], [place] [address]  Phone [phone] Fax [phone]  I spent over 40 minutes of face-to-face and non-face-to-face time with patient on the  1. 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 Sensory neuronopathy    diagnosis.  This included previsit chart review, lab review, study review, order entry, electronic health record documentation, patient education on the different diagnostic and therapeutic options, counseling and coordination of care, risks and benefits of management, compliance, or risk factor reduction

## 2021-07-19 ENCOUNTER — Telehealth: Payer: Self-pay | Admitting: Family Medicine

## 2021-07-19 NOTE — Telephone Encounter (Signed)
Patient states she got a letter from [place] stating they wont cover the Vit D drawn during her CPE on 05/19. They told her they wont cover it due to it not being medically necessary, so she would like to know if something can be done since she has been getting her Vit D checked multiple times and has never been charged. Please advise.

## 2021-07-19 NOTE — Telephone Encounter (Signed)
I think the Dx code needs to be changed to Vitamin D Deficiency?

## 2021-07-23 DIAGNOSIS — E559 Vitamin D deficiency, unspecified: Secondary | ICD-10-CM | POA: Insufficient documentation

## 2021-07-23 NOTE — Telephone Encounter (Signed)
I attempted to change the Dx but was unable. Can we change the Dx from post menopause to Vit D Def for the Vitamin D lab please?

## 2021-07-23 NOTE — Assessment & Plan Note (Signed)
Recheck vita D  con't vita d3

## 2021-08-28 ENCOUNTER — Other Ambulatory Visit: Payer: Self-pay | Admitting: Family Medicine

## 2021-08-28 DIAGNOSIS — Z1231 Encounter for screening mammogram for malignant neoplasm of breast: Secondary | ICD-10-CM

## 2021-09-20 ENCOUNTER — Ambulatory Visit: Payer: Managed Care, Other (non HMO)

## 2021-10-03 ENCOUNTER — Ambulatory Visit
Admission: RE | Admit: 2021-10-03 | Discharge: 2021-10-03 | Disposition: A | Discharge status: Home / Self Care | Payer: Managed Care, Other (non HMO) | Source: Ambulatory Visit | Attending: Family Medicine | Admitting: Family Medicine

## 2021-10-03 DIAGNOSIS — Z1231 Encounter for screening mammogram for malignant neoplasm of breast: Secondary | ICD-10-CM

## 2021-12-07 ENCOUNTER — Encounter: Payer: Self-pay | Admitting: Oncology

## 2021-12-13 ENCOUNTER — Ambulatory Visit: Payer: Managed Care, Other (non HMO) | Admitting: Medical

## 2021-12-13 ENCOUNTER — Ambulatory Visit (HOSPITAL_BASED_OUTPATIENT_CLINIC_OR_DEPARTMENT_OTHER)
Admission: RE | Admit: 2021-12-13 | Discharge: 2021-12-13 | Disposition: A | Discharge status: Home / Self Care | Payer: Managed Care, Other (non HMO) | Source: Ambulatory Visit | Attending: Medical | Admitting: Medical

## 2021-12-13 ENCOUNTER — Other Ambulatory Visit: Payer: Self-pay | Admitting: Medical

## 2021-12-13 VITALS — BP 117/78 | HR 58 | Temp 97.8°F | Resp 18 | Ht 67.0 in | Wt 212.6 lb

## 2021-12-13 DIAGNOSIS — R059 Cough, unspecified: Secondary | ICD-10-CM | POA: Insufficient documentation

## 2021-12-13 DIAGNOSIS — E785 Hyperlipidemia, unspecified: Secondary | ICD-10-CM | POA: Diagnosis not present

## 2021-12-13 DIAGNOSIS — J069 Acute upper respiratory infection, unspecified: Secondary | ICD-10-CM | POA: Diagnosis not present

## 2021-12-13 LAB — LIPID PANEL
Cholesterol: 258 mg/dL — ABNORMAL HIGH (ref 0–200)
HDL: 80.6 mg/dL (ref >39.00)
LDL Cholesterol: 159 mg/dL — ABNORMAL HIGH (ref 0–99)
NonHDL: 177.12
Total CHOL/HDL Ratio: 3
Triglycerides: 91 mg/dL (ref 0.0–149.0)
VLDL: 18.2 mg/dL (ref 0.0–40.0)

## 2021-12-13 LAB — COMPREHENSIVE METABOLIC PANEL
ALT: 16 U/L (ref 0–35)
AST: 18 U/L (ref 0–37)
Albumin: 4.2 g/dL (ref 3.5–5.2)
Alkaline Phosphatase: 123 U/L — ABNORMAL HIGH (ref 39–117)
BUN: 12 mg/dL (ref 6–23)
CO2: 30 mEq/L (ref 19–32)
Calcium: 9.6 mg/dL (ref 8.4–10.5)
Chloride: 101 mEq/L (ref 96–112)
Creatinine, Ser: 0.98 mg/dL (ref 0.40–1.20)
GFR: 62.68 mL/min (ref >60.00)
Glucose, Bld: 86 mg/dL (ref 70–99)
Potassium: 4.5 mEq/L (ref 3.5–5.1)
Sodium: 138 mEq/L (ref 135–145)
Total Bilirubin: 0.4 mg/dL (ref 0.2–1.2)
Total Protein: 7 g/dL (ref 6.0–8.3)

## 2021-12-13 LAB — POCT INFLUENZA A/B
Influenza A, POC: NEGATIVE
Influenza B, POC: NEGATIVE

## 2021-12-13 MED ORDER — AZELASTINE HCL 0.1 % NA SOLN: 2.0000 | Freq: Two times a day (BID) | NASAL | 1 refills | Status: DC

## 2021-12-13 MED ORDER — BENZONATATE 100 MG PO CAPS: 100.0000 mg | ORAL_CAPSULE | Freq: Three times a day (TID) | ORAL | 0 refills | Status: DC | PRN

## 2021-12-13 MED ORDER — ALBUTEROL SULFATE HFA 108 (90 BASE) MCG/ACT IN AERS: 2.0000 | INHALATION_SPRAY | Freq: Four times a day (QID) | RESPIRATORY_TRACT | 0 refills | Status: DC | PRN

## 2021-12-13 NOTE — Addendum Note (Signed)
Addended by: Jeronimo Greaves on: 12/13/2021 09:04 AM   Modules accepted: Orders

## 2021-12-13 NOTE — Patient Instructions (Addendum)
Uri vs allergic rhinitis past 2 weeks with lingering cough and nasal congestion. Will make benzonatate available for cough and astelin nasal spray for congestion.  Albuterol inhaler to use if needed for shortness of breath or wheezing.  Counseled on your infection concerns.  Covid test and flu test negative.  Offered send out rsv but declines.   Follow up as regularly scheduled with pcp or sooner if needed.

## 2021-12-13 NOTE — Progress Notes (Signed)
Subjective:    Patient ID: Jeanette Boone, female    DOB: 12/02/1960, 61 y.o.   MRN: [id number]  HPI  Pt in for recent illness. Pt has 2 weeks cough, congestion, runny nose and laryngitis. No fever, no chills, no sweats or body aches.   No wheezing.   Pt about to visit her 59 year old mother.  She checked covid test and was negative.   Pt does not want to take any meds. Want reassurance not infectious.   Review of Systems  Constitutional:  Negative for appetite change, diaphoresis, fatigue and fever.  HENT:  Negative for congestion.   Respiratory:  Positive for cough. Negative for wheezing.   Cardiovascular:  Negative for chest pain and palpitations.  Gastrointestinal:  Negative for abdominal pain.  Musculoskeletal:  Negative for back pain.  Hematological:  Negative for adenopathy. Does not bruise/bleed easily.  Psychiatric/Behavioral:  Negative for behavioral problems, confusion, decreased concentration, hallucinations and sleep disturbance.    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    Employer: [employer]    Comment: Full Time  Tobacco Use   Smoking status: Former   Smokeless tobacco: Never  Scientific laboratory technician Use: Never used  Substance and Sexual Activity   Alcohol use: Not Currently    Comment: 0   Drug use: Never   Sexual activity: Yes    Partners: Male    Comment: refused to answer insurance questions  Other  Topics Concern   Not on file  Social History Narrative   Lives with husband and son   Patient is right handed.   Patient drinks 4-5 cups caffeine daily   Walking some    Social Determinants of Health   Financial Resource Strain: Not on file  Food Insecurity: Not on file  Transportation Needs: Not on file  Physical Activity: Not on file  Stress: Not on file  Social Connections: Not on file  Intimate Partner Violence: Not on file    Past Surgical History:  Procedure Laterality Date   BREAST LUMPECTOMY Right 2010   BREAST SURGERY  05/2008   lumpectomy - right   CHOLECYSTECTOMY  05/2008   COLONOSCOPY  08/2009   HERNIA REPAIR  7412   umbilical   HERNIA REPAIR  1996   LAPAROSCOPY     port a cath insertion  06/2008   uterine polyp removal  06/2008    Family History  Problem Relation Age of Onset   Hypertension Mother    Heart disease Father        pacemaker----tachycardia   Congestive Heart Failure Father 41   Stroke Maternal Grandmother    Bladder Cancer Maternal Grandmother        dx in her 51s   Heart disease Maternal Grandfather 50  MI   Stroke Maternal Grandfather    Heart disease Paternal Grandfather 56       MI   Breast cancer Other        MGF's sister    Allergies  Allergen Reactions   Clarithromycin     Thrush   Other     Steroids---Eye pressure   Penicillins Other (See Comments)    Child hood Did it involve swelling of the face/tongue/throat, SOB, or low BP? Unknown Did it involve sudden or severe rash/hives, skin peeling, or any reaction on the inside of your mouth or nose? Unknown Did you need to seek medical attention at a hospital or doctor's office? Unknown When did it last happen?       If all above answers are "NO", may proceed with cephalosporin use.      Current Outpatient Medications on File Prior to Visit  Medication Sig Dispense Refill   B Complex Vitamins (B-COMPLEX/B-12 PO) Take 1 tablet by mouth daily.      Biotin 1000 MCG  tablet Take 1,000 mcg by mouth daily.      cholecalciferol (VITAMIN D) 1000 UNITS tablet Take 2,000 Units by mouth daily.     DULoxetine (CYMBALTA) 30 MG capsule TAKE ONE CAPSULE BY MOUTH EVERY DAY 90 capsule 3   gabapentin (NEURONTIN) 300 MG capsule TAKE 3 CAPSULES BY MOUTH FOUR TIMES DAILY 1080 capsule 3   Multiple Vitamin (MULTIVITAMIN) tablet Take 1 tablet by mouth daily.     valACYclovir (VALTREX) 500 MG tablet Take 1 tablet (500 mg total) by mouth daily. 90 tablet 4   No current facility-administered medications on file prior to visit.    BP 117/78   Pulse (!) 58   Temp 97.8 F (36.6 C)   Resp 18   Ht '5\' 7"'$  (1.702 m)   Wt 212 lb 9.6 oz (96.4 kg)   LMP 05/13/2010   SpO2 100%   BMI 33.30 kg/m        Objective:   Physical Exam  General Mental Status- Alert. General Appearance- Not in acute distress.   Skin General: Color- Normal Color. Moisture- Normal Moisture.  Neck Carotid Arteries- Normal color. Moisture- Normal Moisture. No carotid bruits. No JVD.  Chest and Lung Exam Auscultation: Breath Sounds:-Normal.  Cardiovascular Auscultation:Rythm- Regular. Murmurs & Other Heart Sounds:Auscultation of the heart reveals- No Murmurs.  Abdomen Inspection:-Inspeection Normal. Palpation/Percussion:Note:No mass. Palpation and Percussion of the abdomen reveal- Non Tender, Non Distended + BS, no rebound or guarding.  Neurologic Cranial Nerve exam:- CN III-XII intact(No nystagmus), symmetric smile. Strength:- 5/5 equal and symmetric strength both upper and lower extremities.    Heent- no sinus pressure to palpation. Non boggy turbinates. Mild pnd.       Assessment & Plan:   Patient Instructions  [place]uel [place] vs allergic rhinitis past 2 weeks with lingering cough and nasal congestion will make benzonatate available for cough and astelin nasal spray.  Albuterol inhaler to use if needed for shortness of breath or wheezing.  Counseled on your infection concerns.  Covid test  and flu test negative.  Offered send out rsv but declines.   Follow up as regularly scheduled with pcp or sooner if needed.   Mackie Pai, PA-C

## 2021-12-13 NOTE — Addendum Note (Signed)
Addended by: Anabel Halon on: 12/13/2021 09:12 AM   Modules accepted: Orders

## 2021-12-20 ENCOUNTER — Ambulatory Visit: Payer: Managed Care, Other (non HMO) | Admitting: Obstetrics & Gynecology

## 2022-01-28 ENCOUNTER — Other Ambulatory Visit: Payer: Self-pay | Admitting: *Deleted

## 2022-01-28 MED ORDER — VALACYCLOVIR HCL 500 MG PO TABS: 500.0000 mg | ORAL_TABLET | Freq: Every day | ORAL | 0 refills | Status: DC

## 2022-02-19 ENCOUNTER — Ambulatory Visit: Payer: Managed Care, Other (non HMO) | Admitting: Obstetrics & Gynecology

## 2022-02-25 ENCOUNTER — Other Ambulatory Visit: Payer: Self-pay | Admitting: Obstetrics & Gynecology

## 2022-02-26 NOTE — Telephone Encounter (Signed)
Medication refill request: Valtrex  Last AEX:  12-18-20 ML Next AEX: 05-01-22 Last MMG (if hormonal medication request): n/a Refill authorized: Today, please advise.   Medication pended for #30, 0RF. Patient rescheduled appointment on 02-19-22 to 05-01-22. Please refill if appropriate.

## 2022-03-28 ENCOUNTER — Other Ambulatory Visit: Payer: Self-pay | Admitting: Obstetrics & Gynecology

## 2022-03-29 NOTE — Telephone Encounter (Signed)
Med refill request: Valtrex Last AEX: 12/18/20 Next AEX: 05/01/22 Last MMG (if hormonal med) 09/13/21 BI-RADS CAT 1 neg Refill authorized: Please Advise?

## 2022-04-26 ENCOUNTER — Other Ambulatory Visit: Payer: Self-pay | Admitting: Obstetrics & Gynecology

## 2022-04-26 NOTE — Telephone Encounter (Signed)
Medication refill request: valtrex  Last AEX:  12-18-20 with ML Next AEX: 05-01-22 Last MMG (if hormonal medication request): n/a Refill authorized: please approve if appropriate

## 2022-05-01 ENCOUNTER — Ambulatory Visit: Payer: Managed Care, Other (non HMO) | Admitting: Obstetrics & Gynecology

## 2022-05-26 ENCOUNTER — Other Ambulatory Visit: Payer: Self-pay | Admitting: Radiology

## 2022-05-27 NOTE — Telephone Encounter (Signed)
Med refill request: Valtrex Last AEX: 12/18/20 (last ordered 04/26/22) Next OV: 07/02/22 Last MMG (if hormonal med) n/a Refill authorized: Please Advise?

## 2022-05-31 ENCOUNTER — Encounter: Payer: Managed Care, Other (non HMO) | Admitting: Family Medicine

## 2022-06-14 ENCOUNTER — Encounter: Payer: Self-pay | Admitting: Family Medicine

## 2022-06-14 ENCOUNTER — Ambulatory Visit (INDEPENDENT_AMBULATORY_CARE_PROVIDER_SITE_OTHER): Payer: Managed Care, Other (non HMO) | Admitting: Family Medicine

## 2022-06-14 VITALS — BP 124/78 | HR 60 | Temp 97.8°F | Resp 16 | Ht 67.0 in | Wt 227.2 lb

## 2022-06-14 DIAGNOSIS — R0602 Shortness of breath: Secondary | ICD-10-CM

## 2022-06-14 DIAGNOSIS — R079 Chest pain, unspecified: Secondary | ICD-10-CM

## 2022-06-14 DIAGNOSIS — B001 Herpesviral vesicular dermatitis: Secondary | ICD-10-CM

## 2022-06-14 DIAGNOSIS — D708 Other neutropenia: Secondary | ICD-10-CM

## 2022-06-14 DIAGNOSIS — C50211 Malignant neoplasm of upper-inner quadrant of right female breast: Secondary | ICD-10-CM

## 2022-06-14 DIAGNOSIS — E785 Hyperlipidemia, unspecified: Secondary | ICD-10-CM

## 2022-06-14 DIAGNOSIS — Z0000 Encounter for general adult medical examination without abnormal findings: Secondary | ICD-10-CM | POA: Diagnosis not present

## 2022-06-14 DIAGNOSIS — D499 Neoplasm of unspecified behavior of unspecified site: Secondary | ICD-10-CM

## 2022-06-14 DIAGNOSIS — D500 Iron deficiency anemia secondary to blood loss (chronic): Secondary | ICD-10-CM

## 2022-06-14 DIAGNOSIS — G130 Paraneoplastic neuromyopathy and neuropathy: Secondary | ICD-10-CM

## 2022-06-14 DIAGNOSIS — Z171 Estrogen receptor negative status [ER-]: Secondary | ICD-10-CM

## 2022-06-14 DIAGNOSIS — G0489 Other myelitis: Secondary | ICD-10-CM

## 2022-06-14 DIAGNOSIS — D696 Thrombocytopenia, unspecified: Secondary | ICD-10-CM

## 2022-06-14 MED ORDER — VALACYCLOVIR HCL 500 MG PO TABS: 500.0000 mg | ORAL_TABLET | Freq: Every day | ORAL | 3 refills | Status: DC

## 2022-06-14 NOTE — Assessment & Plan Note (Signed)
Pt will start back with exercise and diet

## 2022-06-14 NOTE — Patient Instructions (Signed)
Preventive Care 61-61 Years Old, Female Preventive care refers to lifestyle choices and visits with your health care provider that can promote health and wellness. Preventive care visits are also called wellness exams. What can I expect for my preventive care visit? Counseling Your health care provider may ask you questions about your: Medical history, including: Past medical problems. Family medical history. Pregnancy history. Current health, including: Menstrual cycle. Method of birth control. Emotional well-being. Home life and relationship well-being. Sexual activity and sexual health. Lifestyle, including: Alcohol, nicotine or tobacco, and drug use. Access to firearms. Diet, exercise, and sleep habits. Work and work environment. Sunscreen use. Safety issues such as seatbelt and bike helmet use. Physical exam Your health care provider will check your: Height and weight. These may be used to calculate your BMI (body mass index). BMI is a measurement that tells if you are at a healthy weight. Waist circumference. This measures the distance around your waistline. This measurement also tells if you are at a healthy weight and may help predict your risk of certain diseases, such as type 2 diabetes and high blood pressure. Heart rate and blood pressure. Body temperature. Skin for abnormal spots. What immunizations do I need?  Vaccines are usually given at various ages, according to a schedule. Your health care provider will recommend vaccines for you based on your age, medical history, and lifestyle or other factors, such as travel or where you work. What tests do I need? Screening Your health care provider may recommend screening tests for certain conditions. This may include: Lipid and cholesterol levels. Diabetes screening. This is done by checking your blood sugar (glucose) after you have not eaten for a while (fasting). Pelvic exam and Pap test. Hepatitis B test. Hepatitis C  test. HIV (human immunodeficiency virus) test. STI (sexually transmitted infection) testing, if you are at risk. Lung cancer screening. Colorectal cancer screening. Mammogram. Talk with your health care provider about when you should start having regular mammograms. This may depend on whether you have a family history of breast cancer. BRCA-related cancer screening. This may be done if you have a family history of breast, ovarian, tubal, or peritoneal cancers. Bone density scan. This is done to screen for osteoporosis. Talk with your health care provider about your test results, treatment options, and if necessary, the need for more tests. Follow these instructions at home: Eating and drinking  Eat a diet that includes fresh fruits and vegetables, whole grains, lean protein, and low-fat dairy products. Take vitamin and mineral supplements as recommended by your health care provider. Do not drink alcohol if: Your health care provider tells you not to drink. You are pregnant, may be pregnant, or are planning to become pregnant. If you drink alcohol: Limit how much you have to 0-1 drink a day. Know how much alcohol is in your drink. In the [place] one drink equals one 12 oz bottle of beer (355 mL), one 5 oz glass of wine (148 mL), or one 1 oz glass of hard liquor (44 mL). Lifestyle Brush your teeth every morning and night with fluoride toothpaste. Floss one time each day. Exercise for at least 30 minutes 5 or more days each week. Do not use any products that contain nicotine or tobacco. These products include cigarettes, chewing tobacco, and vaping devices, such as e-cigarettes. If you need help quitting, ask your health care provider. Do not use drugs. If you are sexually active, practice safe sex. Use a condom or other form of protection to   prevent STIs. If you do not wish to become pregnant, use a form of birth control. If you plan to become pregnant, see your health care provider for a  prepregnancy visit. Take aspirin only as told by your health care provider. Make sure that you understand how much to take and what form to take. Work with your health care provider to find out whether it is safe and beneficial for you to take aspirin daily. Find healthy ways to manage stress, such as: Meditation, yoga, or listening to music. Journaling. Talking to a trusted person. Spending time with friends and family. Minimize exposure to UV radiation to reduce your risk of skin cancer. Safety Always wear your seat belt while driving or riding in a vehicle. Do not drive: If you have been drinking alcohol. Do not ride with someone who has been drinking. When you are tired or distracted. While texting. If you have been using any mind-altering substances or drugs. Wear a helmet and other protective equipment during sports activities. If you have firearms in your house, make sure you follow all gun safety procedures. Seek help if you have been physically or sexually abused. What's next? Visit your health care provider once a year for an annual wellness visit. Ask your health care provider how often you should have your eyes and teeth checked. Stay up to date on all vaccines. This information is not intended to replace advice given to you by your health care provider. Make sure you discuss any questions you have with your health care provider. Document Revised: 06/21/2020 Document Reviewed: 06/21/2020 Elsevier Patient Education  2024 [employer]vier Inc.  

## 2022-06-14 NOTE — Assessment & Plan Note (Signed)
Ghm utd Check labs  See AVS  Health Maintenance  Topic Date Due   COVID-19 Vaccine (4 - 2023-24 season) 06/30/2022 (Originally 09/07/2021)   INFLUENZA VACCINE  08/08/2022   MAMMOGRAM  10/04/2022   PAP SMEAR-Modifier  12/19/2023   DTaP/Tdap/Td (3 - Td or Tdap) 10/29/2028   Colonoscopy  08/28/2031   Hepatitis C Screening  Completed   HIV Screening  Completed   Zoster Vaccines- Shingrix  Completed   HPV VACCINES  Aged Out

## 2022-06-14 NOTE — Assessment & Plan Note (Signed)
Check cbcd ?

## 2022-06-14 NOTE — Assessment & Plan Note (Signed)
Encourage heart healthy diet such as MIND or DASH diet, increase exercise, avoid trans fats, simple carbohydrates and processed foods, consider a krill or fish or flaxseed oil cap daily.  °

## 2022-06-14 NOTE — Progress Notes (Addendum)
Subjective:   By signing my name below, I, Carlena Bjornstad, attest that this documentation has been prepared under the direction and in the presence of Seabron Spates R, DO. 06/14/2022.   Patient ID: Jeanette Boone, female    DOB: [birth year], 61 y.o.   MRN: [id number]  Chief Complaint  Patient presents with   Annual Exam    Annual Exam     HPI Patient is in today for a comprehensive physical exam.  She is concerned about worsening symptoms of shortness of breath, labored breathing, and chest tightness. Her symptoms are not constant but are especially noticeably while sitting down. She is currently sitting down in the exam room and experiencing active tightness in her chest. We will check an EKG today. She had a chest x-ray 12/13/2021 which we reviewed in detail. In the past, she has had similar symptoms that subsided after losing weight.   Also she has been having some difficulty hearing. Recently her left ear had felt full/clogged.  She endorses athlete's foot of her right foot without pruritus. She has been following with Dr. Emily Filbert.  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 Currently taking valacyclovir 500 mg daily. If increasing to 1000 mg, she would prefer to take two tablets of 500 mg rather than 1 tablet of 1000 mg.  Still having hot flashes.  She isn't fasting today, and she would like to return at a later date for lab work. Notes that her cholesterol has been high the last 3 times.  Social history:  Her father died of congestive heart failure when he was 61 yo. She also reports a family history of heart attacks. Colonoscopy:  Last completed 08/27/2021. Recommended for repeat in 10 years. Dexa:  Last completed 10/01/2016. Pap Smear:  Last completed 12/18/2020. Mammogram:  Last completed 10/03/2021. Immunizations:  Covid-19 vaccine last received 12/24/2019. Influenza vaccine received 02/29/2020. Tdap received 10/30/2018. Shingrix vaccination completed  01/06/2018. Diet: She attributes her dyspnea and chest tightness to her significant weight gain in the past 6 months. With her busy work schedule she has fallen out of the habit of exercising routinely and adhering to her diet. She complains of some gastric/abdominal pain that she thinks is related to drinking too much caffeine. Exercise: She does plan to work on increasing her exercise soon. At this time she defers referral to [facility].  Denies having any fever, new muscle pain, joint pain, new moles, congestion, sinus pain, sore throat, palpitations, cough, wheezing, n/v/d, constipation, blood in stool, dysuria, frequency, hematuria, at this time.  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   Thrombocytopenia, secondary    secondary to valtrex    Past Surgical History:  Procedure Laterality Date   BREAST LUMPECTOMY Right 2010   BREAST SURGERY  05/2008   lumpectomy - right   CHOLECYSTECTOMY  05/2008   COLONOSCOPY  08/2009   HERNIA REPAIR  1995   umbilical   HERNIA REPAIR  1996   LAPAROSCOPY     port a cath insertion  06/2008   uterine polyp removal  06/2008    Family History  Problem Relation Age of Onset  Hypertension Mother    Heart disease Father        pacemaker----tachycardia   Congestive Heart Failure Father 64   Stroke Maternal Grandmother    Bladder Cancer Maternal Grandmother        dx in her 55s   Heart disease Maternal Grandfather 61       MI   Stroke Maternal Grandfather    Heart disease Paternal Grandfather 35       MI   Breast cancer Other        MGF's sister    Social History   Socioeconomic History   Marital status: Married    Spouse name: donald   Number of children: 0   Years of education:  college   Highest education level: Not on file  Occupational History   Occupation: Civil engineer, 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    Employer: [employer]    Comment: Full Time  Tobacco Use   Smoking status: Former   Smokeless tobacco: Never  Building services engineer Use: Never used  Substance and Sexual Activity   Alcohol use: Not Currently    Comment: 0   Drug use: Never   Sexual activity: Yes    Partners: Male    Comment: refused to answer insurance questions  Other Topics Concern   Not on file  Social History Narrative   Lives with husband and son   Patient is right handed.   Patient drinks 4-5 cups caffeine daily   Walking some    Social Determinants of Health   Financial Resource Strain: Not on file  Food Insecurity: Not on file  Transportation Needs: Not on file  Physical Activity: Not on file  Stress: Not on file  Social Connections: Not on file  Intimate Partner Violence: Not on file    Outpatient Medications Prior to Visit  Medication Sig Dispense Refill   B Complex Vitamins (B-COMPLEX/B-12 PO) Take 1 tablet by mouth daily.      Biotin 1000 MCG tablet Take 1,000 mcg by mouth daily.      cholecalciferol (VITAMIN D) 1000 UNITS tablet Take 2,000 Units by mouth daily.     DULoxetine (CYMBALTA) 30 MG capsule TAKE ONE CAPSULE BY MOUTH EVERY DAY 90 capsule 3   gabapentin (NEURONTIN) 300 MG capsule TAKE 3 CAPSULES BY MOUTH FOUR TIMES DAILY 1080 capsule 3   Multiple Vitamin (MULTIVITAMIN) tablet Take 1 tablet by mouth daily.     valACYclovir (VALTREX) 500 MG tablet Take 1 tablet (500 mg total) by mouth daily. Needs office visit for further refills. 30 tablet 0   albuterol (VENTOLIN HFA) 108 (90 Base) MCG/ACT inhaler Inhale 2 puffs into the lungs every 6 (six) hours as needed. 18 g 0   azelastine (ASTELIN) 0.1 % nasal spray USE 2 SPRAYS IN EACH NOSTRIL TWICE DAILY AS DIRECTED 90 mL 0   benzonatate (TESSALON) 100 MG capsule Take 1 capsule (100 mg total) by mouth 3 (three) times daily as  needed. 30 capsule 0   No facility-administered medications prior to visit.    Allergies  Allergen Reactions   Clarithromycin     Thrush   Other     Steroids---Eye pressure   Penicillins Other (See Comments)    Child hood Did it involve swelling of the face/tongue/throat, SOB, or low BP? Unknown Did it involve sudden or severe rash/hives, skin peeling, or any reaction on the inside of your mouth or nose? Unknown Did you need to seek medical attention at a hospital or  doctor's office? Unknown When did it last happen?       If all above answers are "NO", may proceed with cephalosporin use.      Review of Systems  Constitutional:  Negative for fever and malaise/fatigue.       + Hot flashes  HENT:  Negative for congestion, sinus pain and sore throat.        + Chronic cold sores of mouth and nose + Hearing difficulty/clogged sensation of left ear  Eyes:  Negative for blurred vision.  Respiratory:  Positive for shortness of breath. Negative for cough and wheezing.   Cardiovascular:  Positive for chest pain (Tightness). Negative for palpitations and leg swelling.  Gastrointestinal:  Positive for abdominal pain. Negative for blood in stool, constipation, diarrhea, nausea and vomiting.  Genitourinary:  Negative for dysuria, frequency and hematuria.  Musculoskeletal:  Negative for falls, joint pain and myalgias.  Skin:  Negative for itching and rash.       +Athlete's foot of right foot (-) New moles.  Neurological:  Negative for dizziness, loss of consciousness and headaches.  Endo/Heme/Allergies:  Negative for environmental allergies.  Psychiatric/Behavioral:  Negative for depression. The patient is not nervous/anxious.        Objective:    Physical Exam Vitals and nursing note reviewed.  Constitutional:      Appearance: Normal appearance.  HENT:     Head: Normocephalic and atraumatic.     Right Ear: Tympanic membrane, ear canal and external ear normal.     Left Ear:  Tympanic membrane, ear canal and external ear normal.     Ears:     Comments: Cerumen is present in left ear but not impacted; left TM is visualized.    Nose: Nose normal.  Eyes:     Extraocular Movements: Extraocular movements intact.     Pupils: Pupils are equal, round, and reactive to light.  Cardiovascular:     Rate and Rhythm: Normal rate and regular rhythm.     Heart sounds: Normal heart sounds. No murmur heard.    No gallop.  Pulmonary:     Effort: Pulmonary effort is normal. No respiratory distress.     Breath sounds: Normal breath sounds. No wheezing or rales.  Abdominal:     General: Bowel sounds are normal. There is no distension.     Palpations: Abdomen is soft.     Tenderness: There is no abdominal tenderness. There is no guarding.  Musculoskeletal:        General: Normal range of motion.  Skin:    General: Skin is warm and dry.  Neurological:     General: No focal deficit present.     Mental Status: She is alert and oriented to person, place, and time.  Psychiatric:        Mood and Affect: Mood normal.        Behavior: Behavior normal.     BP 124/78 (BP Location: Left Arm, Patient Position: Sitting, Cuff Size: Normal)   Pulse 60   Temp 97.8 F (36.6 C) (Oral)   Resp 16   Ht 5\' 7"  (1.702 m)   Wt 227 lb 3.2 oz (103.1 kg)   LMP 05/13/2010   SpO2 96%   BMI 35.58 kg/m  Wt Readings from Last 3 Encounters:  06/14/22 227 lb 3.2 oz (103.1 kg)  12/13/21 212 lb 9.6 oz (96.4 kg)  06/21/21 194 lb 9.6 oz (88.3 kg)    Diabetic Foot Exam - Simple   No data  filed    Lab Results  Component Value Date   WBC 4.6 05/25/2021   HGB 13.9 05/25/2021   HCT 42.8 05/25/2021   PLT 225.0 05/25/2021   GLUCOSE 86 12/13/2021   CHOL 258 (H) 12/13/2021   TRIG 91.0 12/13/2021   HDL 80.60 12/13/2021   LDLDIRECT 140.9 12/18/2011   LDLCALC 159 (H) 12/13/2021   ALT 16 12/13/2021   AST 18 12/13/2021   NA 138 12/13/2021   K 4.5 12/13/2021   CL 101 12/13/2021   CREATININE 0.98  12/13/2021   BUN 12 12/13/2021   CO2 30 12/13/2021   TSH 2.37 05/25/2021   HGBA1C 5.2 02/23/2018    Lab Results  Component Value Date   TSH 2.37 05/25/2021   Lab Results  Component Value Date   WBC 4.6 05/25/2021   HGB 13.9 05/25/2021   HCT 42.8 05/25/2021   MCV 98.6 05/25/2021   PLT 225.0 05/25/2021   Lab Results  Component Value Date   NA 138 12/13/2021   K 4.5 12/13/2021   CHLORIDE 102 05/30/2016   CO2 30 12/13/2021   GLUCOSE 86 12/13/2021   BUN 12 12/13/2021   CREATININE 0.98 12/13/2021   BILITOT 0.4 12/13/2021   ALKPHOS 123 (H) 12/13/2021   AST 18 12/13/2021   ALT 16 12/13/2021   PROT 7.0 12/13/2021   ALBUMIN 4.2 12/13/2021   CALCIUM 9.6 12/13/2021   ANIONGAP 9 04/18/2020   EGFR 71 (L) 05/30/2016   GFR 62.68 12/13/2021   Lab Results  Component Value Date   CHOL 258 (H) 12/13/2021   Lab Results  Component Value Date   HDL 80.60 12/13/2021   Lab Results  Component Value Date   LDLCALC 159 (H) 12/13/2021   Lab Results  Component Value Date   TRIG 91.0 12/13/2021   Lab Results  Component Value Date   CHOLHDL 3 12/13/2021   Lab Results  Component Value Date   HGBA1C 5.2 02/23/2018       Assessment & Plan:   Problem List Items Addressed This Visit       Unprioritized   THROMBOCYTOPENIA   Malignant neoplasm of upper-inner quadrant of right breast in female, estrogen receptor negative (HCC)   Relevant Medications   valACYclovir (VALTREX) 500 MG tablet   Other neutropenia (HCC)   SOB (shortness of breath)   Relevant Orders   EKG 12-Lead (Completed)   DG Chest 2 View   Ambulatory referral to Cardiology   Preventative health care - Primary    Ghm utd Check labs  See AVS  Health Maintenance  Topic Date Due   COVID-19 Vaccine (4 - 2023-24 season) 06/30/2022 (Originally 09/07/2021)   INFLUENZA VACCINE  08/08/2022   MAMMOGRAM  10/04/2022   PAP SMEAR-Modifier  12/19/2023   DTaP/Tdap/Td (3 - Td or Tdap) 10/29/2028   Colonoscopy  08/28/2031    Hepatitis C Screening  Completed   HIV Screening  Completed   Zoster Vaccines- Shingrix  Completed   HPV VACCINES  Aged Out        Relevant Orders   CBC with Differential/Platelet   Comprehensive metabolic panel   Lipid panel   TSH   Paraneoplastic myelitis (HCC)   Morbid obesity (HCC)    Pt will start back with exercise and diet       Iron deficiency anemia    Check cbcd      Hyperlipidemia    Encourage heart healthy diet such as MIND or DASH diet, increase exercise, avoid trans fats,  simple carbohydrates and processed foods, consider a krill or fish or flaxseed oil cap daily.        Relevant Orders   CBC with Differential/Platelet   Comprehensive metabolic panel   Lipid panel   TSH   Lipoprotein A (LPA)   Cold sore   Relevant Medications   valACYclovir (VALTREX) 500 MG tablet   Chest pain    EKG --- sinus brady Event monitor  Refer to cardiology      Relevant Orders   EKG 12-Lead (Completed)   DG Chest 2 View   Ambulatory referral to Cardiology   Other Visit Diagnoses     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HCC)   (Chronic)          Meds ordered this encounter  Medications   valACYclovir (VALTREX) 500 MG tablet    Sig: Take 1 tablet (500 mg total) by mouth daily. Needs office visit for further refills.    Dispense:  180 tablet    Refill:  3    I, Donato Schultz, DO, personally preformed the services described in this documentation.  All medical record entries made by the scribe were at my direction and in my presence.  I have reviewed the chart and discharge instructions (if applicable) and agree that the record reflects my personal performance and is accurate and complete. 06/14/2022.  I,Mathew Stumpf,acting as a Neurosurgeon for [employer], DO.,have documented all relevant documentation on the behalf of Donato Schultz, DO,as directed by  Donato Schultz, DO while in the presence of Donato Schultz, DO.   Donato Schultz,  DO

## 2022-06-14 NOTE — Assessment & Plan Note (Signed)
EKG --- sinus brady Event monitor  Refer to cardiology

## 2022-06-18 ENCOUNTER — Other Ambulatory Visit (INDEPENDENT_AMBULATORY_CARE_PROVIDER_SITE_OTHER): Payer: Managed Care, Other (non HMO)

## 2022-06-18 DIAGNOSIS — E785 Hyperlipidemia, unspecified: Secondary | ICD-10-CM | POA: Diagnosis not present

## 2022-06-18 DIAGNOSIS — Z0000 Encounter for general adult medical examination without abnormal findings: Secondary | ICD-10-CM

## 2022-06-18 LAB — CBC WITH DIFFERENTIAL/PLATELET
Basophils Absolute: 0.1 10*3/uL (ref 0.0–0.1)
Basophils Relative: 1 % (ref 0.0–3.0)
Eosinophils Absolute: 0.1 10*3/uL (ref 0.0–0.7)
Eosinophils Relative: 2.7 % (ref 0.0–5.0)
HCT: 41.4 % (ref 36.0–46.0)
Hemoglobin: 13.6 g/dL (ref 12.0–15.0)
Lymphocytes Relative: 29.4 % (ref 12.0–46.0)
Lymphs Abs: 1.5 10*3/uL (ref 0.7–4.0)
MCHC: 32.7 g/dL (ref 30.0–36.0)
MCV: 97.7 fl (ref 78.0–100.0)
Monocytes Absolute: 0.4 10*3/uL (ref 0.1–1.0)
Monocytes Relative: 7.4 % (ref 3.0–12.0)
Neutro Abs: 3.1 10*3/uL (ref 1.4–7.7)
Neutrophils Relative %: 59.5 % (ref 43.0–77.0)
Platelets: 243 10*3/uL (ref 150.0–400.0)
RBC: 4.24 Mil/uL (ref 3.87–5.11)
RDW: 13.1 % (ref 11.5–15.5)
WBC: 5.2 10*3/uL (ref 4.0–10.5)

## 2022-06-18 LAB — COMPREHENSIVE METABOLIC PANEL
ALT: 17 U/L (ref 0–35)
AST: 19 U/L (ref 0–37)
Albumin: 4.1 g/dL (ref 3.5–5.2)
Alkaline Phosphatase: 106 U/L (ref 39–117)
BUN: 11 mg/dL (ref 6–23)
CO2: 28 mEq/L (ref 19–32)
Calcium: 9.3 mg/dL (ref 8.4–10.5)
Chloride: 103 mEq/L (ref 96–112)
Creatinine, Ser: 1.01 mg/dL (ref 0.40–1.20)
GFR: 60.24 mL/min (ref >60.00)
Glucose, Bld: 92 mg/dL (ref 70–99)
Potassium: 4.5 mEq/L (ref 3.5–5.1)
Sodium: 139 mEq/L (ref 135–145)
Total Bilirubin: 0.4 mg/dL (ref 0.2–1.2)
Total Protein: 6.7 g/dL (ref 6.0–8.3)

## 2022-06-18 LAB — LIPID PANEL
Cholesterol: 240 mg/dL — ABNORMAL HIGH (ref 0–200)
HDL: 76.5 mg/dL (ref >39.00)
LDL Cholesterol: 146 mg/dL — ABNORMAL HIGH (ref 0–99)
NonHDL: 163.08
Total CHOL/HDL Ratio: 3
Triglycerides: 87 mg/dL (ref 0.0–149.0)
VLDL: 17.4 mg/dL (ref 0.0–40.0)

## 2022-06-18 LAB — TSH: TSH: 4.01 u[IU]/mL (ref 0.35–5.50)

## 2022-06-19 NOTE — Progress Notes (Deleted)
[facility]    Provider:  Dr Taelyr Jantz Requesting Provider: [name], [name], * Primary Care Provider:  [name], [name], DO  CC:  sensory neuropathy  HPI:  Jeanette Boone is a 61 y.o. female here as requested by [name], [name], * for neuropathy. She has a PMHx of breast cancer and she is transitioning to me since Dr. Willis retired.  Last seen 04/19/2020 by Dr. Willis.  I reviewed Dr. Willis's notes, the patient developed a sensory neuropathy, chronic symptoms, she had remained stable from the prior year, remaining on high-dose gabapentin 900 mg 4 times a day and 30 mg of Cymbalta.  She denies any balance issues.  She is able to continue working on a regular basis.  Dr. Willis refilled medications and suggested increasing her Cymbalta but she wished to remain on the current regiment.  She is here alone, ongoing > 10 years she has been a patient since Dr. Love, when Dr. Love retired she saw Dr. Willis. The neuropathy started in one foot and progressed throughout her body, she has burning, numbness, sharp tingling and needles and a pulsing from "head to toe". It started in her right foot and over the next few months it spread, thought to be a paraneoplastic process. She has been extensively worked up, sensory polyneuropathy. She has it all over and in her whole scalp. All over her body, sensory neuropathy. No falls or significant imbalance. Caffeine makes it worse. The medications help. She is stable over the last year. She takes a high dose of Gabapentin. Chemo made it worse but after chemo   Reviewed notes, labs and imaging from outside physicians, which showed:  MRI brain 07/2018: IMPRESSION: This MRI of the brain with and without contrast shows the following: 1.   Scattered T2/flair hyperintense foci in the subcortical and deep white matter.  This is a nonspecific finding but is most consistent with chronic microvascular ischemic change or the sequela of migraine  headaches.  These are less consistent with demyelination.  The number of foci has progressed compared to the 2010 MRI. 2.   There are no acute findings and there is a normal enhancement pattern.  11/22/2013:  MRI cervical spine IMPRESSION:  Abnormal MRI cervical spine (with and without) demonstrating: 1. At C5-6: disc bulging and uncovertebral joint hypertrophy with severe right foraminal stenosis. 2. At C3-4: uncovertebral joint and facet hypertrophy with mild right foraminal stenosis. 3. At C6-7: left uncovertebral joint hypertrophy with mild left foraminal stenosis. 4. No intrinsic or abnormal enhancing spinal cord lesions. 5. Compared to MRI on 05/26/08, there is mild progression of disc disease at C5-6, C3-4, C6-7.     In May 2020 Dr. Willis ordered lab work including RPR, sed rate, HIV, serum copper, angiotensin-converting enzyme, ANA, Lyme antibodies, multiple myeloma panel and she had already been tested for vvitamin D, hemoglobin A1c, CMP, B12, TSH, CBC a few months prior.  B12 was normal at 433.  All blood work were unremarkable.  Review of Systems: Patient complains of symptoms per HPI as well as the following symptoms hip pain. Pertinent negatives and positives per HPI. All others negative.   Social History   Socioeconomic History   Marital status: Married    Spouse name: donald   Number of children: 0   Years of education: college   Highest education level: Not on file  Occupational History   Occupation: [employer]    Employer: [employer]      Comment: Full Time  Tobacco Use   Smoking status: Former   Smokeless tobacco: Never  Vaping Use   Vaping Use: Never used  Substance and Sexual Activity   Alcohol use: Not Currently    Comment: 0   Drug use: Never   Sexual activity: Yes    Partners: Male    Comment: refused to answer insurance questions  Other Topics Concern   Not on file  Social History Narrative   Lives with husband and son   Patient  is right handed.   Patient drinks 4-5 cups caffeine daily   Walking some    Social Determinants of Health   Financial Resource Strain: Not on file  Food Insecurity: Not on file  Transportation Needs: Not on file  Physical Activity: Not on file  Stress: Not on file  Social Connections: Not on file  Intimate Partner Violence: Not on file    Family History  Problem Relation Age of Onset   Hypertension Mother    Heart disease Father        pacemaker----tachycardia   Congestive Heart Failure Father 88   Stroke Maternal Grandmother    Bladder Cancer Maternal Grandmother        dx in her 80s   Heart disease Maternal Grandfather 50       MI   Stroke Maternal Grandfather    Heart disease Paternal Grandfather 80       MI   Breast cancer Other        MGF's sister    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   Thrombocytopenia, secondary    secondary to valtrex    Patient Active Problem List   Diagnosis Date Noted   Other neutropenia (HCC) 05/25/2021   Morbid obesity (HCC) 05/25/2021   Impacted cerumen of left ear 01/22/2021   Acute swimmer's ear of left side 01/22/2021   Hyperlipidemia 08/31/2020   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HCC)    Dense breast tissue on mammogram 07/29/2017   Fatty liver 03/31/2017   Leg cramps 11/19/2016   Iron deficiency anemia 02/29/2016   Genetic testing 01/25/2016   Family history of breast cancer    Family history of bladder cancer    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   Malignant neoplasm of upper-inner quadrant of right breast in female, estrogen receptor negative (HCC) 10/01/2012   ANXIETY STATE, UNSPECIFIED 07/01/2008   FIBROIDS, UTERUS 06/27/2008   PELVIC  PAIN 06/27/2008   THROMBOCYTOPENIA  03/20/2008   Herpes simplex virus (HSV) infection 02/03/2008   MUSCLE PAIN 02/03/2008    Past Surgical History:  Procedure Laterality Date   BREAST LUMPECTOMY Right 2010   BREAST SURGERY  05/2008   lumpectomy - right   CHOLECYSTECTOMY  05/2008   COLONOSCOPY  08/2009   HERNIA REPAIR  1995   umbilical   HERNIA REPAIR  1996   LAPAROSCOPY     port a cath insertion  06/2008   uterine polyp removal  06/2008    Current Outpatient Medications  Medication Sig Dispense Refill   B Complex Vitamins (B-COMPLEX/B-12 PO) Take 1 tablet by mouth daily.      Biotin 1000 MCG tablet Take 1,000 mcg by mouth   daily.      cholecalciferol (VITAMIN D) 1000 UNITS tablet Take 2,000 Units by mouth daily.     Multiple Vitamin (MULTIVITAMIN) tablet Take 1 tablet by mouth daily.     valACYclovir (VALTREX) 500 MG tablet Take 1 tablet (500 mg total) by mouth daily. 90 tablet 4   DULoxetine (CYMBALTA) 30 MG capsule TAKE ONE CAPSULE BY MOUTH EVERY DAY 90 capsule 3   gabapentin (NEURONTIN) 300 MG capsule TAKE 3 CAPSULES BY MOUTH FOUR TIMES DAILY 1080 capsule 3   No current facility-administered medications for this visit.    Allergies as of 06/21/2021 - Review Complete 06/21/2021  Allergen Reaction Noted   Clarithromycin  05/23/2008   Other  10/29/2018   Penicillins Other (See Comments) 04/06/2007    Vitals: BP 105/76   Pulse 70   Ht 5' 7" (1.702 m)   Wt 194 lb 9.6 oz (88.3 kg)   LMP 05/13/2010   BMI 30.48 kg/m  Last Weight:  Wt Readings from Last 1 Encounters:  06/21/21 194 lb 9.6 oz (88.3 kg)   Last Height:   Ht Readings from Last 1 Encounters:  06/21/21 5' 7" (1.702 m)     Physical exam: Exam: Gen: NAD, conversant, well nourised, well groomed                     CV: RRR, no MRG. No Carotid Bruits. No peripheral edema, warm, nontender Eyes: Conjunctivae clear without exudates or hemorrhage  Neuro: Detailed Neurologic Exam  Speech:    Speech is normal; fluent and spontaneous with normal  comprehension.  Cognition:    The patient is oriented to person, place, and time;     recent and remote memory intact;     language fluent;     normal attention, concentration,     fund of knowledge Cranial Nerves:    The pupils are equal, round, and reactive to light. Pupils too small to visualize fundi. Visual fields are full to finger confrontation. Extraocular movements are intact. Trigeminal sensation is intact and the muscles of mastication are normal. The face is symmetric. The palate elevates in the midline. Hearing intact. Voice is normal. Shoulder shrug is normal. The tongue has normal motion without fasciculations.   Coordination:    Normal finger to nose and heel to shin.   Gait:     normal.   Motor Observation:    No asymmetry, no atrophy, and no involuntary movements noted. Tone:    Normal muscle tone.    Posture:    Posture is normal. normal erect    Strength:    Strength is V/V in the upper and lower limbs.      Sensation: intact to LT, pain, temperature, vibration, cold, but has allodynia     Reflex Exam:  DTR's:    Deep tendon reflexes in the upper and lower extremities are normal bilaterally.   Toes:    The toes are downgoing bilaterally.   Clonus:    Clonus is absent.    Assessment/Plan:  Patient with body paresthesias thought to be paraneoplastic prior to being diagnosed with breast cancer. She is stable. Refill medications. We discussed genetic testing, at this time she is not interested.  Meds ordered this encounter  Medications   gabapentin (NEURONTIN) 300 MG capsule    Sig: TAKE 3 CAPSULES BY MOUTH FOUR TIMES DAILY    Dispense:  1080 capsule    Refill:  3   DULoxetine (CYMBALTA) 30 MG capsule      Sig: TAKE ONE CAPSULE BY MOUTH EVERY DAY    Dispense:  90 capsule    Refill:  3    Please dispense generic    Cc: [name], [name], *,  [name], [name], DO  Vivyan Biggers, MD  [facility] [address] [place],  [address]  Phone [phone] Fax [phone]  I spent over 40 minutes of face-to-face and non-face-to-face time with patient on the  1. 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HCC)   2. Sensory neuronopathy    diagnosis.  This included previsit chart review, lab review, study review, order entry, electronic health record documentation, patient education on the different diagnostic and therapeutic options, counseling and coordination of care, risks and benefits of management, compliance, or risk factor reduction  

## 2022-06-21 LAB — LIPOPROTEIN A (LPA): Lipoprotein (a): <10 nmol/L (ref <75)

## 2022-06-24 ENCOUNTER — Ambulatory Visit: Payer: Managed Care, Other (non HMO) | Admitting: Neurology

## 2022-07-02 ENCOUNTER — Encounter: Payer: Self-pay | Admitting: Obstetrics & Gynecology

## 2022-07-02 ENCOUNTER — Other Ambulatory Visit (HOSPITAL_COMMUNITY)
Admission: RE | Admit: 2022-07-02 | Discharge: 2022-07-02 | Disposition: A | Discharge status: Home / Self Care | Payer: Managed Care, Other (non HMO) | Source: Ambulatory Visit | Attending: Obstetrics & Gynecology | Admitting: Obstetrics & Gynecology

## 2022-07-02 ENCOUNTER — Ambulatory Visit (INDEPENDENT_AMBULATORY_CARE_PROVIDER_SITE_OTHER): Payer: Managed Care, Other (non HMO) | Admitting: Obstetrics & Gynecology

## 2022-07-02 VITALS — BP 122/84 | HR 63 | Ht 67.0 in | Wt 222.0 lb

## 2022-07-02 DIAGNOSIS — Z780 Asymptomatic menopausal state: Secondary | ICD-10-CM

## 2022-07-02 DIAGNOSIS — Z01419 Encounter for gynecological examination (general) (routine) without abnormal findings: Secondary | ICD-10-CM | POA: Insufficient documentation

## 2022-07-02 DIAGNOSIS — C50211 Malignant neoplasm of upper-inner quadrant of right female breast: Secondary | ICD-10-CM | POA: Diagnosis not present

## 2022-07-02 DIAGNOSIS — Z171 Estrogen receptor negative status [ER-]: Secondary | ICD-10-CM

## 2022-07-02 NOTE — Progress Notes (Signed)
Jeanette Boone Dec 31, 1960 [id number]   History:    62 y.o. G1P0A1 Married.  Radiation protection practitioner.  2 adopted children doing well, son 35 and daughter 75 yo.   RP:  Established patient presenting for annual gyn exam    HPI:  Postmenopausal. Experiencing hot flushes/night sweats, no HRT with H/O Rt Breast Ca.  No PMB.  No pelvic pain.  No pain with IC.  Last Pap Neg in 12/2020.  Pap reflex today. 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  Discharged from Oncology.  Last Breast MRI 09/2019.  Repeat MRI now.  Breasts wnl.  Mammo Neg 09/2021.  BMI 34.77. Needs to start back with fitness and better nutrition. On Valacyclovir for oral HSV.  Colonoscopy 08/2021.  BD normal in 2018, will repeat at age 79. Health labs with Fam MD.  Past medical history,surgical history, family history and social history were all reviewed and documented in the EPIC chart.  Gynecologic History Patient's last menstrual period was 05/13/2010.  Obstetric History OB History  Gravida Para Term Preterm AB Living  1 0     1 0  SAB IAB Ectopic Multiple Live Births  1            # Outcome Date GA Lbr Len/2nd Weight Sex Delivery Anes PTL Lv  1 SAB              ROS: A ROS was performed and pertinent positives and negatives are included in the history. GENERAL: No fevers or chills. HEENT: No change in vision, no earache, sore throat or sinus congestion. NECK: No pain or stiffness. CARDIOVASCULAR: No chest pain or pressure. No palpitations. PULMONARY: No shortness of breath, cough or wheeze. GASTROINTESTINAL: No abdominal pain, nausea, vomiting or diarrhea, melena or bright red blood per rectum. GENITOURINARY: No urinary frequency, urgency, hesitancy or dysuria. MUSCULOSKELETAL: No joint or muscle pain, no back pain, no recent trauma. DERMATOLOGIC: No rash, no itching, no lesions.  ENDOCRINE: No polyuria, polydipsia, no heat or cold intolerance. No recent change in weight. HEMATOLOGICAL: No anemia or easy bruising or bleeding. NEUROLOGIC: No headache, seizures, numbness, tingling or weakness. PSYCHIATRIC: No depression, no loss of interest in normal activity or change in sleep pattern.     Exam:   BP 122/84 (BP Location: Right Arm, Patient Position: Sitting, Cuff Size: Large)   Pulse 63   Ht 5\' 7"  (1.702 m)   Wt 222 lb (100.7 kg)   LMP 05/13/2010   SpO2 98%   BMI 34.77 kg/m   Body mass index is 34.77 kg/m.  General appearance : Well developed well nourished female. No acute distress HEENT: Eyes: no retinal hemorrhage or exudates,  Neck supple, trachea midline, no carotid bruits, no thyroidmegaly Lungs: Clear to auscultation, no rhonchi or wheezes, or rib retractions  Heart: Regular rate and rhythm, no murmurs or gallops Breast:Examined in sitting and supine position were symmetrical in appearance, no palpable masses or tenderness,  no skin retraction, no nipple inversion, no nipple discharge, no skin discoloration, no axillary or supraclavicular lymphadenopathy Abdomen: no palpable masses or tenderness, no rebound or guarding Extremities: no edema or skin discoloration or tenderness  Pelvic: Vulva: Normal             Vagina: No gross lesions or discharge  Cervix: No gross lesions or discharge.  Pap reflex done.  Uterus  AV, normal size, shape and consistency, non-tender and mobile  Adnexa  Without masses or tenderness  Anus: Normal   Assessment/Plan:  62 y.o. female for annual exam   1. Encounter for routine gynecological examination with Papanicolaou smear of cervix Postmenopausal. Experiencing hot flushes/night sweats, no HRT with H/O Rt Breast Ca.  No PMB.  No pelvic pain.  No pain with IC.  Last Pap Neg in 12/2020.  Pap reflex today. 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  Discharged from Oncology.  Last Breast MRI 09/2019.  Repeat MRI now.  Breasts wnl.  Mammo Neg 09/2021.  BMI 34.77. Needs to start back with fitness and better nutrition. On Valacyclovir for oral HSV.  Colonoscopy 08/2021.  BD normal in 2018, will repeat at age 68. Health labs with Fam MD. - Cytology - PAP( [facility])  2. Postmenopause Postmenopausal. Experiencing hot flushes/night sweats, no HRT with H/O Rt Breast Ca.  No PMB.  No pelvic pain.  No pain with IC.   3. Malignant neoplasm of upper-inner quadrant of right breast in female, estrogen receptor negative (HCC)    Last Breast MRI 09/2019.  Repeat MRI now.   Genia Del MD, 8:29 AM

## 2022-07-03 ENCOUNTER — Telehealth: Payer: Self-pay

## 2022-07-03 DIAGNOSIS — Z853 Personal history of malignant neoplasm of breast: Secondary | ICD-10-CM

## 2022-07-03 NOTE — Telephone Encounter (Signed)
-----   Message from Genia Del, MD sent at 07/02/2022  8:51 AM EDT ----- Regarding: Schedule a bilateral breast MRI H/O Rt Breast Cancer.  Overdue for annual breast MRI, last was 09/2019.  Please schedule.

## 2022-07-05 LAB — CYTOLOGY - PAP
Comment: NEGATIVE
Diagnosis: NEGATIVE
High risk HPV: NEGATIVE

## 2022-07-22 ENCOUNTER — Other Ambulatory Visit: Payer: Self-pay | Admitting: Neurology

## 2022-07-24 ENCOUNTER — Telehealth: Payer: Self-pay | Admitting: Neurology

## 2022-07-24 NOTE — Telephone Encounter (Signed)
Refills have been sent as a 2 month supply. Pt will be seen again on 09/23/22. She is due for an appt. Hasn't been seen since June 2023.

## 2022-07-24 NOTE — Telephone Encounter (Signed)
Pt stated pharmacy sent a refill request on 7/15 for DULoxetine (CYMBALTA) 30 MG capsule and gabapentin (NEURONTIN) 300 MG capsule. Pt stated she is almost out of medication and needs medication sent to.

## 2022-07-31 NOTE — Telephone Encounter (Signed)
Mychart msg read by pt on 07/17/2022.  Appt scheduled for 08/23/2022--However, cancelled by imaging center: "Room/Resource Maintenance (07/31/22 LMOM-need to rs appt due to change of contrasted study times/BG)"

## 2022-08-02 NOTE — Telephone Encounter (Signed)
MRI appt notes from [facility] [facility]:  "07/31/22 LMOM-need to rs appt due to change of contrasted study times/BG 07/12/22 2ND ATTEMPT LMOM SK 07/04/22 1ST ATTEMPT LMOM SK"

## 2022-08-23 ENCOUNTER — Other Ambulatory Visit: Payer: Managed Care, Other (non HMO)

## 2022-08-23 ENCOUNTER — Ambulatory Visit: Payer: Managed Care, Other (non HMO) | Admitting: Cardiology

## 2022-08-26 NOTE — Telephone Encounter (Signed)
FYI. Former ML pt scheduled for breast MRI on 10/01/2022. Will leave encounter open as reminder to notify PA coordinator to start process within 30 days out from appt date.

## 2022-09-11 NOTE — Telephone Encounter (Signed)
Msg sent to coordinator to initiate PA.

## 2022-09-23 ENCOUNTER — Ambulatory Visit: Payer: Managed Care, Other (non HMO) | Admitting: Neurology

## 2022-09-24 ENCOUNTER — Other Ambulatory Visit: Payer: Self-pay | Admitting: Neurology

## 2022-09-25 NOTE — Telephone Encounter (Signed)
Last seen on 06/21/21 Follow up scheduled on  01/13/23

## 2022-09-30 ENCOUNTER — Encounter: Payer: Self-pay | Admitting: Obstetrics & Gynecology

## 2022-10-01 ENCOUNTER — Other Ambulatory Visit: Payer: Managed Care, Other (non HMO)

## 2022-10-10 NOTE — Telephone Encounter (Signed)
Pt's breast MRI was cancelled and the appt notes state:  "*[employer]-auth pending for medical review, spoke with pt//denajae"  I sent a msg to our in-office PA coordinator to inquire about this.

## 2022-11-11 ENCOUNTER — Encounter: Payer: Self-pay | Admitting: Cardiology

## 2022-11-11 ENCOUNTER — Ambulatory Visit: Payer: Managed Care, Other (non HMO) | Attending: Cardiology | Admitting: Cardiology

## 2022-11-11 VITALS — BP 122/84 | HR 63 | Ht 67.0 in | Wt 232.4 lb

## 2022-11-11 DIAGNOSIS — Z09 Encounter for follow-up examination after completed treatment for conditions other than malignant neoplasm: Secondary | ICD-10-CM | POA: Diagnosis not present

## 2022-11-11 DIAGNOSIS — G549 Nerve root and plexus disorder, unspecified: Secondary | ICD-10-CM

## 2022-11-11 DIAGNOSIS — R072 Precordial pain: Secondary | ICD-10-CM | POA: Diagnosis not present

## 2022-11-11 DIAGNOSIS — Z01812 Encounter for preprocedural laboratory examination: Secondary | ICD-10-CM

## 2022-11-11 DIAGNOSIS — E782 Mixed hyperlipidemia: Secondary | ICD-10-CM

## 2022-11-11 MED ORDER — METOPROLOL TARTRATE 50 MG PO TABS: 50.0000 mg | ORAL_TABLET | ORAL | 0 refills | Status: DC

## 2022-11-11 NOTE — Patient Instructions (Signed)
Medication Instructions:  The current medical regimen is effective;  continue present plan and medications.  *If you need a refill on your cardiac medications before your next appointment, please call your pharmacy*   Lab Work: Please have blood work today (BMP) If you have labs (blood work) drawn today and your tests are completely normal, you will receive your results only by: MyChart Message (if you have MyChart) OR A paper copy in the mail If you have any lab test that is abnormal or we need to change your treatment, we will call you to review the results.   Testing/Procedures:   Your cardiac CT will be scheduled at:   [facility] [address] [place], [place] [address] [phone]  lease arrive at the [facility] and Children's Entrance (Entrance C2) of [facility] 30 minutes prior to test start time. You can use the FREE valet parking offered at entrance C (encouraged to control the heart rate for the test)  Proceed to the [facility] Radiology Department (first floor) to check-in and test prep.  All radiology patients and guests should use entrance C2 at [facility], accessed from [facility], even though the hospital's physical address listed is [address].     Please follow these instructions carefully (unless otherwise directed):  An IV will be required for this test and Nitroglycerin will be given.  Hold all erectile dysfunction medications at least 3 days (72 hrs) prior to test. (Ie viagra, cialis, sildenafil, tadalafil, etc)   On the Night Before the Test: Be sure to Drink plenty of water. Do not consume any caffeinated/decaffeinated beverages or chocolate 12 hours prior to your test. Do not take any antihistamines 12 hours prior to your test.  On the Day of the Test: Drink plenty of water until 1 hour prior to the test. Do not eat any food 1 hour prior to test. You may take your regular medications prior  to the test.  Take metoprolol (Lopressor) two hours prior to test. If you take Furosemide/Hydrochlorothiazide/Spironolactone, please HOLD on the morning of the test. FEMALES- please wear underwire-free bra if available, avoid dresses & tight clothing  After the Test: Drink plenty of water. After receiving IV contrast, you may experience a mild flushed feeling. This is normal. On occasion, you may experience a mild rash up to 24 hours after the test. This is not dangerous. If this occurs, you can take Benadryl 25 mg and increase your fluid intake. If you experience trouble breathing, this can be serious. If it is severe call 911 IMMEDIATELY. If it is mild, please call our office. If you take any of these medications: Glipizide/Metformin, Avandament, Glucavance, please do not take 48 hours after completing test unless otherwise instructed.  We will call to schedule your test 2-4 weeks out understanding that some insurance companies will need an authorization prior to the service being performed.   For more information and frequently asked questions, please visit our website : [email or web address]  For non-scheduling related questions, please contact the cardiac imaging nurse navigator should you have any questions/concerns: Cardiac Imaging Nurse Navigators Direct Office Dial: [phone]   For scheduling needs, including cancellations and rescheduling, please call [place], [phone].   Follow-Up: At [facility], you and your health needs are our priority.  As part of our continuing mission to provide you with exceptional heart care, we have created designated Provider Care Teams.  These Care Teams include your primary Cardiologist (physician) and  Advanced Practice Providers (APPs -  Physician Assistants and Nurse Practitioners) who all work together to provide you with the care you need, when you need it.  We recommend signing up for the patient portal called  "MyChart".  Sign up information is provided on this After Visit Summary.  MyChart is used to connect with patients for Virtual Visits (Telemedicine).  Patients are able to view lab/test results, encounter notes, upcoming appointments, etc.  Non-urgent messages can be sent to your provider as well.   To learn more about what you can do with MyChart, go to [email or web address].    Your next appointment:   Follow up will be based on the results of the above testing.

## 2022-11-11 NOTE — Progress Notes (Signed)
Cardiology Office Note:  .   Date:  11/11/2022  ID:  Jeanette Boone, DOB Nov 15, 1960, MRN [id number] PCP: Zola Button, Grayling Congress, DO  [facility] Providers Cardiologist:  None    History of Present Illness: .   Jeanette Boone is a 62 y.o. female Discussed the use of AI scribe software for clinical note transcription with the patient, who gave verbal consent to proceed.  History of Present Illness   The patient, with a history of breast cancer and sensory neuropathy, presents with chest discomfort and shortness of breath. The discomfort is described as a pressure sensation, which is currently present and has been experienced previously when the patient was heavier. The patient also reports occasional palpitations, described as a fluttering sensation in the chest. The patient has a significant family history of heart disease, with both parents having pacemakers and the father having passed away from congestive heart failure.  The patient has been a yo-yo dieter throughout their life and is currently obese. They have also experienced severe heartburn for about two months, which was managed with Prilosec and has since improved. The patient is on a high dose of gabapentin for sensory neuropathy, which is described as an intense sensation throughout the body.  The patient has a history of breast cancer, diagnosed in 2010, and underwent lumpectomy, chemotherapy, and radiation. They also report a fainting episode in 2020, which led to a CT scan of the chest. The patient does not have a history of hypertension, diabetes, or smoking, but has slightly elevated cholesterol levels. They have not been exercising regularly for the past year due to personal circumstances.  The patient has not had any new medications added to their regimen recently. They deny any new symptoms or changes in their health since their last visit.           Studies Reviewed: Marland Kitchen   EKG Interpretation Date/Time:  Monday  November 11 2022 15:24:53 EST Ventricular Rate:  63 PR Interval:  188 QRS Duration:  72 QT Interval:  406 QTC Calculation: 415 R Axis:   -5  Text Interpretation: Normal sinus rhythm Low voltage QRS Poor R wave progression When compared with ECG of 12-Dec-2018 20:37, No significant change since last tracing Confirmed by Donato Schultz ([id number]) on 11/11/2022 3:45:21 PM    LDL 146  Risk Assessment/Calculations:            Physical Exam:   VS:  BP 122/84   Pulse 63   Ht 5\' 7"  (1.702 m)   Wt 232 lb 6.4 oz (105.4 kg)   LMP 05/13/2010   SpO2 98%   BMI 36.40 kg/m    Wt Readings from Last 3 Encounters:  11/11/22 232 lb 6.4 oz (105.4 kg)  07/02/22 222 lb (100.7 kg)  06/14/22 227 lb 3.2 oz (103.1 kg)    GEN: Well nourished, well developed in no acute distress NECK: No JVD; No carotid bruits CARDIAC: RRR, no murmurs, no rubs, no gallops RESPIRATORY:  Clear to auscultation without rales, wheezing or rhonchi  ABDOMEN: Soft, non-tender, non-distended EXTREMITIES:  No edema; No deformity   ASSESSMENT AND PLAN: .       Chest Pain Patient reports chest pressure and shortness of breath. Family history of heart disease. Recent EKG showed low voltage, possibly due to increased chest wall thickness. Normal echocardiogram in 2022. No new murmurs on auscultation. -Order coronary CT scan to assess for coronary artery disease.  Gastroesophageal Reflux Disease (GERD) Patient reports history of  severe heartburn, recently improved with a course of Prilosec. -Continue current management.  Hyperlipidemia Total cholesterol 240, LDL 146. Patient reports family history of heart disease. -Monitor cholesterol levels. Consider lipid-lowering therapy if levels continue to rise.  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 -Continue current management with gabapentin.  Breast Cancer Patient reports history of breast cancer,  currently in remission. -Continue regular follow-up and screenings as per oncology.  Obesity Patient self-reports being "extremely obese" and has a history of "yo-yo dieting". -Encourage healthy diet and regular exercise. Consider referral to a dietitian or weight management program.  Palpitations Patient reports occasional palpitations, particularly at night. No evidence of atrial fibrillation on EKG. -Consider further monitoring if symptoms increase or persist.  Follow-up Review results of coronary CT scan.              Signed, Donato Schultz, MD

## 2022-11-12 LAB — BASIC METABOLIC PANEL
BUN/Creatinine Ratio: 14 (ref 12–28)
BUN: 13 mg/dL (ref 8–27)
CO2: 25 mmol/L (ref 20–29)
Calcium: 9.5 mg/dL (ref 8.7–10.3)
Chloride: 100 mmol/L (ref 96–106)
Creatinine, Ser: 0.92 mg/dL (ref 0.57–1.00)
Glucose: 129 mg/dL — ABNORMAL HIGH (ref 70–99)
Potassium: 4.4 mmol/L (ref 3.5–5.2)
Sodium: 138 mmol/L (ref 134–144)
eGFR: 71 mL/min/{1.73_m2} (ref >59)

## 2022-11-15 ENCOUNTER — Other Ambulatory Visit: Payer: Self-pay | Admitting: Family Medicine

## 2022-11-15 DIAGNOSIS — Z1231 Encounter for screening mammogram for malignant neoplasm of breast: Secondary | ICD-10-CM

## 2022-11-19 ENCOUNTER — Ambulatory Visit
Admission: RE | Admit: 2022-11-19 | Discharge: 2022-11-19 | Disposition: A | Discharge status: Home / Self Care | Payer: Managed Care, Other (non HMO) | Source: Ambulatory Visit | Attending: Family Medicine | Admitting: Family Medicine

## 2022-11-19 DIAGNOSIS — Z1231 Encounter for screening mammogram for malignant neoplasm of breast: Secondary | ICD-10-CM

## 2022-11-20 NOTE — Telephone Encounter (Signed)
Breast MRI now scheduled for 12/29/22. Routing to provider for final review and closing encounter.

## 2022-11-27 ENCOUNTER — Telehealth (HOSPITAL_COMMUNITY): Payer: Self-pay | Admitting: *Deleted

## 2022-11-27 NOTE — Telephone Encounter (Signed)
Reaching out to patient to offer assistance regarding upcoming cardiac imaging study; pt verbalizes understanding of appt date/time, parking situation and where to check in, pre-test NPO status and medications ordered, and verified current allergies; name and call back number provided for further questions should they arise  Larey Brick RN Navigator Cardiac Imaging [facility] [phone] office [phone] cell  Patient to take 50mg  metoprolol tartrate if her HR is greater than 65 bpm. She is aware to arrive at 2:30 PM.

## 2022-11-28 ENCOUNTER — Ambulatory Visit (HOSPITAL_COMMUNITY)
Admission: RE | Admit: 2022-11-28 | Discharge: 2022-11-28 | Disposition: A | Discharge status: Home / Self Care | Payer: Managed Care, Other (non HMO) | Source: Ambulatory Visit | Attending: Cardiology | Admitting: Cardiology

## 2022-11-28 DIAGNOSIS — R072 Precordial pain: Secondary | ICD-10-CM | POA: Insufficient documentation

## 2022-11-28 MED ORDER — IOHEXOL 350 MG/ML SOLN
100.0000 mL | Freq: Once | INTRAVENOUS | Status: AC | PRN
  Administered 2022-11-28: 100 mL via INTRAVENOUS

## 2022-11-28 MED ORDER — NITROGLYCERIN 0.4 MG SL SUBL
0.8000 mg | SUBLINGUAL_TABLET | SUBLINGUAL | Status: DC | PRN
  Administered 2022-11-28: 0.8 mg via SUBLINGUAL

## 2022-11-28 MED ORDER — NITROGLYCERIN 0.4 MG SL SUBL
SUBLINGUAL_TABLET | SUBLINGUAL | Status: AC
Start: 2022-11-28 — End: ?
  Filled 2022-11-28: qty 2

## 2022-12-04 ENCOUNTER — Telehealth: Payer: Self-pay | Admitting: *Deleted

## 2022-12-04 ENCOUNTER — Other Ambulatory Visit: Payer: Self-pay | Admitting: *Deleted

## 2022-12-04 DIAGNOSIS — E782 Mixed hyperlipidemia: Secondary | ICD-10-CM

## 2022-12-04 DIAGNOSIS — Z79899 Other long term (current) drug therapy: Secondary | ICD-10-CM

## 2022-12-04 MED ORDER — ROSUVASTATIN CALCIUM 10 MG PO TABS: 10.0000 mg | ORAL_TABLET | Freq: Every day | ORAL | 3 refills | Status: DC

## 2022-12-04 NOTE — Telephone Encounter (Signed)
Overall reassuring coronary CT scan however there is a mild amount of soft plaque recognized but this is nonobstructive.   In addition to 30 minutes of exercise daily as well as Mediterranean diet, I would suggest starting Crestor 10 mg a day.  Check lipid panel in 2 months.  LDL goal should be less than 70.   Pt is aware of results and need to start Crestor 10 mg daily.  Rx sent into [facility] as requested.  Lab will be completed in 2 months at [facility]

## 2022-12-29 ENCOUNTER — Ambulatory Visit
Admission: RE | Admit: 2022-12-29 | Discharge: 2022-12-29 | Disposition: A | Discharge status: Home / Self Care | Payer: Managed Care, Other (non HMO) | Source: Ambulatory Visit | Attending: Obstetrics & Gynecology | Admitting: Obstetrics & Gynecology

## 2022-12-29 DIAGNOSIS — Z853 Personal history of malignant neoplasm of breast: Secondary | ICD-10-CM

## 2022-12-29 MED ORDER — GADOPICLENOL 0.5 MMOL/ML IV SOLN
10.0000 mL | Freq: Once | INTRAVENOUS | Status: AC | PRN
  Administered 2022-12-29: 10 mL via INTRAVENOUS

## 2023-01-12 ENCOUNTER — Telehealth: Payer: Self-pay | Admitting: Neurology

## 2023-01-12 NOTE — Telephone Encounter (Signed)
 Pt was called to be informed that due to inclement weather, their appt has been cx and they will be called back tomorrow to r/s.

## 2023-01-13 ENCOUNTER — Ambulatory Visit: Payer: Managed Care, Other (non HMO) | Admitting: Neurology

## 2023-01-13 NOTE — Telephone Encounter (Signed)
 Called pt and LVM with office number asking for call back to reschedule her appointment from today.

## 2023-01-13 NOTE — Telephone Encounter (Signed)
 Pt returning call. Reschedule

## 2023-01-14 ENCOUNTER — Ambulatory Visit (INDEPENDENT_AMBULATORY_CARE_PROVIDER_SITE_OTHER): Payer: Managed Care, Other (non HMO) | Admitting: Neurology

## 2023-01-14 ENCOUNTER — Encounter: Payer: Self-pay | Admitting: Neurology

## 2023-01-14 VITALS — BP 122/78 | HR 64 | Ht 67.0 in | Wt 222.0 lb

## 2023-01-14 DIAGNOSIS — G549 Nerve root and plexus disorder, unspecified: Secondary | ICD-10-CM | POA: Diagnosis not present

## 2023-01-14 DIAGNOSIS — G130 Paraneoplastic neuromyopathy and neuropathy: Secondary | ICD-10-CM

## 2023-01-14 DIAGNOSIS — D499 Neoplasm of unspecified behavior of unspecified site: Secondary | ICD-10-CM

## 2023-01-14 MED ORDER — DULOXETINE HCL 30 MG PO CPEP: ORAL_CAPSULE | ORAL | 4 refills | Status: DC

## 2023-01-14 MED ORDER — GABAPENTIN 300 MG PO CAPS: ORAL_CAPSULE | ORAL | 4 refills | Status: DC

## 2023-01-14 NOTE — Progress Notes (Signed)
 [facility]    Provider:  Dr Ines Requesting Provider: Antonio Meth, Jamee SAUNDERS, * Primary Care Provider:  Antonio Meth, Jamee SAUNDERS, DO  CC:  sensory neuropathy  She can often feel symptoms in strange places like her ear canal, eye lids but she is stable. She was extensively tested by Dr. Maurice and Dr. Jenel. She has been stable for years. Still in her scalp, her feet, her back her genital area, still has pain, the gabapentin  helps but if she forgets she can really tell. Some days worse than others. Strange places such as the ear lobes, then pins and needles, some pulsing then the pins and needles. She cannot use the vibration tool to clean her teeth. She still has burning in her finger tips and toes. She has been on Gabapentin , we discussed Lyrica but she is doing well on gabapentin .  Patient complains of symptoms per HPI as well as the following symptoms: none . Pertinent negatives and positives per HPI. All others negative   HPI:  Jeanette Boone is a 63 y.o. female here as requested by Antonio Meth, Jamee SAUNDERS, * for neuropathy. She has a PMHx of breast cancer and she is transitioning to me since Dr. Jenel retired.  Last seen 04/19/2020 by Dr. Jenel.  I reviewed Dr. Rich notes, the patient developed a sensory neuropathy, chronic symptoms, she had remained stable from the prior year, remaining on high-dose gabapentin  900 mg 4 times a day and 30 mg of Cymbalta .  She denies any balance issues.  She is able to continue working on a regular basis.  Dr. Jenel refilled medications and suggested increasing her Cymbalta  but she wished to remain on the current regiment.  She is here alone, ongoing > 10 years she has been a patient since Dr. Maurice, when Dr. Maurice retired she saw Dr. Jenel. The neuropathy started in one foot and progressed throughout her body, she has burning, numbness, sharp tingling and needles and a pulsing from head to toe. It started in her right foot and over the  next few months it spread, thought to be a paraneoplastic process. She has been extensively worked up, sensory polyneuropathy. She has it all over and in her whole scalp. All over her body, sensory neuropathy. No falls or significant imbalance. Caffeine makes it worse. The medications help. She is stable over the last year. She takes a high dose of Gabapentin . Chemo made it worse but after chemo   Reviewed notes, labs and imaging from outside physicians, which showed:  MRI brain 07/2018: IMPRESSION: This MRI of the brain with and without contrast shows the following: 1.   Scattered T2/flair hyperintense foci in the subcortical and deep white matter.  This is a nonspecific finding but is most consistent with chronic microvascular ischemic change or the sequela of migraine headaches.  These are less consistent with demyelination.  The number of foci has progressed compared to the 2010 MRI. 2.   There are no acute findings and there is a normal enhancement pattern.  11/22/2013:  MRI cervical spine IMPRESSION:  Abnormal MRI cervical spine (with and without) demonstrating: 1. At C5-6: disc bulging and uncovertebral joint hypertrophy with severe right foraminal stenosis. 2. At C3-4: uncovertebral joint and facet hypertrophy with mild right foraminal stenosis. 3. At C6-7: left uncovertebral joint hypertrophy with mild left foraminal stenosis. 4. No intrinsic or abnormal enhancing spinal cord lesions. 5. Compared to MRI on 05/26/08, there is mild progression of disc disease at C5-6, C3-4,  C6-7.     In May 2020 Dr. Jenel ordered lab work including RPR, sed rate, HIV, serum copper , angiotensin-converting enzyme, ANA, Lyme antibodies, multiple myeloma panel and she had already been tested for vvitamin D, hemoglobin A1c, CMP, B12, TSH, CBC a few months prior.  B12 was normal at 433.  All blood work were unremarkable.  Review of Systems: Patient complains of symptoms per HPI as well as the following symptoms  hip pain. Pertinent negatives and positives per HPI. All others negative.   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    Employer: [employer]    Comment: Full Time  Tobacco Use   Smoking status: Former   Smokeless tobacco: Never  Advertising Account Planner   Vaping status: Never Used  Substance and Sexual Activity   Alcohol use: Not Currently    Comment: 0   Drug use: Never   Sexual activity: Yes    Partners: Male    Comment: refused to answer insurance questions  Other Topics Concern   Not on file  Social History Narrative   Lives with husband and son   Patient is right handed.   Patient drinks 4-5 cups caffeine daily   Walking some    Social Drivers of Health   Financial Resource Strain: Not on file  Food Insecurity: Not on file  Transportation Needs: Not on file  Physical Activity: Not on file  Stress: Not on file  Social Connections: Not on file  Intimate Partner Violence: Not on file    Family History  Problem Relation Age of Onset   Hypertension Mother    Heart disease Father        pacemaker----tachycardia   Congestive Heart Failure Father 41   Stroke Maternal Grandmother    Bladder Cancer Maternal Grandmother        dx in her 72s   Heart disease Maternal Grandfather 67       MI   Stroke Maternal Grandfather    Heart disease Paternal Grandfather 16       MI   Breast cancer Other        MGF's sister    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   Thrombocytopenia, secondary    secondary to valtrex     Patient Active  Problem List   Diagnosis Date Noted   SOB (shortness of breath) 06/14/2022   Chest pain 06/14/2022   Cold sore 06/14/2022   Vitamin D  deficiency 07/23/2021   Other neutropenia (HCC) 05/25/2021   Morbid obesity (HCC) 05/25/2021   Impacted cerumen of left ear 01/22/2021   Acute swimmer's ear of left side 01/22/2021   Hyperlipidemia 08/31/2020   Paraneoplastic myelitis (HCC)    Dense breast tissue on mammogram 07/29/2017   Fatty liver 03/31/2017   Leg cramps 11/19/2016   Iron deficiency anemia 02/29/2016   Preventative health care 01/25/2016   Family history of breast cancer    Family history of bladder  cancer    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   Malignant neoplasm of upper-inner quadrant of right breast in female, estrogen receptor negative (HCC) 10/01/2012   ANXIETY STATE, UNSPECIFIED 07/01/2008   FIBROIDS, UTERUS 06/27/2008   PELVIC  PAIN 06/27/2008   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   Herpes simplex virus (HSV) infection 02/03/2008   MUSCLE PAIN 02/03/2008    Past Surgical History:  Procedure Laterality Date   BREAST LUMPECTOMY Right 2010   BREAST SURGERY  05/2008   lumpectomy - right   CHOLECYSTECTOMY  05/2008   COLONOSCOPY  08/2009   HERNIA REPAIR  1995   umbilical   HERNIA REPAIR  1996   LAPAROSCOPY     port a cath insertion  06/2008   uterine polyp removal  06/2008    Current Outpatient Medications  Medication Sig Dispense Refill   B Complex Vitamins (B-COMPLEX/B-12 PO) Take 1 tablet by mouth daily.      Biotin 1000 MCG tablet Take 1,000 mcg by mouth daily.      cholecalciferol (VITAMIN D ) 1000 UNITS tablet Take 2,000 Units by mouth daily.     DULoxetine  (CYMBALTA ) 30 MG capsule TAKE 1 CAPSULE BY MOUTH EVERY DAY 90 capsule 4   gabapentin  (NEURONTIN ) 300 MG capsule TAKE 3 CAPSULES BY MOUTH FOUR TIMES DAILY 1080 capsule 4   Multiple Vitamin (MULTIVITAMIN) tablet Take 1 tablet by mouth daily.     rosuvastatin  (CRESTOR ) 10 MG tablet Take 1 tablet (10 mg total) by mouth  daily. 90 tablet 3   valACYclovir  (VALTREX ) 500 MG tablet Take 1 tablet (500 mg total) by mouth daily. Needs office visit for further refills. 180 tablet 3   No current facility-administered medications for this visit.    Allergies as of 01/14/2023 - Review Complete 01/14/2023  Allergen Reaction Noted   Clarithromycin  05/23/2008   Other  10/29/2018   Penicillins Other (See Comments) 04/06/2007    Vitals: BP 122/78 (Cuff Size: Large)   Pulse 64   Ht 5' 7 (1.702 m)   Wt 222 lb (100.7 kg)   LMP 05/13/2010   BMI 34.77 kg/m  Last Weight:  Wt Readings from Last 1 Encounters:  01/14/23 222 lb (100.7 kg)   Last Height:   Ht Readings from Last 1 Encounters:  01/14/23 5' 7 (1.702 m)    Exam: NAD, pleasant                  Speech:    Speech is normal; fluent and spontaneous with normal comprehension.  Cognition:    The patient is oriented to person, place, and time;     recent and remote memory intact;     language fluent;    Cranial Nerves:    The pupils are equal, round, and reactive to light.Trigeminal sensation is intact and the muscles of mastication are normal. The face is symmetric. The palate elevates in the midline. Hearing intact. Voice is normal. Shoulder shrug is normal. The tongue has normal motion without fasciculations.   Coordination:  No dysmetria  Motor Observation:    No asymmetry, no atrophy, and no involuntary movements noted. Tone:    Normal muscle tone.     Strength:    Strength is V/V in the upper and lower limbs.      Sensation: intact to LT, pin prick, temp and vibration   Reflexes: normal    Assessment/Plan:  Patient with body paresthesias thought to be paraneoplastic prior to being diagnosed with breast cancer. The patient has a history of  breast cancer, diagnosed in 2010, and underwent lumpectomy, chemotherapy, and radiatio. She is stable. Refill medications. We discussed genetic testing,  repeating emg/ncs, repeating MRI of the brain w/wo  contrast or MRi cervical spine, she has had extensive serum testing multiple times, at this time she is not interested. 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  She has been on Gabapentin , we discussed Lyrica but she is doing well on gabapentin .She feels her ear canals affected and tinnitus for 15 years, recommend seeing ENT and getting a hearing test, discuss with pcp. Just saw cardiology with evaluation and tx of HLD, would consider following with oncology and/or regular checkups and screening with primary care, weight loss can be associated with neuropathy recommend healthy diet.  F/u one year.   Meds ordered this encounter  Medications   gabapentin  (NEURONTIN ) 300 MG capsule    Sig: TAKE 3 CAPSULES BY MOUTH FOUR TIMES DAILY    Dispense:  1080 capsule    Refill:  4   DULoxetine  (CYMBALTA ) 30 MG capsule    Sig: TAKE 1 CAPSULE BY MOUTH EVERY DAY    Dispense:  90 capsule    Refill:  4    Cc: Antonio Cyndee Jamee JONELLE, *,  Antonio Cyndee Jamee JONELLE, DO  Onetha Epp, MD  [facility] [address] [place], [place] [address]  Phone [phone] Fax [phone]  I spent over 30 minutes of face-to-face and non-face-to-face time with patient on the  1. Sensory neuronopathy   2. 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HCC)     diagnosis.  This included previsit chart review, lab review, study review, order entry, electronic health record documentation, patient education on the different diagnostic and therapeutic options, counseling and coordination of care, risks and benefits of management, compliance, or risk factor reduction

## 2023-01-15 NOTE — Addendum Note (Signed)
 Addended by: Naomie Dean B on: 01/15/2023 12:35 PM   Modules accepted: Level of Service

## 2023-04-20 ENCOUNTER — Other Ambulatory Visit: Payer: Self-pay | Admitting: Neurology

## 2023-04-23 ENCOUNTER — Other Ambulatory Visit: Payer: Self-pay

## 2023-05-23 LAB — LIPID PANEL
Chol/HDL Ratio: 2.3 ratio (ref 0.0–4.4)
Cholesterol, Total: 170 mg/dL (ref 100–199)
HDL: 74 mg/dL (ref >39)
LDL Chol Calc (NIH): 75 mg/dL (ref 0–99)
Triglycerides: 118 mg/dL (ref 0–149)
VLDL Cholesterol Cal: 21 mg/dL (ref 5–40)

## 2023-05-26 ENCOUNTER — Ambulatory Visit: Payer: Self-pay | Admitting: Cardiology

## 2023-06-22 ENCOUNTER — Other Ambulatory Visit: Payer: Self-pay | Admitting: Family Medicine

## 2023-06-22 DIAGNOSIS — B001 Herpesviral vesicular dermatitis: Secondary | ICD-10-CM

## 2023-07-04 ENCOUNTER — Ambulatory Visit (INDEPENDENT_AMBULATORY_CARE_PROVIDER_SITE_OTHER): Admitting: Family Medicine

## 2023-07-04 ENCOUNTER — Encounter: Payer: Self-pay | Admitting: Family Medicine

## 2023-07-04 VITALS — BP 108/80 | HR 53 | Temp 97.6°F | Resp 18 | Ht 67.0 in | Wt 218.8 lb

## 2023-07-04 DIAGNOSIS — E559 Vitamin D deficiency, unspecified: Secondary | ICD-10-CM

## 2023-07-04 DIAGNOSIS — E785 Hyperlipidemia, unspecified: Secondary | ICD-10-CM

## 2023-07-04 DIAGNOSIS — D499 Neoplasm of unspecified behavior of unspecified site: Secondary | ICD-10-CM

## 2023-07-04 DIAGNOSIS — G130 Paraneoplastic neuromyopathy and neuropathy: Secondary | ICD-10-CM

## 2023-07-04 DIAGNOSIS — E538 Deficiency of other specified B group vitamins: Secondary | ICD-10-CM | POA: Diagnosis not present

## 2023-07-04 DIAGNOSIS — B001 Herpesviral vesicular dermatitis: Secondary | ICD-10-CM

## 2023-07-04 DIAGNOSIS — D500 Iron deficiency anemia secondary to blood loss (chronic): Secondary | ICD-10-CM | POA: Diagnosis not present

## 2023-07-04 DIAGNOSIS — Z0000 Encounter for general adult medical examination without abnormal findings: Secondary | ICD-10-CM | POA: Diagnosis not present

## 2023-07-04 LAB — VITAMIN D 25 HYDROXY (VIT D DEFICIENCY, FRACTURES): VITD: 56.43 ng/mL (ref 30.00–100.00)

## 2023-07-04 LAB — COMPREHENSIVE METABOLIC PANEL WITH GFR
ALT: 20 U/L (ref 0–35)
AST: 23 U/L (ref 0–37)
Albumin: 4.4 g/dL (ref 3.5–5.2)
Alkaline Phosphatase: 104 U/L (ref 39–117)
BUN: 10 mg/dL (ref 6–23)
CO2: 30 meq/L (ref 19–32)
Calcium: 9.9 mg/dL (ref 8.4–10.5)
Chloride: 106 meq/L (ref 96–112)
Creatinine, Ser: 0.91 mg/dL (ref 0.40–1.20)
GFR: 67.76 mL/min (ref >60.00)
Glucose, Bld: 95 mg/dL (ref 70–99)
Potassium: 5.1 meq/L (ref 3.5–5.1)
Sodium: 140 meq/L (ref 135–145)
Total Bilirubin: 0.6 mg/dL (ref 0.2–1.2)
Total Protein: 7 g/dL (ref 6.0–8.3)

## 2023-07-04 LAB — LIPID PANEL
Cholesterol: 127 mg/dL (ref 0–200)
HDL: 59.5 mg/dL (ref >39.00)
LDL Cholesterol: 51 mg/dL (ref 0–99)
NonHDL: 67.13
Total CHOL/HDL Ratio: 2
Triglycerides: 81 mg/dL (ref 0.0–149.0)
VLDL: 16.2 mg/dL (ref 0.0–40.0)

## 2023-07-04 LAB — CBC WITH DIFFERENTIAL/PLATELET
Basophils Absolute: 0 10*3/uL (ref 0.0–0.1)
Basophils Relative: 0.8 % (ref 0.0–3.0)
Eosinophils Absolute: 0.1 10*3/uL (ref 0.0–0.7)
Eosinophils Relative: 3 % (ref 0.0–5.0)
HCT: 42.8 % (ref 36.0–46.0)
Hemoglobin: 14.2 g/dL (ref 12.0–15.0)
Lymphocytes Relative: 29.9 % (ref 12.0–46.0)
Lymphs Abs: 1.3 10*3/uL (ref 0.7–4.0)
MCHC: 33.2 g/dL (ref 30.0–36.0)
MCV: 95.9 fl (ref 78.0–100.0)
Monocytes Absolute: 0.4 10*3/uL (ref 0.1–1.0)
Monocytes Relative: 8 % (ref 3.0–12.0)
Neutro Abs: 2.6 10*3/uL (ref 1.4–7.7)
Neutrophils Relative %: 58.3 % (ref 43.0–77.0)
Platelets: 171 10*3/uL (ref 150.0–400.0)
RBC: 4.46 Mil/uL (ref 3.87–5.11)
RDW: 12.9 % (ref 11.5–15.5)
WBC: 4.4 10*3/uL (ref 4.0–10.5)

## 2023-07-04 LAB — VITAMIN B12: Vitamin B-12: 558 pg/mL (ref 211–911)

## 2023-07-04 LAB — TSH: TSH: 2.43 u[IU]/mL (ref 0.35–5.50)

## 2023-07-04 MED ORDER — VALACYCLOVIR HCL 500 MG PO TABS: 500.0000 mg | ORAL_TABLET | Freq: Every day | ORAL | 3 refills | Status: AC

## 2023-07-04 NOTE — Assessment & Plan Note (Signed)
 Health Maintenance  Topic Date Due   COVID-19 Vaccine (4 - 2024-25 season) 07/20/2023 (Originally 09/08/2022)   Pneumococcal Vaccine 19-62 Years old (2 of 2 - PCV) 07/03/2024 (Originally 05/12/2015)   INFLUENZA VACCINE  08/08/2023   MAMMOGRAM  12/29/2023   Cervical Cancer Screening (HPV/Pap Cotest)  07/02/2027   DTaP/Tdap/Td (3 - Td or Tdap) 10/29/2028   Colonoscopy  08/28/2031   Hepatitis C Screening  Completed   HIV Screening  Completed   Zoster Vaccines- Shingrix  Completed   Hepatitis B Vaccines  Aged Out   HPV VACCINES  Aged Out   Meningococcal B Vaccine  Aged Out   Ghm utd Check labs  See AVS

## 2023-07-04 NOTE — Progress Notes (Signed)
 Established Patient Office Visit  Subjective   Patient ID: Jeanette Boone, female    DOB: November 24, 1960  Age: 63 y.o. MRN: [id number]  Chief Complaint  Patient presents with   Annual Exam    Pt states fasting     HPI Discussed the use of AI scribe software for clinical note transcription with the patient, who gave verbal consent to proceed.  History of Present Illness Jeanette Boone is a 63 year old female who presents for an annual physical exam and medication refill.  She is currently taking valacyclovir  and requires a refill, which she obtains from [facility]. She is also on Crestor , which was prescribed in November. Since starting Crestor , she has experienced severe joint pain in her wrists, ankles, tops of her feet, and grip. The pain began approximately two months after starting the medication. Despite the pain, Crestor  has significantly lowered her LDL cholesterol from 146 to 75. She is attempting to manage her cholesterol through weight loss and exercise, which she started a month ago.  She reports that her skin does not heal properly, particularly the outer layer, which does not regenerate after cuts or scrapes. This issue has persisted for a year or two. She describes that the underlying skin heals, but the outer layer remains scarred or indented.  In terms of family history, her mother, aged 52, has congestive heart failure and arthritis, and her father died from congestive heart failure.  No permanent damage from the joint pain. She has experienced diarrhea in the past, which resolved with Crestor , but now experiences constipation.   Patient Active Problem List   Diagnosis Date Noted   SOB (shortness of breath) 06/14/2022   Chest pain 06/14/2022   Cold sore 06/14/2022   Vitamin D  deficiency 07/23/2021   Other neutropenia (HCC) 05/25/2021   Morbid obesity (HCC) 05/25/2021   Impacted cerumen of left ear 01/22/2021   Acute swimmer's ear of left side 01/22/2021    Hyperlipidemia 08/31/2020   Paraneoplastic myelitis (HCC)    Dense breast tissue on mammogram 07/29/2017   Fatty liver 03/31/2017   Leg cramps 11/19/2016   Iron deficiency anemia 02/29/2016   Preventative health care 01/25/2016   Family history of breast cancer    Family history of bladder cancer    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   Malignant neoplasm of upper-inner quadrant of right breast in female, estrogen receptor negative (HCC) 10/01/2012   ANXIETY STATE, UNSPECIFIED 07/01/2008   FIBROIDS, UTERUS 06/27/2008   PELVIC  PAIN 06/27/2008   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   Herpes simplex virus (HSV) infection 02/03/2008   MUSCLE PAIN 02/03/2008   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   Thrombocytopenia, secondary    secondary to valtrex    Past Surgical History:  Procedure Laterality Date   BREAST LUMPECTOMY Right 2010   BREAST SURGERY  05/2008   lumpectomy - right   CHOLECYSTECTOMY  05/2008   COLONOSCOPY  08/2009   HERNIA REPAIR  1995   umbilical   HERNIA REPAIR  1996   LAPAROSCOPY     port a cath insertion  06/2008   uterine polyp removal  06/2008   Social History   Tobacco  Use   Smoking status: Former   Smokeless tobacco: Never  Vaping Use   Vaping status: Never Used  Substance Use Topics   Alcohol use: Not Currently    Comment: 0   Drug use: Never   Social History   Socioeconomic History   Marital status: Married    Spouse name: donald   Number of children: 0   Years of education: college   Highest education level: Not on file  Occupational History   Occupation: Civil engineer, 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    Employer: [employer]    Comment: Full Time  Tobacco Use   Smoking status: Former   Smokeless tobacco: Never  Advertising account planner    Vaping status: Never Used  Substance and Sexual Activity   Alcohol use: Not Currently    Comment: 0   Drug use: Never   Sexual activity: Yes    Partners: Male    Comment: refused to answer insurance questions  Other Topics Concern   Not on file  Social History Narrative   Lives with husband and son   Patient is right handed.   Patient drinks 4-5 cups caffeine daily   Walking some    Social Drivers of Corporate investment banker Strain: Not on file  Food Insecurity: Not on file  Transportation Needs: Not on file  Physical Activity: Not on file  Stress: Not on file  Social Connections: Not on file  Intimate Partner Violence: Not on file   Family Status  Relation Name Status   Mother  Alive   Father  Deceased at age 50       chf   Sister  Alive   Brother  Alive   Brother  Alive   MGM  Deceased   MGF  Deceased at age 44       stroke   PGM  Deceased   PGF  Deceased   Other great aunt Deceased  No partnership data on file   Family History  Problem Relation Age of Onset   Congestive Heart Failure Mother    Hypertension Mother    Heart disease Father        pacemaker----tachycardia   Congestive Heart Failure Father 15   Stroke Maternal Grandmother    Bladder Cancer Maternal Grandmother        dx in her 44s   Heart disease Maternal Grandfather 71       MI   Stroke Maternal Grandfather    Heart disease Paternal Grandfather 57       MI   Breast cancer Other        MGF's sister   Allergies  Allergen Reactions   Clarithromycin     Thrush   Other     Steroids---Eye pressure   Penicillins Other (See Comments)    Child hood Did it involve swelling of the face/tongue/throat, SOB, or low BP? Unknown Did it involve sudden or severe rash/hives, skin peeling, or any reaction on the inside of your mouth or nose? Unknown Did you need to seek medical attention at a hospital or doctor's office? Unknown When did it last happen?       If all above answers are "NO", may  proceed with cephalosporin use.        Review of Systems  Constitutional:  Negative for fever and malaise/fatigue.  HENT:  Negative for congestion.   Eyes:  Negative for blurred vision.  Respiratory:  Negative for cough and shortness of breath.   Cardiovascular:  Negative for chest pain, palpitations and leg swelling.  Gastrointestinal:  Negative for abdominal pain, blood in stool, nausea and vomiting.  Genitourinary:  Negative for dysuria and frequency.  Musculoskeletal:  Positive for myalgias. Negative for back pain and falls.  Skin:  Negative for rash.  Neurological:  Negative for dizziness, loss of consciousness and headaches.  Endo/Heme/Allergies:  Negative for environmental allergies. Does not bruise/bleed easily.  Psychiatric/Behavioral:  Negative for depression. The patient is not nervous/anxious.       Objective:     BP 108/80 (BP Location: Left Arm, Patient Position: Sitting, Cuff Size: Large)   Pulse (!) 53   Temp 97.6 F (36.4 C) (Oral)   Resp 18   Ht 5' 7 (1.702 m)   Wt 218 lb 12.8 oz (99.2 kg)   LMP 05/13/2010   SpO2 97%   BMI 34.27 kg/m  BP Readings from Last 3 Encounters:  07/04/23 108/80  01/14/23 122/78  11/28/22 108/60   Wt Readings from Last 3 Encounters:  07/04/23 218 lb 12.8 oz (99.2 kg)  01/14/23 222 lb (100.7 kg)  11/11/22 232 lb 6.4 oz (105.4 kg)   SpO2 Readings from Last 3 Encounters:  07/04/23 97%  11/11/22 98%  07/02/22 98%      Physical Exam Vitals and nursing note reviewed.  Constitutional:      General: She is not in acute distress.    Appearance: Normal appearance. She is well-developed.  HENT:     Head: Normocephalic and atraumatic.     Right Ear: Tympanic membrane, ear canal and external ear normal. There is no impacted cerumen.     Left Ear: Tympanic membrane, ear canal and external ear normal. There is no impacted cerumen.     Nose: Nose normal.     Mouth/Throat:     Mouth: Mucous membranes are moist.     Pharynx:  Oropharynx is clear. No oropharyngeal exudate or posterior oropharyngeal erythema.   Eyes:     General: No scleral icterus.       Right eye: No discharge.        Left eye: No discharge.     Conjunctiva/sclera: Conjunctivae normal.     Pupils: Pupils are equal, round, and reactive to light.   Neck:     Thyroid : No thyromegaly or thyroid  tenderness.     Vascular: No JVD.   Cardiovascular:     Rate and Rhythm: Normal rate and regular rhythm.     Heart sounds: Normal heart sounds. No murmur heard. Pulmonary:     Effort: Pulmonary effort is normal. No respiratory distress.     Breath sounds: Normal breath sounds.  Abdominal:     General: Bowel sounds are normal. There is no distension.     Palpations: Abdomen is soft. There is no mass.     Tenderness: There is no abdominal tenderness. There is no guarding or rebound.   Musculoskeletal:        General: Normal range of motion.     Cervical back: Normal range of motion and neck supple.     Right lower leg: No edema.     Left lower leg: No edema.  Lymphadenopathy:     Cervical: No cervical adenopathy.   Skin:    General: Skin is warm and dry.     Findings: No erythema or rash.   Neurological:     Mental Status: She is alert and oriented to person, place, and time.     Cranial Nerves: No cranial  nerve deficit.     Deep Tendon Reflexes: Reflexes are normal and symmetric.   Psychiatric:        Mood and Affect: Mood normal.        Behavior: Behavior normal.        Thought Content: Thought content normal.        Judgment: Judgment normal.      No results found for any visits on 07/04/23.  Last CBC Lab Results  Component Value Date   WBC 5.2 06/18/2022   HGB 13.6 06/18/2022   HCT 41.4 06/18/2022   MCV 97.7 06/18/2022   MCH 31.1 04/18/2020   RDW 13.1 06/18/2022   PLT 243.0 06/18/2022   Last metabolic panel Lab Results  Component Value Date   GLUCOSE 129 (H) 11/11/2022   NA 138 11/11/2022   K 4.4 11/11/2022   CL  100 11/11/2022   CO2 25 11/11/2022   BUN 13 11/11/2022   CREATININE 0.92 11/11/2022   EGFR 71 11/11/2022   CALCIUM  9.5 11/11/2022   PROT 6.7 06/18/2022   ALBUMIN 4.1 06/18/2022   LABGLOB 2.8 05/14/2018   BILITOT 0.4 06/18/2022   ALKPHOS 106 06/18/2022   AST 19 06/18/2022   ALT 17 06/18/2022   ANIONGAP 9 04/18/2020   Last lipids Lab Results  Component Value Date   CHOL 170 05/23/2023   HDL 74 05/23/2023   LDLCALC 75 05/23/2023   LDLDIRECT 140.9 12/18/2011   TRIG 118 05/23/2023   CHOLHDL 2.3 05/23/2023   Last hemoglobin A1c Lab Results  Component Value Date   HGBA1C 5.2 02/23/2018   Last thyroid  functions Lab Results  Component Value Date   TSH 4.01 06/18/2022   Last vitamin D  Lab Results  Component Value Date   VD25OH 46.19 05/25/2021   Last vitamin B12 and Folate Lab Results  Component Value Date   VITAMINB12 433 02/23/2018   FOLATE > 20.0 ng/mL 02/03/2008      The 10-year ASCVD risk score (Arnett DK, et al., 2019) is: 2.2%    Assessment & Plan:   Problem List Items Addressed This Visit       Unprioritized   Vitamin D  deficiency   Relevant Orders   VITAMIN D  25 Hydroxy (Vit-D Deficiency, Fractures)   Morbid obesity (HCC)   Iron deficiency anemia   Relevant Orders   CBC with Differential/Platelet   Hyperlipidemia   Relevant Orders   Comprehensive metabolic panel with GFR   Lipid panel   Cold sore   Relevant Medications   valACYclovir  (VALTREX ) 500 MG tablet   Preventative health care - Primary   Health Maintenance  Topic Date Due   COVID-19 Vaccine (4 - 2024-25 season) 07/20/2023 (Originally 09/08/2022)   Pneumococcal Vaccine 22-63 Years old (2 of 2 - PCV) 07/03/2024 (Originally 05/12/2015)   INFLUENZA VACCINE  08/08/2023   MAMMOGRAM  12/29/2023   Cervical Cancer Screening (HPV/Pap Cotest)  07/02/2027   DTaP/Tdap/Td (3 - Td or Tdap) 10/29/2028   Colonoscopy  08/28/2031   Hepatitis C Screening  Completed   HIV Screening  Completed   Zoster  Vaccines- Shingrix  Completed   Hepatitis B Vaccines  Aged Out   HPV VACCINES  Aged Out   Meningococcal B Vaccine  Aged Out   Ghm utd Check labs  See AVS       Relevant Orders   CBC with Differential/Platelet   Comprehensive metabolic panel with GFR   Lipid panel   TSH   Other Visit Diagnoses  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HCC)         B12 deficiency       Relevant Orders   Vitamin B12     Assessment and Plan Assessment & Plan Hyperlipidemia   Crestor  has effectively reduced LDL levels from 146 to 75, but she experiences severe joint pain in her wrists, ankles, tops of feet, and grip, a common statin side effect. She will discuss with Dr. Katina about adjusting the Crestor  dosage if the joint pain persists. Lifestyle modifications, including diet and exercise, are being implemented to further manage cholesterol. CoQ10 was discussed as a potential supplement for joint pain, though its efficacy is debated. Cholesterol levels will be monitored in a few months after lifestyle changes.  Delayed Skin Healing   She reports delayed epidermal healing after scrapes or cuts, ongoing for a year or two, and plans to revisit this issue with her dermatologist. Comprehensive labs, including thyroid , vitamin D , B12, and other basic labs, will be ordered to assess potential nutrient absorption issues, as diarrhea has improved with Crestor .  General Health Maintenance   She is up to date with her colonoscopy until 2033 and is in the process of establishing care with a new gynecologist, Dr. Babetta. She is considering the pneumonia vaccine for those over 50. Regular dental and ophthalmologic visits will continue.  Follow-up   She is managing her health with multiple specialists and will consider follow-up with her cardiologist regarding statin side effects if joint pain persists. A follow-up appointment will be scheduled after lifestyle modifications to reassess cholesterol levels.    No  follow-ups on file.    Orvin Netter R [name], DO

## 2023-07-06 ENCOUNTER — Ambulatory Visit: Payer: Self-pay | Admitting: Family Medicine

## 2023-07-06 DIAGNOSIS — E785 Hyperlipidemia, unspecified: Secondary | ICD-10-CM

## 2023-07-25 ENCOUNTER — Other Ambulatory Visit (INDEPENDENT_AMBULATORY_CARE_PROVIDER_SITE_OTHER)

## 2023-07-25 DIAGNOSIS — E785 Hyperlipidemia, unspecified: Secondary | ICD-10-CM | POA: Diagnosis not present

## 2023-07-25 LAB — BASIC METABOLIC PANEL WITH GFR
BUN: 11 mg/dL (ref 6–23)
CO2: 29 meq/L (ref 19–32)
Calcium: 9.4 mg/dL (ref 8.4–10.5)
Chloride: 104 meq/L (ref 96–112)
Creatinine, Ser: 0.9 mg/dL (ref 0.40–1.20)
GFR: 68.64 mL/min (ref >60.00)
Glucose, Bld: 95 mg/dL (ref 70–99)
Potassium: 4.3 meq/L (ref 3.5–5.1)
Sodium: 140 meq/L (ref 135–145)

## 2023-08-01 ENCOUNTER — Ambulatory Visit: Payer: Self-pay | Admitting: Family Medicine

## 2023-10-03 ENCOUNTER — Other Ambulatory Visit: Payer: Self-pay | Admitting: Family Medicine

## 2023-10-03 DIAGNOSIS — Z1231 Encounter for screening mammogram for malignant neoplasm of breast: Secondary | ICD-10-CM

## 2023-10-09 ENCOUNTER — Ambulatory Visit: Payer: Self-pay

## 2023-10-09 NOTE — Telephone Encounter (Signed)
 Appt scheduled

## 2023-10-09 NOTE — Telephone Encounter (Signed)
 FYI Only or Action Required?: FYI only for provider.  Patient was last seen in primary care on 07/04/2023 by Antonio Meth, Jamee SAUNDERS, DO.  Called Nurse Triage reporting Back Pain.  Symptoms began a week ago.  Interventions attempted: Rest, hydration, or home remedies.  Symptoms are: unchanged.  Triage Disposition: See PCP Within 2 Weeks  Patient/caregiver understands and will follow disposition?: Yes   Copied from CRM [id number]. Topic: Clinical - Red Word Triage >> Oct 09, 2023  9:01 AM Jasmin G wrote: [facility] that prompted transfer to Nurse Triage: Pt states that she has been having back pain and aches accompanied by feeling very dehydrated for a couple of months now. Reason for Disposition  [1] Constipation persists > 1 week AND [2] no improvement after using Care Advice  Answer Assessment - Initial Assessment Questions Additional info:  Requesting lab work.     1. STOOL PATTERN OR FREQUENCY: How often do you have a bowel movement (BM)?  (Normal range: 3 times a day to every 3 days)  When was your last BM?       Small amounts infrequently 2. STRAINING: Do you have to strain to have a BM?      yes 3. ONSET: When did the constipation begin?     Thursday-have small infrequent bm's since Thursday 4. RECTAL PAIN: Does your rectum hurt when the stool comes out? If Yes, ask: Do you have hemorrhoids? How bad is the pain?  (Scale 1-10; or mild, moderate, severe)      5. BM COMPOSITION: Are the stools hard?      Small hard 6. BLOOD ON STOOLS: Has there been any blood on the toilet tissue or on the surface of the BM? If Yes, ask: When was the last time?     denies 7. CHRONIC CONSTIPATION: Is this a new problem for you?  If No, ask: How long have you had this problem? (days, weeks, months)      No  8. CHANGES IN DIET OR HYDRATION: Have there been any recent changes in your diet? How much fluids are you drinking on a daily basis?  How much have you had to drink  today?     Increased fluids 9. MEDICINES: Have you been taking any new medicines? Are you taking any narcotic pain medicines? (e.g., Dilaudid, morphine, Percocet, Vicodin)      10. LAXATIVES: Have you been using any stool softeners, laxatives, or enemas?  If Yes, ask What are you using, how often, and when was the last time?       none 11. ACTIVITY:  How much walking do you do every day?  Has your activity level decreased in the past week?         12. CAUSE: What do you think is causing the constipation?        Unsure  13. MEDICAL HISTORY: Do you have a history of hemorrhoids, rectal fissures, rectal surgery, or rectal abscess?          14. OTHER SYMPTOMS: Do you have any other symptoms? (e.g., abdomen pain, bloating, fever, vomiting)       Back pain intermittent-achy, feels she may be getting dehydrated but drinking plenty of fluids.  Protocols used: Constipation-A-AH

## 2023-10-10 ENCOUNTER — Encounter: Payer: Self-pay | Admitting: Family Medicine

## 2023-10-10 ENCOUNTER — Ambulatory Visit: Admitting: Family Medicine

## 2023-10-10 ENCOUNTER — Ambulatory Visit (HOSPITAL_BASED_OUTPATIENT_CLINIC_OR_DEPARTMENT_OTHER)
Admission: RE | Admit: 2023-10-10 | Discharge: 2023-10-10 | Disposition: A | Discharge status: Home / Self Care | Source: Ambulatory Visit | Attending: Family Medicine | Admitting: Family Medicine

## 2023-10-10 VITALS — BP 136/84 | HR 52 | Temp 97.9°F | Resp 18 | Ht 67.0 in | Wt 189.2 lb

## 2023-10-10 DIAGNOSIS — R82998 Other abnormal findings in urine: Secondary | ICD-10-CM | POA: Diagnosis not present

## 2023-10-10 DIAGNOSIS — M545 Low back pain, unspecified: Secondary | ICD-10-CM | POA: Diagnosis not present

## 2023-10-10 DIAGNOSIS — K5901 Slow transit constipation: Secondary | ICD-10-CM | POA: Diagnosis present

## 2023-10-10 LAB — POC URINALSYSI DIPSTICK (AUTOMATED)
Bilirubin, UA: NEGATIVE
Blood, UA: NEGATIVE
Glucose, UA: NEGATIVE
Ketones, UA: NEGATIVE
Nitrite, UA: NEGATIVE
Protein, UA: NEGATIVE
Spec Grav, UA: <=1.005 — AB (ref 1.010–1.025)
Urobilinogen, UA: 0.2 U/dL
pH, UA: 5 (ref 5.0–8.0)

## 2023-10-10 NOTE — Progress Notes (Signed)
 + ++   Subjective:    Patient ID: Jeanette Boone, female    DOB: Nov 29, 1960, 63 y.o.   MRN: [id number]  Chief Complaint  Patient presents with   Back Pain    Pt states pain is off and on but getting worse the last 2-3 weeks. Pain is lower back up to the under the shoulder blades.     HPI Patient is in today for back pain.  Discussed the use of AI scribe software for clinical note transcription with the patient, who gave verbal consent to proceed.  History of Present Illness Jeanette Boone is a 63 year old female who presents with back pain and concerns about kidney issues.  She experiences a dull, sometimes severe ache originating in the lower back and radiating to the shoulder blades. The pain is not related to food intake, and she denies any history of kidney stones. This is the first occurrence of such pain.  She has a history of high potassium levels noted in May, which normalized upon retesting. Today, her blood pressure is higher than usual, although she typically does not have high blood pressure.  She feels dehydrated over the past week and a half, with constant thirst. She consumes about six cups of black tea daily and 40 to 60 ounces of water.  She experiences constipation, with a severe episode a week ago where she did not have a bowel movement for five days. Since then, she has had very hard stools and lacks daily bowel movements. She has not taken any medication for constipation.  She is taking 10 mg of a medication she refers to as 'rutoside' for soft plaque as a preventative measure, but experiences significant joint pain as a side effect. Her LDL levels have significantly decreased since starting the medication.    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   Thrombocytopenia, secondary    secondary to valtrex     Past Surgical History:  Procedure Laterality Date   BREAST LUMPECTOMY Right 2010   BREAST SURGERY  05/2008   lumpectomy - right   CHOLECYSTECTOMY  05/2008   COLONOSCOPY  08/2009   HERNIA REPAIR  1995   umbilical   HERNIA REPAIR  1996   LAPAROSCOPY     port a cath insertion  06/2008   uterine polyp removal  06/2008    Family History  Problem Relation Age of Onset   Congestive Heart Failure Mother    Hypertension Mother    Heart disease Father        pacemaker----tachycardia   Congestive Heart Failure Father 54   Stroke Maternal Grandmother    Bladder Cancer Maternal Grandmother        dx in her 86s   Heart disease Maternal Grandfather 22       MI   Stroke Maternal Grandfather    Heart disease Paternal Grandfather 51       MI   Breast cancer Other        MGF's sister    Social History   Socioeconomic History   Marital status: Married    Spouse name: donald   Number of children: 0  Years of education: college   Highest education level: Master's degree (e.g., MA, MS, MEng, MEd, MSW, MBA)  Occupational History   Occupation: Psychologist, prison and probation services.    Employer: [employer]    Comment: Full Time  Tobacco Use   Smoking status: Former   Smokeless tobacco: Never  Building services engineer status: Never Used  Substance and Sexual Activity   Alcohol use: Not Currently    Comment: 0   Drug use: Never   Sexual activity: Yes    Partners: Male    Comment: refused to answer insurance questions  Other Topics Concern   Not on file  Social History Narrative   Lives with husband and son   Patient is right handed.   Patient drinks 4-5 cups caffeine daily   Walking some    Social Drivers of Health   Financial Resource Strain: Low Risk  (10/09/2023)   Overall Financial Resource Strain (CARDIA)    Difficulty of Paying Living Expenses: Not hard at all  Food Insecurity: No Food  Insecurity (10/09/2023)   Hunger Vital Sign    Worried About Running Out of Food in the Last Year: Never true    Ran Out of Food in the Last Year: Never true  Transportation Needs: No Transportation Needs (10/09/2023)   PRAPARE - Administrator, Civil Service (Medical): No    Lack of Transportation (Non-Medical): No  Physical Activity: Sufficiently Active (10/09/2023)   Exercise Vital Sign    Days of Exercise per Week: 4 days    Minutes of Exercise per Session: 60 min  Stress: No Stress Concern Present (10/09/2023)   [employer] of Occupational Health - Occupational Stress Questionnaire    Feeling of Stress: Only a little  Social Connections: Moderately Integrated (10/09/2023)   Social Connection and Isolation Panel    Frequency of Communication with Friends and Family: Twice a week    Frequency of Social Gatherings with Friends and Family: Once a week    Attends Religious Services: 1 to 4 times per year    Active Member of [employer] or Organizations: No    Attends Engineer, structural: Not on file    Marital Status: Married  Catering manager Violence: Not on file    Outpatient Medications Prior to Visit  Medication Sig Dispense Refill   B Complex Vitamins (B-COMPLEX/B-12 PO) Take 1 tablet by mouth daily.      Biotin 1000 MCG tablet Take 1,000 mcg by mouth daily.      cholecalciferol (VITAMIN D ) 1000 UNITS tablet Take 2,000 Units by mouth daily.     DULoxetine  (CYMBALTA ) 30 MG capsule TAKE 1 CAPSULE BY MOUTH EVERY DAY 90 capsule 4   gabapentin  (NEURONTIN ) 300 MG capsule TAKE 3 CAPSULES BY MOUTH FOUR TIMES DAILY 1080 capsule 4   Multiple Vitamin (MULTIVITAMIN) tablet Take 1 tablet by mouth daily.     rosuvastatin  (CRESTOR ) 10 MG tablet Take 1 tablet (10 mg total) by mouth daily. 90 tablet 3   valACYclovir  (VALTREX ) 500 MG tablet Take 1 tablet (500 mg total) by mouth daily. 90 tablet 3   No facility-administered medications prior to visit.    Allergies   Allergen Reactions   Clarithromycin     Thrush   Other     Steroids---Eye pressure   Penicillins Other (See Comments)    Child hood Did it involve swelling of the face/tongue/throat, SOB, or low BP? Unknown Did it involve sudden or severe rash/hives, skin peeling,  or any reaction on the inside of your mouth or nose? Unknown Did you need to seek medical attention at a hospital or doctor's office? Unknown When did it last happen?       If all above answers are "NO", may proceed with cephalosporin use.      Review of Systems  Constitutional:  Negative for fever and malaise/fatigue.  HENT:  Negative for congestion.   Eyes:  Negative for blurred vision.  Respiratory:  Negative for cough and shortness of breath.   Cardiovascular:  Negative for chest pain, palpitations and leg swelling.  Gastrointestinal:  Negative for vomiting.  Musculoskeletal:  Negative for back pain.  Skin:  Negative for rash.  Neurological:  Negative for loss of consciousness and headaches.       Objective:    Physical Exam Vitals and nursing note reviewed.  Constitutional:      General: She is not in acute distress.    Appearance: Normal appearance. She is well-developed.  HENT:     Head: Normocephalic and atraumatic.  Eyes:     General: No scleral icterus.       Right eye: No discharge.        Left eye: No discharge.  Cardiovascular:     Rate and Rhythm: Normal rate and regular rhythm.     Heart sounds: No murmur heard. Pulmonary:     Effort: Pulmonary effort is normal. No respiratory distress.     Breath sounds: Normal breath sounds.  Abdominal:     General: Bowel sounds are normal.     Tenderness: There is abdominal tenderness. There is right CVA tenderness and left CVA tenderness. There is no guarding or rebound.  Musculoskeletal:        General: Tenderness present. Normal range of motion.     Cervical back: Normal range of motion and neck supple.     Lumbar back: Tenderness present. No  spasms. Negative right straight leg raise test and negative left straight leg raise test.     Right lower leg: No tenderness. No edema.     Left lower leg: No tenderness. No edema.  Skin:    General: Skin is warm and dry.  Neurological:     Mental Status: She is alert and oriented to person, place, and time.  Psychiatric:        Mood and Affect: Mood normal.        Behavior: Behavior normal.        Thought Content: Thought content normal.        Judgment: Judgment normal.     BP 136/84 (BP Location: Left Arm, Patient Position: Sitting, Cuff Size: Large)   Pulse (!) 52   Temp 97.9 F (36.6 C) (Oral)   Resp 18   Ht 5' 7 (1.702 m)   Wt 189 lb 3.2 oz (85.8 kg)   LMP 05/13/2010   SpO2 100%   BMI 29.63 kg/m  Wt Readings from Last 3 Encounters:  10/10/23 189 lb 3.2 oz (85.8 kg)  07/04/23 218 lb 12.8 oz (99.2 kg)  01/14/23 222 lb (100.7 kg)    Diabetic Foot Exam - Simple   No data filed    Lab Results  Component Value Date   WBC 4.4 07/04/2023   HGB 14.2 07/04/2023   HCT 42.8 07/04/2023   PLT 171.0 07/04/2023   GLUCOSE 95 07/25/2023   CHOL 127 07/04/2023   TRIG 81.0 07/04/2023   HDL 59.50 07/04/2023   LDLDIRECT 140.9 12/18/2011  LDLCALC 51 07/04/2023   ALT 20 07/04/2023   AST 23 07/04/2023   NA 140 07/25/2023   K 4.3 07/25/2023   CL 104 07/25/2023   CREATININE 0.90 07/25/2023   BUN 11 07/25/2023   CO2 29 07/25/2023   TSH 2.43 07/04/2023   HGBA1C 5.2 02/23/2018    Lab Results  Component Value Date   TSH 2.43 07/04/2023   Lab Results  Component Value Date   WBC 4.4 07/04/2023   HGB 14.2 07/04/2023   HCT 42.8 07/04/2023   MCV 95.9 07/04/2023   PLT 171.0 07/04/2023   Lab Results  Component Value Date   NA 140 07/25/2023   K 4.3 07/25/2023   CHLORIDE 102 05/30/2016   CO2 29 07/25/2023   GLUCOSE 95 07/25/2023   BUN 11 07/25/2023   CREATININE 0.90 07/25/2023   BILITOT 0.6 07/04/2023   ALKPHOS 104 07/04/2023   AST 23 07/04/2023   ALT 20 07/04/2023    PROT 7.0 07/04/2023   ALBUMIN 4.4 07/04/2023   CALCIUM  9.4 07/25/2023   ANIONGAP 9 04/18/2020   EGFR 71 11/11/2022   GFR 68.64 07/25/2023   Lab Results  Component Value Date   CHOL 127 07/04/2023   Lab Results  Component Value Date   HDL 59.50 07/04/2023   Lab Results  Component Value Date   LDLCALC 51 07/04/2023   Lab Results  Component Value Date   TRIG 81.0 07/04/2023   Lab Results  Component Value Date   CHOLHDL 2 07/04/2023   Lab Results  Component Value Date   HGBA1C 5.2 02/23/2018       Assessment & Plan:  Acute bilateral low back pain without sciatica -     POCT Urinalysis Dipstick (Automated) -     CBC with Differential/Platelet -     Comprehensive metabolic panel with GFR  Slow transit constipation -     DG Abd 2 Views; Future  Leukocytes in urine -     Urine Culture  Assessment and Plan Assessment & Plan Chronic back pain and constipation   She experiences chronic back pain with severe episodes extending from the lower back to the shoulder blades. Constipation is present with recent severe pain during bowel movements, possibly worsened by dehydration and high tea consumption. Kidney issues are considered in the differential, but there is no history of kidney stones. Constipation may contribute to the back pain. Order urine analysis and blood work to assess kidney function. Order an abdominal x-ray to evaluate for constipation. Advise increasing water intake and reducing black tea consumption. Suggest using Senokot or Miralax for constipation management.  Dehydration   She has recent dehydration with constant thirst, possibly due to high black tea consumption acting as a diuretic and inadequate water intake. Advise increasing water intake to at least 8 glasses per day and recommend reducing black tea consumption.  Hyperlipidemia with statin-associated arthralgia   Her hyperlipidemia is managed with rosuvastatin , resulting in significant LDL  reduction. She experiences joint pain potentially related to statin use. Consider CoQ10 supplementation to alleviate joint pain. Continue rosuvastatin  therapy. Plan follow-up with PA in cardiology for further management.    Sherrol Vicars R [name], DO

## 2023-10-11 LAB — COMPREHENSIVE METABOLIC PANEL WITH GFR
AG Ratio: 1.8 (calc) (ref 1.0–2.5)
ALT: 19 U/L (ref 6–29)
AST: 22 U/L (ref 10–35)
Albumin: 4.2 g/dL (ref 3.6–5.1)
Alkaline phosphatase (APISO): 102 U/L (ref 37–153)
BUN: 10 mg/dL (ref 7–25)
CO2: 27 mmol/L (ref 20–32)
Calcium: 9.1 mg/dL (ref 8.6–10.4)
Chloride: 98 mmol/L (ref 98–110)
Creat: 0.79 mg/dL (ref 0.50–1.05)
Globulin: 2.3 g/dL (ref 1.9–3.7)
Glucose, Bld: 82 mg/dL (ref 65–99)
Potassium: 4.1 mmol/L (ref 3.5–5.3)
Sodium: 132 mmol/L — ABNORMAL LOW (ref 135–146)
Total Bilirubin: 0.8 mg/dL (ref 0.2–1.2)
Total Protein: 6.5 g/dL (ref 6.1–8.1)
eGFR: 85 mL/min/1.73m2 (ref >=60)

## 2023-10-11 LAB — URINE CULTURE
MICRO NUMBER:: 17053980
Result:: NO GROWTH
SPECIMEN QUALITY:: ADEQUATE

## 2023-10-11 LAB — CBC WITH DIFFERENTIAL/PLATELET
Absolute Lymphocytes: 2470 {cells}/uL (ref 850–3900)
Absolute Monocytes: 413 {cells}/uL (ref 200–950)
Basophils Absolute: 39 {cells}/uL (ref 0–200)
Basophils Relative: 0.7 %
Eosinophils Absolute: 83 {cells}/uL (ref 15–500)
Eosinophils Relative: 1.5 %
HCT: 40.5 % (ref 35.0–45.0)
Hemoglobin: 13.2 g/dL (ref 11.7–15.5)
MCH: 31.4 pg (ref 27.0–33.0)
MCHC: 32.6 g/dL (ref 32.0–36.0)
MCV: 96.4 fL (ref 80.0–100.0)
MPV: 13.1 fL — ABNORMAL HIGH (ref 7.5–12.5)
Monocytes Relative: 7.5 %
Neutro Abs: 2497 {cells}/uL (ref 1500–7800)
Neutrophils Relative %: 45.4 %
Platelets: 150 Thousand/uL (ref 140–400)
RBC: 4.2 Million/uL (ref 3.80–5.10)
RDW: 13.2 % (ref 11.0–15.0)
Total Lymphocyte: 44.9 %
WBC: 5.5 Thousand/uL (ref 3.8–10.8)

## 2023-10-12 ENCOUNTER — Ambulatory Visit: Payer: Self-pay | Admitting: Family Medicine

## 2023-10-15 ENCOUNTER — Telehealth: Payer: Self-pay

## 2023-10-15 NOTE — Telephone Encounter (Signed)
 Called and left voicemail for patient to reschedule appointment on 1/7 with Dr Ines.  If patient calls back, they can be rescheduled with Dr Onita

## 2023-10-15 NOTE — Telephone Encounter (Signed)
 Imaging not reviewed by radiologist yet

## 2023-10-15 NOTE — Telephone Encounter (Signed)
 Copied from CRM #[id number]. Topic: Clinical - Lab/Test Results >> Oct 15, 2023 12:58 PM Harlene ORN wrote: Reason for CRM: was calling regarding an x-ray she took on friday 10/03 still waiting for results. Please call back.

## 2023-11-20 ENCOUNTER — Ambulatory Visit

## 2023-11-24 ENCOUNTER — Ambulatory Visit: Admitting: Physician Assistant

## 2023-12-02 ENCOUNTER — Ambulatory Visit: Admitting: Physician Assistant

## 2023-12-02 ENCOUNTER — Encounter: Payer: Self-pay | Admitting: General Practice

## 2023-12-02 ENCOUNTER — Ambulatory Visit
Admission: RE | Admit: 2023-12-02 | Discharge: 2023-12-02 | Disposition: A | Discharge status: Home / Self Care | Source: Ambulatory Visit | Attending: Family Medicine | Admitting: Family Medicine

## 2023-12-02 ENCOUNTER — Ambulatory Visit: Attending: General Practice | Admitting: General Practice

## 2023-12-02 VITALS — BP 120/72 | HR 54 | Ht 67.0 in | Wt 169.6 lb

## 2023-12-02 DIAGNOSIS — R072 Precordial pain: Secondary | ICD-10-CM | POA: Diagnosis not present

## 2023-12-02 DIAGNOSIS — E782 Mixed hyperlipidemia: Secondary | ICD-10-CM

## 2023-12-02 DIAGNOSIS — Z1231 Encounter for screening mammogram for malignant neoplasm of breast: Secondary | ICD-10-CM

## 2023-12-02 DIAGNOSIS — R0609 Other forms of dyspnea: Secondary | ICD-10-CM

## 2023-12-02 MED ORDER — ROSUVASTATIN CALCIUM 10 MG PO TABS: 10.0000 mg | ORAL_TABLET | Freq: Every day | ORAL | 3 refills | Status: AC

## 2023-12-02 NOTE — Progress Notes (Deleted)
  Cardiology Office Note   Date:  12/02/2023  ID:  Jeanette Boone, DOB December 22, 1960, MRN [id number] PCP: Antonio Meth, Jamee SAUNDERS, DO  [facility] Providers Cardiologist:  None   History of Present Illness Jeanette Boone is a 63 y.o. female with past medical history of breast cancer and sensory neuropathy who presented 11//24 with chest discomfort and shortness of breath.  She is now here for annual follow-up appointment.  Discomfort was described as a pressure sensation which was present at the time of office visit and she has been experiencing previously when the patient was heavier.  She also reported occasional palpitations describing them as a fluttering sensation in her chest.  She has significant family history of heart disease with both parents having pacemakers and her father having passed away from congestive heart failure.  Patient has history of being a yoyo diet or throughout her life and weight at 189 pounds at her last office visit which puts her in the obese category.  She experienced severe heartburn for about 2 months which is manage with Prilosec and since improved.  She is also on high-dose of gabapentin  for sensory neuropathy which she describes an intense sensation throughout the body.  Patient has a history of breast cancer diagnosed in 2010 and underwent lumpectomy, chemotherapy, and radiation.  Also reported a fainting episode in 2020 which led to a CT scan of the chest.  She did not have a history of hypertension, diabetes, or smoking but had elevated cholesterol levels.  She had not been exercising regularly for the past year due to personal circumstances.  No new medications added to the regimen recently denies any symptoms or changes in her health since the previous visit.  Today, she***  ROS: ***  Studies Reviewed     Coronary CT scan 11/28/2022  IMPRESSION: 1. Coronary calcium  score of 0.   2. Total plaque volume (TPV) 15 mm3 which is 28th percentile  for age- and sex-matched controls (calcified plaque 0 mm3; non-calcified plaque 15 mm3 (low attenuation plaque 4 mm3)). TPV is mild.   3.  Normal coronary origin with right dominance.   4.  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   5.  Small patent foramen ovale present.   RECOMMENDATIONS:   Consider non-atherosclerotic causes of chest pain. Consider preventive therapy and risk factor modification. Risk Assessment/Calculations {Does this patient have ATRIAL FIBRILLATION?:[phone]} No BP recorded.  {Refresh Note OR Click here to enter BP  :1}***       Physical Exam VS:  LMP 05/13/2010        Wt Readings from Last 3 Encounters:  10/10/23 189 lb 3.2 oz (85.8 kg)  07/04/23 218 lb 12.8 oz (99.2 kg)  01/14/23 222 lb (100.7 kg)    GEN: Well nourished, well developed in no acute distress NECK: No JVD; No carotid bruits CARDIAC: ***RRR, no murmurs, rubs, gallops RESPIRATORY:  Clear to auscultation without rales, wheezing or rhonchi  ABDOMEN: Soft, non-tender, non-distended EXTREMITIES:  No edema; No deformity   ASSESSMENT AND PLAN Chest pain with recent coronary calcium  score of 0 GERD Hyperlipidemia Sensory neuropathy Breast cancer Obesity Palpitations    {Are you ordering a CV Procedure (e.g. stress test, cath, DCCV, TEE, etc)?   Press F2        :[id number]}  Dispo: ***  Signed, Orren LOISE Fabry, PA-C

## 2023-12-02 NOTE — Patient Instructions (Signed)
 Medication Instructions:  Continue Crestor  until the end of the year (Jan 1st) then take every other day *If you need a refill on your cardiac medications before your next appointment, please call your pharmacy*  Lab Work: Fasting lipid panel, LFTs in January If you have labs (blood work) drawn today and your tests are completely normal, you will receive your results only by: MyChart Message (if you have MyChart) OR A paper copy in the mail If you have any lab test that is abnormal or we need to change your treatment, we will call you to review the results.  Testing/Procedures: NONE  Follow-Up: At [facility], you and your health needs are our priority.  As part of our continuing mission to provide you with exceptional heart care, our providers are all part of one team.  This team includes your primary Cardiologist (physician) and Advanced Practice Providers or APPs (Physician Assistants and Nurse Practitioners) who all work together to provide you with the care you need, when you need it.  Your next appointment:   1 year(s)  Provider:   Jeffrie, MD Emelia, NP Jeanette Boone  We recommend signing up for the patient portal called MyChart.  Sign up information is provided on this After Visit Summary.  MyChart is used to connect with patients for Virtual Visits (Telemedicine).  Patients are able to view lab/test results, encounter notes, upcoming appointments, etc.  Non-urgent messages can be sent to your provider as well.   To learn more about what you can do with MyChart, go to [email or web address].

## 2023-12-02 NOTE — Progress Notes (Signed)
 Cardiology Clinic Note   Patient Name: Jeanette Boone Date of Encounter: 12/02/2023  Primary Care Provider:  Antonio Meth, Jamee SAUNDERS, DO Primary Cardiologist:  None  Patient Profile    Jeanette Boone 63 year old female presents the clinic today for follow-up evaluation of her precordial pain and hyperlipidemia.    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 Unknown Did it involve sudden or severe rash/hives, skin peeling, or any reaction on the inside of your mouth or nose? Unknown Did you need to seek medical attention at a hospital or doctor's office? Unknown When did it last happen?       If all above answers are "NO", may proceed with cephalosporin use.      History of Present Illness    Jeanette Boone has a PMH of breast cancer, sensory neuropathy, precordial pain, mixed hyperlipidemia.  She has a family history of both parents having pacemakers and with her father passing away from congestive heart failure.  She was seen in an establish care with Dr. Jeffrie 11/11/2022.  During that time she reported that she had  been a yoyo diet her throughout her life and was trying to lose weight.  She also had experienced severe heartburn for about 2 months.  This was managed with Prilosec and she reported improvement.  She was taking gabapentin  for sensory neuropathy.  She described intense neuropathy throughout her body.  She noted that she had breast cancer which was diagnosed in 2010.  She underwent lumpectomy radiation and chemotherapy.  She did have a fainting episode in 2020.  This led to CT of her chest.  She reported that she had not been exercising regularly over the prior year.  A coronary CTA was ordered and showed a coronary calcium  score of 0.  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  She was also noted to have mild stenosis 25-49% soft plaque in her proximal/mid LAD.  BMP was also drawn at that time and showed a glucose of 129 and stable renal function and potassium of 4.4.  She presents to the clinic today for follow-up evaluation and states she continues to be very physically active walking 5 to 6 miles per day and also doing resistance training.  She does note occasional episodes of dizziness.  We reviewed her previous clinic visit with Dr. Jeffrie and  her coronary CTA.  She expressed understanding.  She reports that she does have some arthralgias with rosuvastatin .  She has remained compliant with the medication.  She asks for alternative dosing with the therapy.  She also reports that her brother and sister are also having issues with coronary artery disease.  At this time she wishes to continue to take rosuvastatin  10 mg daily.  I recommended that she move to every other day dosing at the end of the year and we will repeat her fasting lipids and LFTs around the end of January beginning of February.  I will refill her rosuvastatin  and plan follow-up in 12 months.  Today she denies chest pain, shortness of breath, lower extremity edema, fatigue, palpitations, melena, hematuria,  hemoptysis, diaphoresis, weakness, presyncope, syncope, orthopnea, and PND.       Home Medications    Prior to Admission medications   Medication Sig Start Date End Date Taking? Authorizing Provider  B Complex Vitamins (B-COMPLEX/B-12 PO) Take 1 tablet by mouth daily.     [provider]  Biotin 1000 MCG tablet Take 1,000 mcg by mouth daily.     [provider]  cholecalciferol (VITAMIN D ) 1000 UNITS tablet Take 2,000 Units by mouth daily.    [provider]  DULoxetine  (CYMBALTA ) 30 MG capsule TAKE 1 CAPSULE BY MOUTH EVERY DAY 01/14/23   Ines Onetha NOVAK, MD  gabapentin  (NEURONTIN ) 300 MG capsule TAKE 3 CAPSULES BY MOUTH FOUR TIMES DAILY 01/14/23   Ahern, Antonia B, MD  Multiple Vitamin (MULTIVITAMIN) tablet Take 1 tablet by mouth daily.    [provider]  rosuvastatin  (CRESTOR ) 10 MG tablet Take 1 tablet (10 mg total) by mouth daily. 12/04/22   Jeffrie Oneil BROCKS, MD  valACYclovir  (VALTREX ) 500 MG tablet Take 1 tablet (500 mg total) by mouth daily. 07/04/23   Antonio Cyndee Jamee JONELLE, DO    Family History    Family History  Problem Relation Age of Onset   Congestive Heart Failure Mother    Hypertension Mother    Heart disease Father        pacemaker----tachycardia   Congestive Heart Failure Father 23   Stroke Maternal Grandmother    Bladder Cancer Maternal Grandmother        dx in her 43s   Heart disease Maternal Grandfather 81       MI   Stroke Maternal Grandfather    Heart disease Paternal Grandfather 8       MI   Breast cancer Other        MGF's sister   She indicated that her mother is alive. She indicated that her father is deceased. She indicated that her sister is alive. She indicated that both of her brothers are alive. She indicated that her maternal grandmother is deceased. She indicated that her maternal grandfather is deceased. She indicated that her paternal grandmother is deceased. She indicated that her paternal grandfather is  deceased. She indicated that her other is deceased.  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    Employer: [employer]    Comment: Full Time  Tobacco Use   Smoking status: Former   Smokeless tobacco: Never  Advertising Account Planner  Vaping status: Never Used  Substance and Sexual Activity   Alcohol use: Not Currently    Comment: 0   Drug use: Never   Sexual activity: Yes    Partners: Male    Comment: refused to answer insurance questions  Other Topics Concern   Not on file  Social History Narrative   Lives with husband and son   Patient is right handed.   Patient drinks 4-5 cups caffeine daily   Walking some    Social Drivers of Health   Financial Resource Strain: Low Risk  (10/09/2023)   Overall Financial Resource Strain (CARDIA)    Difficulty of Paying Living Expenses: Not hard at all  Food Insecurity: No Food Insecurity (10/09/2023)   Hunger Vital Sign    Worried About Running Out of Food in the Last Year: Never true    Ran Out of Food in the Last Year: Never true  Transportation Needs: No Transportation Needs (10/09/2023)   PRAPARE - Administrator, Civil Service (Medical): No    Lack of Transportation (Non-Medical): No  Physical Activity: Sufficiently Active (10/09/2023)   Exercise Vital Sign    Days of Exercise per Week: 4 days    Minutes of Exercise per Session: 60 min  Stress: No Stress Concern Present (10/09/2023)   [employer] of Occupational Health - Occupational Stress Questionnaire    Feeling of Stress: Only a little  Social Connections: Moderately Integrated (10/09/2023)   Social Connection and Isolation Panel    Frequency of Communication with Friends and Family: Twice a week     Frequency of Social Gatherings with Friends and Family: Once a week    Attends Religious Services: 1 to 4 times per year    Active Member of [employer] or Organizations: No    Attends Engineer, Structural: Not on file    Marital Status: Married  Catering Manager Violence: Not on file     Review of Systems    General:  No chills, fever, night sweats or weight changes.  Cardiovascular:  No chest pain, dyspnea on exertion, edema, orthopnea, palpitations, paroxysmal nocturnal dyspnea. Dermatological: No rash, lesions/masses Respiratory: No cough, dyspnea Urologic: No hematuria, dysuria Abdominal:   No nausea, vomiting, diarrhea, bright red blood per rectum, melena, or hematemesis Neurologic:  No visual changes, wkns, changes in mental status. All other systems reviewed and are otherwise negative except as noted above.  Physical Exam    VS:  BP 120/72 (BP Location: Left Arm, Patient Position: Sitting)   Pulse (!) 54   Ht 5' 7 (1.702 m)   Wt 169 lb 9.6 oz (76.9 kg)   LMP 05/13/2010   SpO2 98%   BMI 26.56 kg/m  , BMI Body mass index is 26.56 kg/m. GEN: Well nourished, well developed, in no acute distress. HEENT: normal. Neck: Supple, no JVD, carotid bruits, or masses. Cardiac: RRR, no murmurs, rubs, or gallops. No clubbing, cyanosis, edema.  Radials/DP/PT 2+ and equal bilaterally.  Respiratory:  Respirations regular and unlabored, clear to auscultation bilaterally. GI: Soft, nontender, nondistended, BS + x 4. MS: no deformity or atrophy. Skin: warm and dry, no rash. Neuro:  Strength and sensation are intact. Psych: Normal affect.  Accessory Clinical Findings    Recent Labs: 07/04/2023: TSH 2.43 10/10/2023: ALT 19; BUN 10; Creat 0.79; Hemoglobin 13.2; Platelets 150; Potassium 4.1; Sodium 132   Recent Lipid Panel    Component Value Date/Time   CHOL 127 07/04/2023 0917   CHOL 170 05/23/2023 0855  TRIG 81.0 07/04/2023 0917   HDL 59.50 07/04/2023 0917   HDL 74 05/23/2023  0855   CHOLHDL 2 07/04/2023 0917   VLDL 16.2 07/04/2023 0917   LDLCALC 51 07/04/2023 0917   LDLCALC 75 05/23/2023 0855   LDLDIRECT 140.9 12/18/2011 0935         ECG personally reviewed by me today- EKG Interpretation Date/Time:  Tuesday December 02 2023 10:04:25 EST Ventricular Rate:  48 PR Interval:  172 QRS Duration:  76 QT Interval:  448 QTC Calculation: 400 R Axis:   16  Text Interpretation: Sinus bradycardia When compared with ECG of 11-Nov-2022 15:24, Minimal criteria for Anterior infarct are no longer Present Confirmed by Emelia Hazy [phone]) on 12/02/2023 10:06:10 AM   Coronary CTA 11/28/2022   FINDINGS: Image quality: Average.   Artifact: Limited.   Coronary artery calcification score: Total coronary calcium  score of 0.   Coronary arteries: Normal coronary origins.  Right dominance.   Left Main Coronary Artery: Normal caliber vessel, originates from the left coronary cusp, bifurcates to form a left anterior descending artery (LAD) and a left circumflex artery (LCX), Left main is patent.   Left Anterior Descending Artery: Normal caliber vessel, reaches the apex, gives off 2 diagonal branches. Mild stenosis (25-49%) due to soft plaque in the proximal/mid LAD otherwise the LAD is patent.   First Diagonal branch: Patent.   Second Diagonal branch: Patent.   Left Circumflex Artery: Normal caliber vessel, non-dominant, travels within the atrioventricular groove, and terminates as the second obtuse marginal branches. LCx is patent.   First Obtuse Marginal branch: Patent.   Second Obtuse Marginal branch: Patent.   Right Coronary Artery: Dominant vessel, originates from the right coronary cusp, terminates as a PDA and right posterolateral branch. Minimal luminal irregularities (<24%) within the proximal to mid RCA due to soft plaque otherwise the vessel is patent.   Left Atrium: Grossly normal in size with no left atrial appendage filling defect. Small  patent foramen ovale present.   Left Ventricle: Grossly normal in size. There are no stigmata of prior infarction. There is no abnormal filling defect.   Pulmonary arteries: Normal in size without proximal filling defect.   Pulmonary veins: Three pulmonary veins on the right side and two veins on the level. Normal pulmonary venous drainage.   Aorta: Normal size, 33 mm at the mid ascending aorta (level of the PA bifurcation) measured double oblique. No calcifications. No dissection.   Pericardium: Normal thickness with no significant effusion or calcium  present.   Aortic Valve:Trileaflet without significant calcification.   Mitral Valve: Normal structure without significant calcification.   Extra-cardiac findings: See attached radiology report for non-cardiac structures.   IMPRESSION: 1. Coronary calcium  score of 0.   2. Total plaque volume (TPV) 15 mm3 which is 28th percentile for age- and sex-matched controls (calcified plaque 0 mm3; non-calcified plaque 15 mm3 (low attenuation plaque 4 mm3)). TPV is mild.   3.  Normal coronary origin with right dominance.   4.  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   5.  Small patent foramen ovale present.   RECOMMENDATIONS:   Consider non-atherosclerotic causes of chest pain. Consider preventive therapy and risk factor modification.   Electronically Signed: By: Madonna Large D.O. On: 11/30/2022 10:25   Echo- 03/29/20  IMPRESSIONS     1. Left ventricular ejection fraction, by estimation, is 55 to 60%. The  left ventricle has normal function. The left ventricle has no regional  wall motion abnormalities. There is  moderate asymmetric left ventricular  hypertrophy of the septal segment.  Left ventricular diastolic parameters are indeterminate.   2. Right ventricular systolic function is normal. The right ventricular  size is normal.   3. The mitral valve is normal in structure. No evidence of mitral valve   regurgitation. No evidence of mitral stenosis.   4. The aortic valve is normal in structure. Aortic valve regurgitation is  not visualized. No aortic stenosis is present.   5. The inferior vena cava is normal in size with greater than 50%  respiratory variability, suggesting right atrial pressure of 3 mmHg.   6. Unable to determine pulmonary artery systolic pressure, No TR jet  spectral display.   FINDINGS   Left Ventricle: Left ventricular ejection fraction, by estimation, is 55  to 60%. The left ventricle has normal function. The left ventricle has no  regional wall motion abnormalities. The left ventricular internal cavity  size was normal in size. There is   moderate asymmetric left ventricular hypertrophy of the septal segment.  Left ventricular diastolic parameters are indeterminate.   Right Ventricle: The right ventricular size is normal. No increase in  right ventricular wall thickness. Right ventricular systolic function is  normal.   Left Atrium: Left atrial size was normal in size.   Right Atrium: Right atrial size was normal in size.   Pericardium: There is no evidence of pericardial effusion. Presence of  pericardial fat pad.   Mitral Valve: The mitral valve is normal in structure. No evidence of  mitral valve regurgitation. No evidence of mitral valve stenosis.   Tricuspid Valve: The tricuspid valve is normal in structure. Tricuspid  valve regurgitation is not demonstrated. No evidence of tricuspid  stenosis.   Aortic Valve: The aortic valve is normal in structure. There is mild  aortic valve annular calcification. Aortic valve regurgitation is not  visualized. No aortic stenosis is present.   Pulmonic Valve: The pulmonic valve was normal in structure. Pulmonic valve  regurgitation is not visualized. No evidence of pulmonic stenosis.   Aorta: The aortic root is normal in size and structure.   Venous: The inferior vena cava is normal in size with greater than  50%  respiratory variability, suggesting right atrial pressure of 3 mmHg.   IAS/Shunts: No atrial level shunt detected by color flow Doppler.   Assessment & Plan   1.  Precordial pain-EKG today shows sinus bradycardia.  Denies recent episodes of exertional chest discomfort.  Reassuring coronary CTA 11/24.   Heart healthy low-sodium diet Increase physical activity as tolerated Continue rosuvastatin   Hyperlipidemia- 07/04/2023: Cholesterol 127; HDL 59.50; LDL Cholesterol 51; Triglycerides 81.0; VLDL 16.2. She does notice some arthralgia which she attributes to rosuvastatin .  In January she will switch to every other day dosing and we will repeat her fasting lipids and LFTs at the end of January beginning of February.  If her LDL cholesterol is elevated I will plan to add ezetimibe 10 mg daily to her medication regimen High-fiber diet Continue rosuvastatin   Shortness of breath/DOE-denies increased DOE and shortness of breath.  She is very physically active walking 5 to 6 miles daily and also does resistance training.SABRAHx of iron def. Anemia.  Echocardiogram 03/29/2020 showed normal LVEF and intermediate diastolic parameters.  No valvular abnormalities Heart healthy low-sodium diet Maintain physical activity Repeat echocardiogram when clinically indicated  Bradycardia-EKG today shows heart rate of 48.  She denies lightheadedness presyncope or syncope.  Blood pressure today 120/72.  Patient currently not on any beta-blocker  therapy. Continue to monitor Patient reassured  Disposition: Follow-up with Dr. Jeffrie or me in 12 months.   Josefa HERO. Michol Emory NP-C     12/02/2023, 10:44 AM [facility] [address] [place], [place] [address] Office [phone]    Notice: This dictation was prepared with Dragon dictation along with smaller phrase technology. Any transcriptional errors that result from this process are unintentional and may not be corrected upon  review.   I spent 14 minutes examining this patient, reviewing medications, and using patient centered shared decision making involving their cardiac care.   I spent  20 minutes reviewing past medical history,  medications, and prior cardiac tests.

## 2023-12-03 ENCOUNTER — Other Ambulatory Visit: Payer: Self-pay | Admitting: Family Medicine

## 2023-12-03 DIAGNOSIS — N6310 Unspecified lump in the right breast, unspecified quadrant: Secondary | ICD-10-CM

## 2023-12-30 ENCOUNTER — Ambulatory Visit
Admission: RE | Admit: 2023-12-30 | Discharge: 2023-12-30 | Disposition: A | Discharge status: Home / Self Care | Source: Ambulatory Visit | Attending: Family Medicine | Admitting: Family Medicine

## 2023-12-30 DIAGNOSIS — N6310 Unspecified lump in the right breast, unspecified quadrant: Secondary | ICD-10-CM

## 2024-01-12 ENCOUNTER — Telehealth: Payer: Self-pay | Admitting: Neurology

## 2024-01-12 NOTE — Telephone Encounter (Signed)
 Pt called to Cancel appt Due to  Conflict , appt  Rescheduled

## 2024-01-13 ENCOUNTER — Ambulatory Visit: Admitting: Neurology

## 2024-01-14 ENCOUNTER — Ambulatory Visit: Payer: Managed Care, Other (non HMO) | Admitting: Neurology

## 2024-01-15 ENCOUNTER — Other Ambulatory Visit: Payer: Self-pay | Admitting: *Deleted

## 2024-01-15 MED ORDER — DULOXETINE HCL 30 MG PO CPEP: ORAL_CAPSULE | ORAL | 4 refills | Status: AC

## 2024-01-15 MED ORDER — GABAPENTIN 300 MG PO CAPS: ORAL_CAPSULE | ORAL | 0 refills | Status: AC

## 2024-01-15 NOTE — Telephone Encounter (Signed)
 Last seen on 01/14/23 Follow up scheduled on 04/05/24   Former Dr.Ahern patient she is scheduled to see you March.  Are you willing to continue refills? Rx pending

## 2024-02-05 ENCOUNTER — Other Ambulatory Visit: Payer: Self-pay | Admitting: Obstetrics and Gynecology

## 2024-02-05 DIAGNOSIS — C50919 Malignant neoplasm of unspecified site of unspecified female breast: Secondary | ICD-10-CM

## 2024-04-05 ENCOUNTER — Ambulatory Visit: Admitting: Neurology

## 2024-04-21 ENCOUNTER — Other Ambulatory Visit
